# Patient Record
Sex: Female | Born: 1940 | Race: Black or African American | Hispanic: No | Smoking: Former smoker
Health system: Southern US, Community
[De-identification: ages and names within clinical notes are randomized; demographics above are authoritative.]

## PROBLEM LIST (undated history)

## (undated) DIAGNOSIS — I255 Ischemic cardiomyopathy: Secondary | ICD-10-CM

## (undated) DIAGNOSIS — I10 Essential (primary) hypertension: Secondary | ICD-10-CM

## (undated) DIAGNOSIS — J438 Other emphysema: Secondary | ICD-10-CM

## (undated) DIAGNOSIS — I2510 Atherosclerotic heart disease of native coronary artery without angina pectoris: Secondary | ICD-10-CM

## (undated) DIAGNOSIS — I5022 Chronic systolic (congestive) heart failure: Secondary | ICD-10-CM

## (undated) DIAGNOSIS — E785 Hyperlipidemia, unspecified: Secondary | ICD-10-CM

## (undated) DIAGNOSIS — D75829 Heparin-induced thrombocytopenia, unspecified: Secondary | ICD-10-CM

## (undated) DIAGNOSIS — D7582 Heparin induced thrombocytopenia (HIT): Secondary | ICD-10-CM

## (undated) HISTORY — DX: Essential (primary) hypertension: I10

## (undated) HISTORY — DX: Hyperlipidemia, unspecified: E78.5

## (undated) HISTORY — DX: Ischemic cardiomyopathy: I25.5

## (undated) HISTORY — DX: Atherosclerotic heart disease of native coronary artery without angina pectoris: I25.10

## (undated) HISTORY — DX: Heparin induced thrombocytopenia (HIT): D75.82

## (undated) HISTORY — DX: Heparin-induced thrombocytopenia, unspecified: D75.829

## (undated) HISTORY — DX: Chronic systolic (congestive) heart failure: I50.22

## (undated) HISTORY — DX: Other emphysema: J43.8

---

## 1998-04-23 ENCOUNTER — Emergency Department (HOSPITAL_COMMUNITY): Admission: EM | Admit: 1998-04-23 | Discharge: 1998-04-23 | Payer: Self-pay | Admitting: Emergency Medicine

## 1998-04-24 ENCOUNTER — Encounter: Admission: RE | Admit: 1998-04-24 | Discharge: 1998-07-23 | Payer: Self-pay | Admitting: Internal Medicine

## 1999-08-28 ENCOUNTER — Ambulatory Visit (HOSPITAL_COMMUNITY): Admission: RE | Admit: 1999-08-28 | Discharge: 1999-08-28 | Payer: Self-pay | Admitting: Family Medicine

## 2002-09-16 ENCOUNTER — Emergency Department (HOSPITAL_COMMUNITY): Admission: EM | Admit: 2002-09-16 | Discharge: 2002-09-16 | Payer: Self-pay | Admitting: Emergency Medicine

## 2002-12-25 ENCOUNTER — Encounter: Payer: Self-pay | Admitting: Emergency Medicine

## 2002-12-26 ENCOUNTER — Inpatient Hospital Stay (HOSPITAL_COMMUNITY): Admission: EM | Admit: 2002-12-26 | Discharge: 2002-12-30 | Payer: Self-pay | Admitting: Emergency Medicine

## 2002-12-28 ENCOUNTER — Encounter: Payer: Self-pay | Admitting: Internal Medicine

## 2003-07-17 ENCOUNTER — Emergency Department (HOSPITAL_COMMUNITY): Admission: EM | Admit: 2003-07-17 | Discharge: 2003-07-18 | Payer: Self-pay | Admitting: Emergency Medicine

## 2003-07-18 ENCOUNTER — Encounter: Payer: Self-pay | Admitting: Emergency Medicine

## 2004-07-04 ENCOUNTER — Encounter: Admission: RE | Admit: 2004-07-04 | Discharge: 2004-07-04 | Payer: Self-pay | Admitting: Internal Medicine

## 2004-07-08 ENCOUNTER — Emergency Department (HOSPITAL_COMMUNITY): Admission: EM | Admit: 2004-07-08 | Discharge: 2004-07-08 | Payer: Self-pay | Admitting: *Deleted

## 2007-11-12 ENCOUNTER — Inpatient Hospital Stay (HOSPITAL_COMMUNITY): Admission: EM | Admit: 2007-11-12 | Discharge: 2007-11-29 | Payer: Self-pay | Admitting: Internal Medicine

## 2007-11-12 ENCOUNTER — Ambulatory Visit: Payer: Self-pay | Admitting: Cardiovascular Disease

## 2007-11-15 ENCOUNTER — Encounter (INDEPENDENT_AMBULATORY_CARE_PROVIDER_SITE_OTHER): Payer: Self-pay | Admitting: Internal Medicine

## 2007-11-18 ENCOUNTER — Ambulatory Visit: Payer: Self-pay | Admitting: Thoracic Surgery (Cardiothoracic Vascular Surgery)

## 2007-11-18 ENCOUNTER — Encounter (INDEPENDENT_AMBULATORY_CARE_PROVIDER_SITE_OTHER): Payer: Self-pay | Admitting: Cardiology

## 2007-11-18 ENCOUNTER — Encounter: Payer: Self-pay | Admitting: Cardiology

## 2007-11-21 ENCOUNTER — Encounter: Payer: Self-pay | Admitting: Thoracic Surgery (Cardiothoracic Vascular Surgery)

## 2007-11-21 HISTORY — PX: CORONARY ARTERY BYPASS GRAFT: SHX141

## 2007-12-07 ENCOUNTER — Ambulatory Visit: Payer: Self-pay | Admitting: Thoracic Surgery (Cardiothoracic Vascular Surgery)

## 2007-12-09 ENCOUNTER — Ambulatory Visit: Payer: Self-pay | Admitting: Thoracic Surgery (Cardiothoracic Vascular Surgery)

## 2007-12-12 ENCOUNTER — Encounter
Admission: RE | Admit: 2007-12-12 | Discharge: 2007-12-12 | Payer: Self-pay | Admitting: Thoracic Surgery (Cardiothoracic Vascular Surgery)

## 2007-12-12 ENCOUNTER — Ambulatory Visit: Payer: Self-pay | Admitting: Thoracic Surgery (Cardiothoracic Vascular Surgery)

## 2007-12-22 ENCOUNTER — Ambulatory Visit: Payer: Self-pay | Admitting: Cardiovascular Disease

## 2007-12-22 LAB — CONVERTED CEMR LAB
Calcium: 8.8 mg/dL (ref 8.4–10.5)
Chloride: 104 meq/L (ref 96–112)
GFR calc Af Amer: 92 mL/min
GFR calc non Af Amer: 76 mL/min
Sodium: 141 meq/L (ref 135–145)

## 2007-12-26 ENCOUNTER — Ambulatory Visit: Payer: Self-pay | Admitting: Thoracic Surgery (Cardiothoracic Vascular Surgery)

## 2008-01-20 ENCOUNTER — Ambulatory Visit: Payer: Self-pay | Admitting: Cardiovascular Disease

## 2008-01-20 LAB — CONVERTED CEMR LAB
ALT: 32 units/L (ref 0–35)
AST: 32 units/L (ref 0–37)
Albumin: 3.5 g/dL (ref 3.5–5.2)
Alkaline Phosphatase: 112 units/L (ref 39–117)
BUN: 14 mg/dL (ref 6–23)
Bilirubin, Direct: 0.1 mg/dL (ref 0.0–0.3)
CO2: 29 meq/L (ref 19–32)
Cholesterol: 104 mg/dL (ref 0–200)
GFR calc Af Amer: 80 mL/min
Glucose, Bld: 102 mg/dL — ABNORMAL HIGH (ref 70–99)
Potassium: 4.4 meq/L (ref 3.5–5.1)
Total Protein: 7.5 g/dL (ref 6.0–8.3)
VLDL: 27 mg/dL (ref 0–40)

## 2008-01-23 ENCOUNTER — Ambulatory Visit: Payer: Self-pay | Admitting: Cardiovascular Disease

## 2008-04-30 ENCOUNTER — Encounter: Payer: Self-pay | Admitting: Cardiovascular Disease

## 2008-04-30 ENCOUNTER — Ambulatory Visit: Payer: Self-pay | Admitting: Cardiovascular Disease

## 2008-04-30 ENCOUNTER — Ambulatory Visit: Payer: Self-pay

## 2008-05-29 ENCOUNTER — Ambulatory Visit: Payer: Self-pay | Admitting: Cardiovascular Disease

## 2008-05-29 LAB — CONVERTED CEMR LAB
BUN: 16 mg/dL (ref 6–23)
Calcium: 9 mg/dL (ref 8.4–10.5)
Chloride: 105 meq/L (ref 96–112)
Creatinine, Ser: 1 mg/dL (ref 0.4–1.2)
GFR calc Af Amer: 71 mL/min
GFR calc non Af Amer: 58 mL/min

## 2008-06-12 ENCOUNTER — Emergency Department (HOSPITAL_COMMUNITY): Admission: EM | Admit: 2008-06-12 | Discharge: 2008-06-12 | Payer: Self-pay | Admitting: Emergency Medicine

## 2008-06-14 ENCOUNTER — Emergency Department (HOSPITAL_COMMUNITY): Admission: EM | Admit: 2008-06-14 | Discharge: 2008-06-14 | Payer: Self-pay | Admitting: Emergency Medicine

## 2008-08-20 ENCOUNTER — Ambulatory Visit: Payer: Self-pay | Admitting: Cardiovascular Disease

## 2008-08-20 LAB — CONVERTED CEMR LAB
AST: 21 units/L (ref 0–37)
Albumin: 3.3 g/dL — ABNORMAL LOW (ref 3.5–5.2)
Alkaline Phosphatase: 99 units/L (ref 39–117)
BUN: 16 mg/dL (ref 6–23)
Bilirubin, Direct: 0.1 mg/dL (ref 0.0–0.3)
CO2: 28 meq/L (ref 19–32)
Chloride: 109 meq/L (ref 96–112)
Cholesterol: 165 mg/dL (ref 0–200)
GFR calc Af Amer: 71 mL/min
Glucose, Bld: 119 mg/dL — ABNORMAL HIGH (ref 70–99)
Potassium: 4.2 meq/L (ref 3.5–5.1)
Sodium: 143 meq/L (ref 135–145)
Total Protein: 7.3 g/dL (ref 6.0–8.3)

## 2008-08-23 ENCOUNTER — Ambulatory Visit: Payer: Self-pay

## 2008-10-18 ENCOUNTER — Encounter: Admission: RE | Admit: 2008-10-18 | Discharge: 2008-10-18 | Payer: Self-pay | Admitting: Internal Medicine

## 2008-11-25 DIAGNOSIS — E785 Hyperlipidemia, unspecified: Secondary | ICD-10-CM

## 2008-11-25 DIAGNOSIS — I10 Essential (primary) hypertension: Secondary | ICD-10-CM | POA: Insufficient documentation

## 2008-11-25 DIAGNOSIS — I2510 Atherosclerotic heart disease of native coronary artery without angina pectoris: Secondary | ICD-10-CM | POA: Insufficient documentation

## 2009-03-13 ENCOUNTER — Ambulatory Visit: Payer: Self-pay | Admitting: Pulmonary Disease

## 2009-03-13 DIAGNOSIS — R0602 Shortness of breath: Secondary | ICD-10-CM

## 2009-03-21 ENCOUNTER — Encounter: Payer: Self-pay | Admitting: Cardiovascular Disease

## 2009-03-21 ENCOUNTER — Ambulatory Visit: Payer: Self-pay | Admitting: Cardiovascular Disease

## 2009-03-29 ENCOUNTER — Encounter (INDEPENDENT_AMBULATORY_CARE_PROVIDER_SITE_OTHER): Payer: Self-pay

## 2009-04-04 ENCOUNTER — Ambulatory Visit: Payer: Self-pay | Admitting: Pulmonary Disease

## 2009-04-11 ENCOUNTER — Encounter: Payer: Self-pay | Admitting: Cardiovascular Disease

## 2009-04-12 ENCOUNTER — Telehealth (INDEPENDENT_AMBULATORY_CARE_PROVIDER_SITE_OTHER): Payer: Self-pay | Admitting: *Deleted

## 2009-04-15 ENCOUNTER — Ambulatory Visit: Payer: Self-pay | Admitting: Pulmonary Disease

## 2009-04-15 DIAGNOSIS — J449 Chronic obstructive pulmonary disease, unspecified: Secondary | ICD-10-CM

## 2009-05-13 ENCOUNTER — Encounter: Payer: Self-pay | Admitting: Cardiovascular Disease

## 2009-07-11 ENCOUNTER — Ambulatory Visit: Payer: Self-pay | Admitting: Pulmonary Disease

## 2009-07-14 ENCOUNTER — Encounter: Payer: Self-pay | Admitting: Cardiovascular Disease

## 2009-08-15 ENCOUNTER — Ambulatory Visit: Payer: Self-pay | Admitting: Cardiovascular Disease

## 2009-08-15 DIAGNOSIS — I5022 Chronic systolic (congestive) heart failure: Secondary | ICD-10-CM | POA: Insufficient documentation

## 2009-08-26 ENCOUNTER — Ambulatory Visit: Payer: Self-pay | Admitting: Cardiovascular Disease

## 2009-08-27 LAB — CONVERTED CEMR LAB
ALT: 17 units/L (ref 0–35)
AST: 16 units/L (ref 0–37)
Bilirubin, Direct: 0.1 mg/dL (ref 0.0–0.3)
HDL: 28.1 mg/dL — ABNORMAL LOW (ref >39.00)
Total Bilirubin: 0.5 mg/dL (ref 0.3–1.2)
Total CHOL/HDL Ratio: 4

## 2009-09-13 ENCOUNTER — Encounter: Payer: Self-pay | Admitting: Cardiovascular Disease

## 2009-10-15 IMAGING — CR DG CHEST 2V
2 series · 2 of 2 positions shown · non-contrast
Comparison: Yesterday?s portable.

CLINICAL DATA: Asthma/post CABG.
 CHEST - 2 VIEW:

[w chest pa]
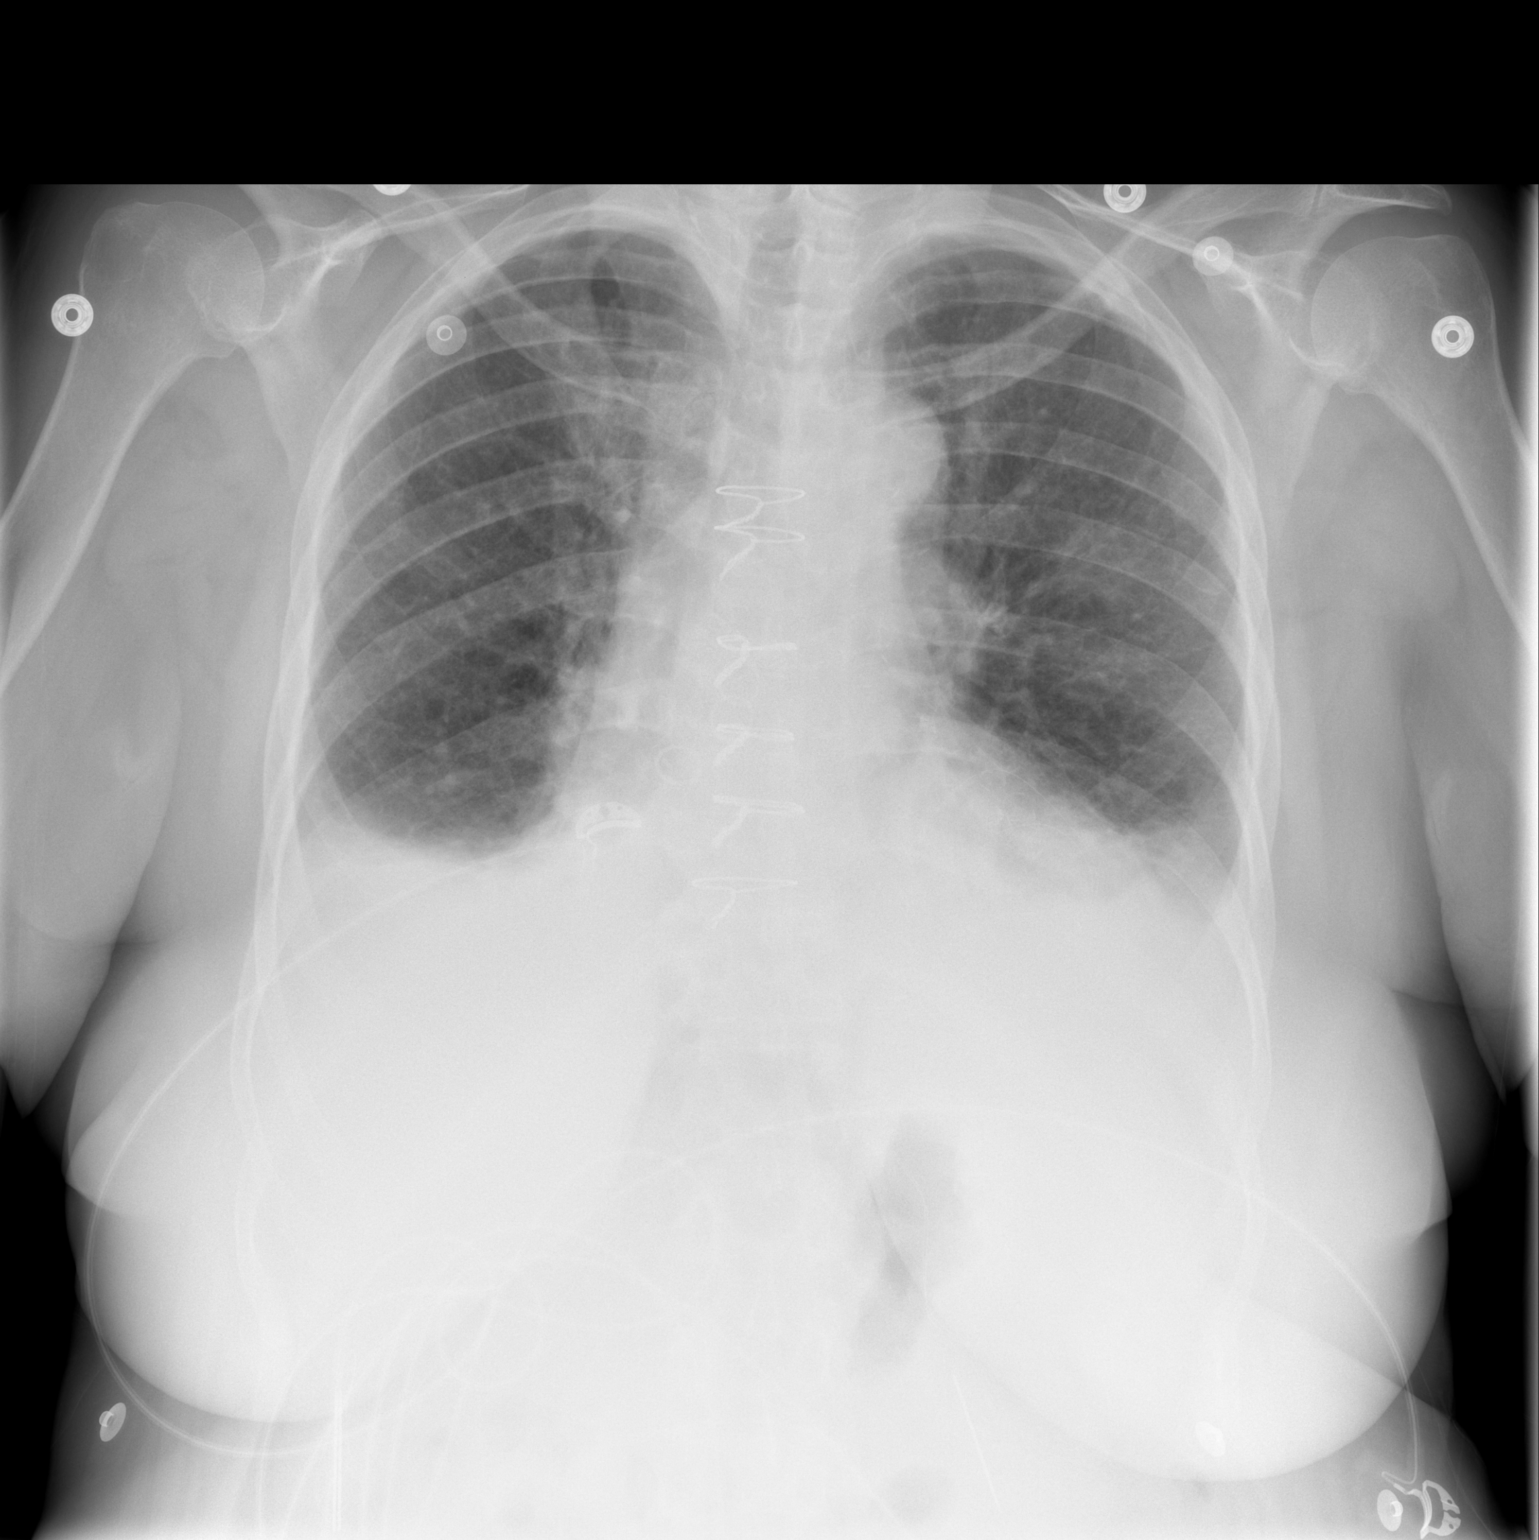

[w chest lat]
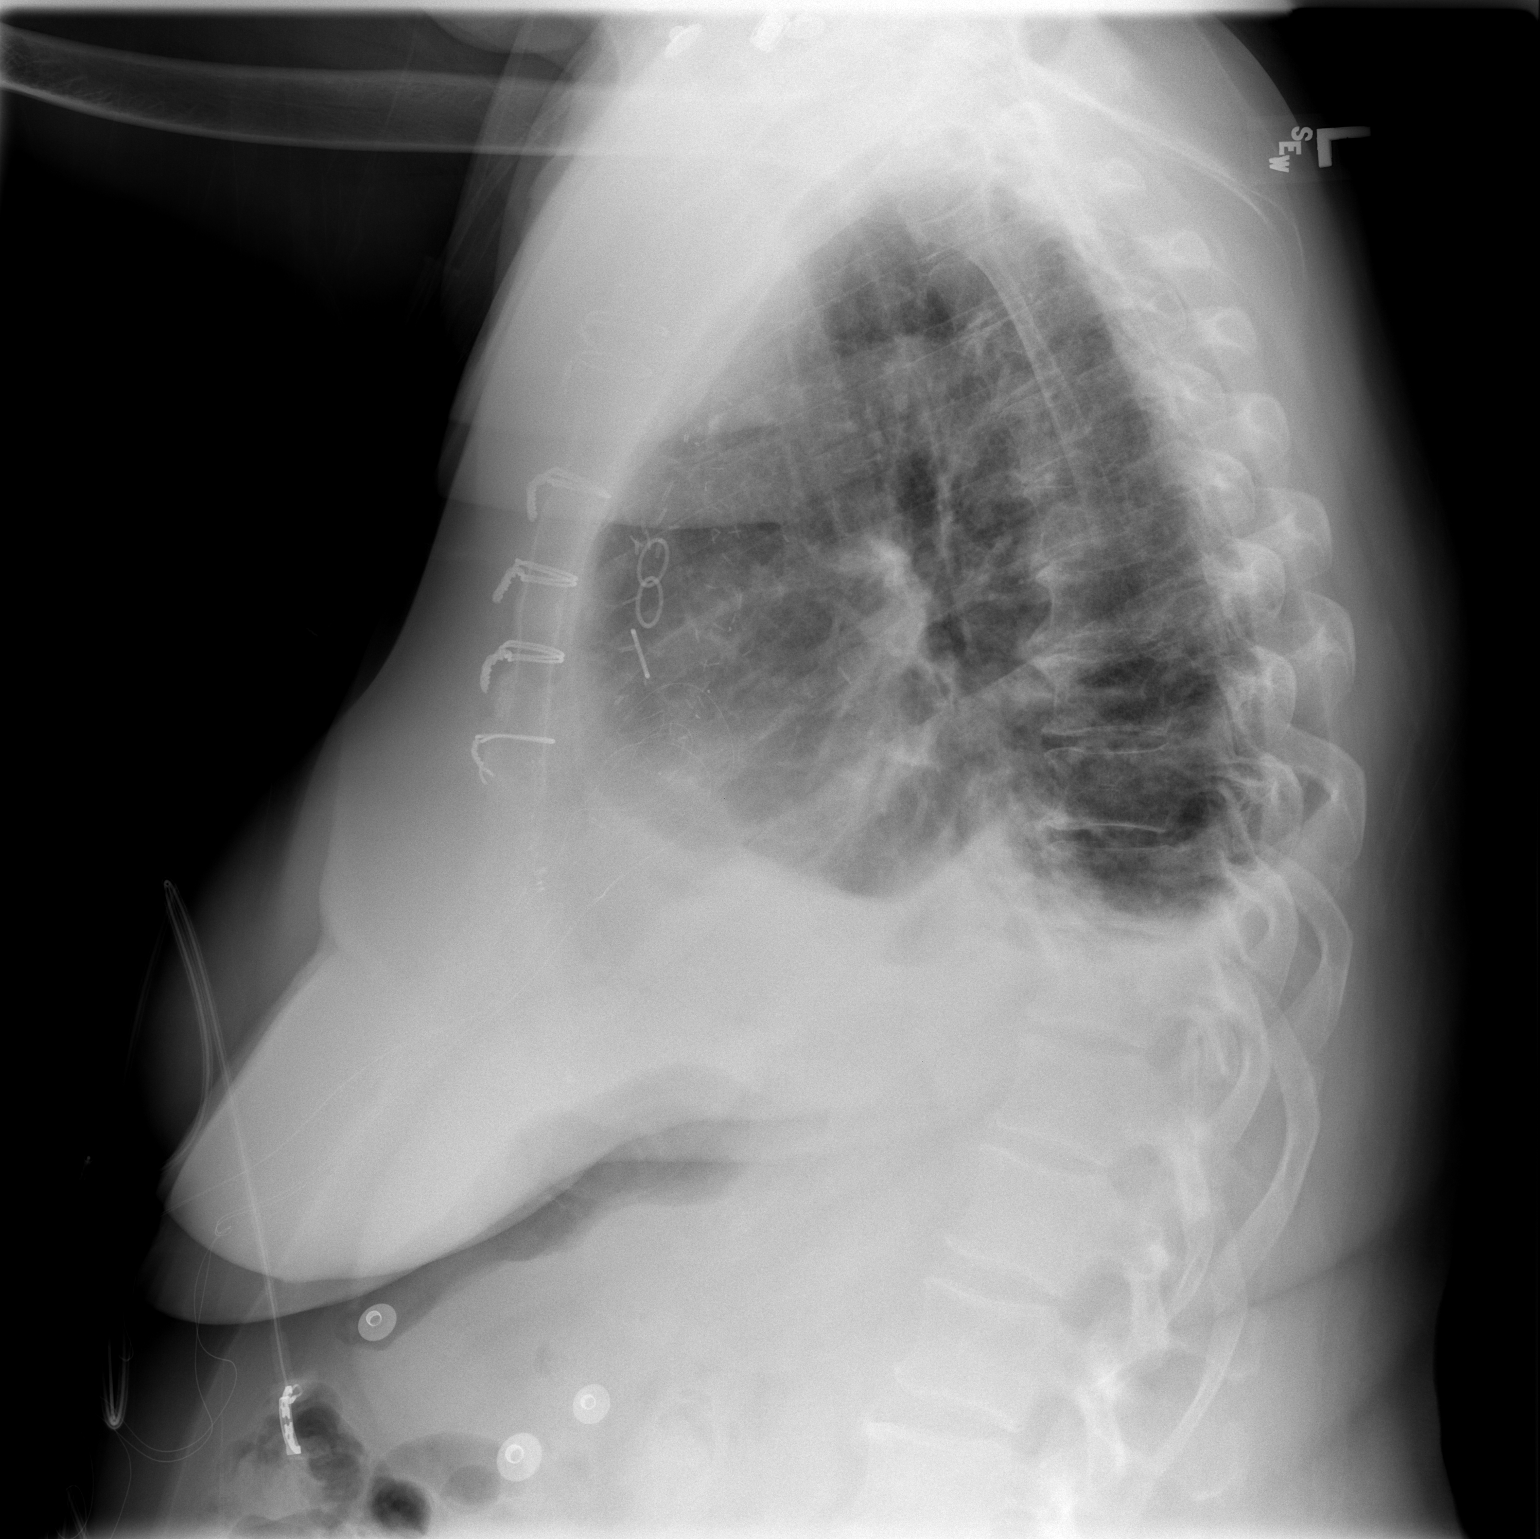

[2 of 2 positions shown; findings below may reference images not displayed]

FINDINGS: There is bibasilar atelectasis with small effusions which are more apparent with the patient upright compared to yesterday?s semi-erect portable.
 The heart appears mildly enlarged, but there is no definite heart failure.  No pneumothorax.
IMPRESSION: Postoperative bibasilar atelectasis and pleural effusions.

## 2010-01-07 ENCOUNTER — Ambulatory Visit: Payer: Self-pay | Admitting: Pulmonary Disease

## 2010-02-25 ENCOUNTER — Ambulatory Visit: Payer: Self-pay | Admitting: Cardiovascular Disease

## 2010-02-28 ENCOUNTER — Ambulatory Visit: Payer: Self-pay | Admitting: Cardiovascular Disease

## 2010-03-03 LAB — CONVERTED CEMR LAB
ALT: 14 units/L (ref 0–35)
AST: 16 units/L (ref 0–37)
Bilirubin, Direct: 0.1 mg/dL (ref 0.0–0.3)
LDL Cholesterol: 68 mg/dL (ref 0–99)
Total Bilirubin: 0.7 mg/dL (ref 0.3–1.2)
Total CHOL/HDL Ratio: 4
Total Protein: 6.9 g/dL (ref 6.0–8.3)

## 2010-03-04 ENCOUNTER — Encounter: Payer: Self-pay | Admitting: Cardiovascular Disease

## 2010-04-17 ENCOUNTER — Ambulatory Visit: Payer: Self-pay | Admitting: Pulmonary Disease

## 2010-04-19 ENCOUNTER — Encounter: Payer: Self-pay | Admitting: Cardiovascular Disease

## 2010-04-23 ENCOUNTER — Encounter: Payer: Self-pay | Admitting: Cardiovascular Disease

## 2010-07-09 ENCOUNTER — Ambulatory Visit: Payer: Self-pay | Admitting: Pulmonary Disease

## 2010-08-12 ENCOUNTER — Emergency Department (HOSPITAL_COMMUNITY): Admission: EM | Admit: 2010-08-12 | Discharge: 2010-08-12 | Payer: Self-pay | Admitting: Emergency Medicine

## 2010-09-10 ENCOUNTER — Encounter: Payer: Self-pay | Admitting: Cardiovascular Disease

## 2010-09-10 ENCOUNTER — Ambulatory Visit: Payer: Self-pay | Admitting: Cardiovascular Disease

## 2010-11-09 ENCOUNTER — Encounter: Payer: Self-pay | Admitting: Internal Medicine

## 2010-11-09 ENCOUNTER — Encounter: Payer: Self-pay | Admitting: Thoracic Surgery (Cardiothoracic Vascular Surgery)

## 2010-11-18 NOTE — Assessment & Plan Note (Signed)
Summary: rov for emphysema   Copy to:  Fleet Contras Primary Provider/Referring Provider:  Fleet Contras  CC:  Pt is here for a 6 month f/u appt.  Pt states breathing is unchanged from last visit.  Pt denied a cough and wheezing or tightness in chest.  .  History of Present Illness: the pt comes in today for f/u of her known emphysema.  She is maintaining on her current bronchodilator regimen, and feels that her breathing is stable from the last visit.  She has no cough or congestion.  Medications Prior to Update: 1)  Ventolin Hfa 108 (90 Base) Mcg/act  Aers (Albuterol Sulfate) .[name].. 1-2 Puffs Every 4-6 Hours As Needed 2)  Propoxyphene N-Apap 100-650 Mg Tabs (Propoxyphene N-Apap) .... Take By Mouth As Needed 3)  Symbicort 160-4.5 Mcg/act Aero (Budesonide-Formoterol Fumarate) .... Inhale 2 Puff Two Times A Day 4)  Lisinopril 20 Mg Tabs (Lisinopril) .... Take 1 Tablet By Mouth Once A Day 5)  Simvastatin 40 Mg Tabs (Simvastatin) .... Take 1 Tablet By Mouth Once A Day 6)  Furosemide 40 Mg Tabs (Furosemide) .... Take 1 Tablet By Mouth Once A Day 7)  Klor-Con M10 10 Meq Cr-Tabs (Potassium Chloride Crys Cr) .... Take 1 Tablet By Mouth Once A Day 8)  Carvedilol 6.25 Mg Tabs (Carvedilol) .... Take 1 Tablet By Mouth Two Times A Day 9)  Vitamin D (Ergocalciferol) 50000 Unit Caps (Ergocalciferol) .... Take 1 Tab By Mouth Once A Week 10)  Multivitamins  Tabs (Multiple Vitamin) .... Take 1 Tablet By Mouth Once A Day 11)  Aspirin 81 Mg Tbec (Aspirin) .... Take One Tablet By Mouth Daily 12)  Fish Oil 1000 Mg Caps (Omega-3 Fatty Acids) .... Take 1 Tablet By Mouth Three Times A Day 13)  Spiriva Handihaler 18 Mcg  Caps (Tiotropium Bromide Monohydrate) .... One Inhalation in Handihaler Daily 14)  Iron 325 (65 Fe) Mg Tabs (Ferrous Sulfate) .... Take 1 Tablet By Mouth Once A Day  Allergies (verified): No Known Drug Allergies  Review of Systems      See HPI  Vital Signs:  Patient profile:   70 year old  female Height:      65 inches Weight:      222.25 pounds BMI:     37.12 O2 Sat:      95 % on Room air Temp:     98.2 degrees F oral Pulse rate:   84 / minute BP sitting:   130 / 82  (left arm) Cuff size:   regular  Vitals Entered By: Arman Filter LPN (January 07, 2010 11:16 AM)  O2 Flow:  Room air CC: Pt is here for a 6 month f/u appt.  Pt states breathing is unchanged from last visit.  Pt denied a cough, wheezing or tightness in chest.   Comments Medications reviewed with patient Arman Filter LPN  January 07, 2010 11:16 AM    Physical Exam  General:  obese female in nad Lungs:  mild bibasilar crackles, no wheezing or rhonchi Heart:  rrr Extremities:  no edema or cyanosis   Impression & Recommendations:  Problem # 1:  EMPHYSEMA (ICD-492.8)  the pt has moderate disease by pfts, and seems to be doing well on her current bronchodilator regimen.  I have asked her to stay on current meds, and to work aggressively on weight loss and conditioning.  I suspect the latter will do more for her breathing than any of her meds.    Other Orders: Est.  Patient Level II (57846)  Patient Instructions: 1)  no change in meds 2)  continue to work on weight loss and conditioning program 3)  followup with me in 6mos.   Immunization History:  Pneumovax Immunization History:    Pneumovax:  historical (09/12/2008)

## 2010-11-18 NOTE — Letter (Signed)
Summary: [facility]  [facility], Main Office  [address]   [place], [place] [address]   Phone: [phone]  Fax: [phone]     Mar 04, 2010 MRN: [id number]   [name] [address] [place], [place]  [address]   Dear Ms. Trull,  We have reviewed your cholesterol results.  They are as follows:     Total Cholesterol:    122 (Desirable: less than 200)       HDL  Cholesterol:     29.00  (Desirable: greater than 40 for men and 50 for women)       LDL Cholesterol:       68  (Desirable: less than 100 for low risk and less than 70 for moderate to high risk)       Triglycerides:       125.0  (Desirable: less than 150)  Our recommendations include: Your cholesterol numbers are at goal.  Liver function is okay.  Dr Excell Seltzer recommends repeating labwork in one year.   Call our office at the number listed above if you have any questions.  Lowering your LDL cholesterol is important, but it is only one of a large number of "risk factors" that may indicate that you are at risk for heart disease, stroke or other complications of hardening of the arteries.  Other risk factors include:   A.  Cigarette Smoking* B.  High Blood Pressure* C.  Obesity* D.   Low HDL Cholesterol (see yours above)* E.   Diabetes Mellitus (higher risk if your is uncontrolled) F.  Family history of premature heart disease G.  Previous history of stroke or cardiovascular disease    *These are risk factors YOU HAVE CONTROL OVER.  For more information, visit .  There is now evidence that lowering the TOTAL CHOLESTEROL AND LDL CHOLESTEROL can reduce the risk of heart disease.  The American Heart Association recommends the following guidelines for the treatment of elevated cholesterol:  1.  If there is now current heart disease and less than two risk factors, TOTAL CHOLESTEROL should be less than 200 and LDL CHOLESTEROL should be less than 100. 2.  If there is current heart disease or two or  more risk factors, TOTAL CHOLESTEROL should be less than 200 and LDL CHOLESTEROL should be less than 70.  A diet low in cholesterol, saturated fat, and calories is the cornerstone of treatment for elevated cholesterol.  Cessation of smoking and exercise are also important in the management of elevated cholesterol and preventing vascular disease.  Studies have shown that 30 to 60 minutes of physical activity most days can help lower blood pressure, lower cholesterol, and keep your weight at a healthy level.  Drug therapy is used when cholesterol levels do not respond to therapeutic lifestyle changes (smoking cessation, diet, and exercise) and remains unacceptably high.  If medication is started, it is important to have you levels checked periodically to evaluate the need for further treatment options.  Thank you,    [facility] [employer] Team

## 2010-11-18 NOTE — Letter (Signed)
Summary: [facility]  [facility]   Imported By: Marylou Mccoy 05/27/2010 11:05:21  _____________________________________________________________________  External Attachment:    Type:   Image     Comment:   External Document

## 2010-11-18 NOTE — Assessment & Plan Note (Signed)
Summary: acute sick visit for dyspnea   Copy to:  Fleet Contras Primary Provider/Referring Provider:  Fleet Contras  CC:  Pt is here for an acute sick visit.    Pt c/o increased sob with exertion and at rest x approx 1 month.  .  History of Present Illness: the pt comes in today for an acute sick visit.  She  has chronic doe related to her moderate emphysema, ischemic CM, and morbid obesity with deconditioning.  She notes worsening sob to the point that she can only walk short distances at a time, and even has sob on occasion at rest.  She denies any cough or congestion, and has been watching her fluid balance carefully.  She is maintaining stable weight, and has not seen LE edema.  She has not had wheezing or chest tightness.  Medications Prior to Update: 1)  Ventolin Hfa 108 (90 Base) Mcg/act  Aers (Albuterol Sulfate) .[name].. 1-2 Puffs Every 4-6 Hours As Needed 2)  Propoxyphene N-Apap 100-650 Mg Tabs (Propoxyphene N-Apap) .... Take By Mouth As Needed 3)  Symbicort 160-4.5 Mcg/act Aero (Budesonide-Formoterol Fumarate) .... Inhale 2 Puff Two Times A Day 4)  Lisinopril 20 Mg Tabs (Lisinopril) .... Take 1 Tablet By Mouth Once A Day 5)  Simvastatin 40 Mg Tabs (Simvastatin) .... Take 1 Tablet By Mouth Once A Day 6)  Furosemide 40 Mg Tabs (Furosemide) .... Take 1 Tablet By Mouth Once A Day 7)  Klor-Con M10 10 Meq Cr-Tabs (Potassium Chloride Crys Cr) .... Take 1 Tablet By Mouth Once A Day 8)  Carvedilol 6.25 Mg Tabs (Carvedilol) .... Take 1 Tablet By Mouth Two Times A Day 9)  Vitamin D (Ergocalciferol) 50000 Unit Caps (Ergocalciferol) .... Take 1 Tab By Mouth Once A Week 10)  Multivitamins  Tabs (Multiple Vitamin) .... Take 1 Tablet By Mouth Once A Day 11)  Aspirin 81 Mg Tbec (Aspirin) .... Take One Tablet By Mouth Daily 12)  Fish Oil 1000 Mg Caps (Omega-3 Fatty Acids) .... Take 1 Tablet By Mouth Three Times A Day 13)  Spiriva Handihaler 18 Mcg  Caps (Tiotropium Bromide Monohydrate) .... One  Inhalation in Handihaler Daily 14)  Iron 325 (65 Fe) Mg Tabs (Ferrous Sulfate) .... Take 1 Tablet By Mouth Once A Day  Allergies (verified): No Known Drug Allergies  Past History:  Past medical, surgical, family and social histories (including risk factors) reviewed, and no changes noted (except as noted below).  Past Medical History: Reviewed history from 11/25/2008 and no changes required. 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  Past Surgical History: Reviewed history from 11/25/2008 and no changes required. 5 vessel CABG November 21, 2007.  Family History: Reviewed history from 03/13/2009 and no changes required. mother had hypertension and diabetes and asthma, and rheumatism.  No coronary artery disease.  Social History: Reviewed history from 03/13/2009 and no changes required. lives alone in [place]. Children live nearby Negative alcohol pt is separated.  pt does not currently work.   Patient states former smoker. started at age 39.  1 ppd.  quit 2009  Review of Systems       The patient complains of shortness of breath with activity, shortness of breath at rest, acid heartburn, indigestion, nasal congestion/difficulty breathing through nose, and hand/feet swelling.  The patient denies productive cough, non-productive cough, coughing up blood, chest pain, irregular heartbeats, loss of appetite, weight change, abdominal pain, difficulty swallowing, sore throat, tooth/dental problems, headaches, sneezing,  itching, ear ache, anxiety, depression, joint stiffness or pain, rash, change in color of mucus, and fever.    Vital Signs:  Patient profile:   70 year old female Height:      65 inches Weight:      216 pounds BMI:     36.07 O2 Sat:      94 % on Room air Temp:     98.2 degrees F oral Pulse rate:   87 / minute BP sitting:   118 / 64  (right arm) Cuff size:    large  Vitals Entered By: Arman Filter LPN (April 17, 2010 10:20 AM)  O2 Flow:  Room air  Serial Vital Signs/Assessments:  Comments: 11:10 AM Ambulatory Pulse Oximetry  Resting; HR_87____    02 Sat__97%ra___  Lap1 (185 feet)   HR__92___   02 Sat__92%ra___ Lap2 (185 feet)   HR__108__   02 Sat___91%ra__    Lap3 (185 feet)   HR_____   02 Sat_____  ___Test Completed without Difficulty __X_Test Stopped due to:  pt c/o increased sob. Arman Filter LPN  April 17, 2010 11:11 AM    By: Arman Filter LPN   CC: Pt is here for an acute sick visit.    Pt c/o increased sob with exertion and at rest x approx 1 month.   Comments Medications reviewed with patient Arman Filter LPN  April 17, 2010 10:24 AM    Physical Exam  General:  obese female in nad Nose:  patent without discharge Mouth:  clear Lungs:  faint basilar crackles, no wheezing or rhonchi mildly decreased airflow Heart:  rrr Extremities:  no edema or cyanosis Neurologic:  alert and oriented,moves all 4.   Impression & Recommendations:  Problem # 1:  DYSPNEA (ICD-786.05) the pt feels that her sob is worsening, but does not have a lot of other pulmonary symptoms such as cough, congestion, mucus.  This may all be related to her deconditioining, or possibly her known CM.  She appears to be euvolumic today by exam though.  Will give her a short course of prednisone to see if it will make a difference.  If it does not, would check BNP, and get her back to cardiology for evaluation.  Problem # 2:  EMPHYSEMA (ICD-492.8) the pt has known moderate emphysema, but has not had an acute exacerbation since the last visit.  It is unclear whether this is the cause of her current worsening symptomatology.  Medications Added to Medication List This Visit: 1)  Prednisone 10 Mg Tabs (Prednisone) .... Take 4 each day for 2 days, then 1 1/2 each day for 2 days, then one each day for 2 days, then stop  Other Orders: Est. Patient Level  IV (99214) Pulse Oximetry, Ambulatory ([address])  Patient Instructions: 1)  continue current breathing meds.   2)  will try you on a short course of prednisone to see if it will help your breathing. 3)  work hard on weight loss and some type of exercise program. 4)  keep previously scheduled followup with me .   Prescriptions: PREDNISONE 10 MG  TABS (PREDNISONE) take 4 each day for 2 days, then 1 1/2 each day for 2 days, then one each day for 2 days, then stop  #qs x 0   Entered and Authorized by:   Barbaraann Share MD   Signed by:   Barbaraann Share MD on 04/17/2010   Method used:   Print then Give to Patient   RxID:  [id number]  

## 2010-11-18 NOTE — Cardiovascular Report (Signed)
Summary: Heart Failure Program Summary Report   Heart Failure Program Summary Report   Imported By: Roderic Ovens 07/21/2010 12:26:01  _____________________________________________________________________  External Attachment:    Type:   Image     Comment:   External Document

## 2010-11-18 NOTE — Assessment & Plan Note (Signed)
Summary: rov for emphysema   Copy to:  Fleet Contras Primary Zylan Almquist/Referring Aislyn Hayse:  Fleet Contras  CC:  6 month follow up. pt states her breahting is about the same from last visit. Pt states she needs refills on her breathing medications. Pt states she is having some difficulty breathing this morning Pt states she has not been smoking since jan 2009.  History of Present Illness: the pt comes in today for f/u of her known emphysema.  She is maintaining on her usual bronchodilator regimen, and feels that overall she is doing fairly well.  She has a little more restriction to airflow related to the change in temperature, and also due to postnasal drip.  However, has had no cough, congestion, or purulence.  Current Medications (verified): 1)  Ventolin Hfa 108 (90 Base) Mcg/act  Aers (Albuterol Sulfate) .[name].. 1-2 Puffs Every 4-6 Hours As Needed 2)  Propoxyphene N-Apap 100-650 Mg Tabs (Propoxyphene N-Apap) .... Take By Mouth As Needed 3)  Symbicort 160-4.5 Mcg/act Aero (Budesonide-Formoterol Fumarate) .... Inhale 2 Puff Two Times A Day 4)  Lisinopril 20 Mg Tabs (Lisinopril) .... Take 1 Tablet By Mouth Once A Day 5)  Simvastatin 40 Mg Tabs (Simvastatin) .... Take 1 Tablet By Mouth Once A Day 6)  Furosemide 40 Mg Tabs (Furosemide) .... Take 1 Tablet By Mouth Once A Day 7)  Klor-Con M10 10 Meq Cr-Tabs (Potassium Chloride Crys Cr) .... Take 1 Tablet By Mouth Once A Day 8)  Carvedilol 6.25 Mg Tabs (Carvedilol) .... Take 1 Tablet By Mouth Two Times A Day 9)  Vitamin D (Ergocalciferol) 50000 Unit Caps (Ergocalciferol) .... Take 1 Tab By Mouth Once A Week 10)  Multivitamins  Tabs (Multiple Vitamin) .... Take 1 Tablet By Mouth Once A Day 11)  Aspirin 81 Mg Tbec (Aspirin) .... Take One Tablet By Mouth Daily 12)  Fish Oil 1000 Mg Caps (Omega-3 Fatty Acids) .... Take 1 Tablet By Mouth Three Times A Day 13)  Spiriva Handihaler 18 Mcg  Caps (Tiotropium Bromide Monohydrate) .... One Inhalation in  Handihaler Daily 14)  Iron 325 (65 Fe) Mg Tabs (Ferrous Sulfate) .... Take 1 Tablet By Mouth Once A Day 15)  Colcrys 0.6 Mg Tabs (Colchicine) .... Take 1 Tab Two Times A Day As Needed 16)  Allopurinol 100 Mg Tabs (Allopurinol) .... Take 1 Tab Once Daily  Allergies (verified): No Known Drug Allergies  Review of Systems       The patient complains of shortness of breath with activity, shortness of breath at rest, acid heartburn, difficulty swallowing, nasal congestion/difficulty breathing through nose, and joint stiffness or pain.  The patient denies productive cough, non-productive cough, coughing up blood, chest pain, indigestion, loss of appetite, weight change, abdominal pain, sore throat, tooth/dental problems, headaches, sneezing, itching, ear ache, anxiety, depression, hand/feet swelling, rash, change in color of mucus, and fever.    Vital Signs:  Patient profile:   70 year old female Height:      65 inches Weight:      217.38 pounds BMI:     36.30 O2 Sat:      96 % on Room air Temp:     98.4 degrees F oral Pulse rate:   92 / minute BP sitting:   140 / 82  (right arm) Cuff size:   large  Vitals Entered By: Carver Fila (July 09, 2010 11:03 AM)  O2 Flow:  Room air CC: 6 month follow up. pt states her breahting is about the same from  last visit. Pt states she needs refills on her breathing medications. Pt states she is having some difficulty breathing this morning Pt states she has not been smoking since jan 2009 Comments meds and allergies updated Phone number updated Carver Fila  July 09, 2010 11:06 AM    Physical Exam  General:  ow female in nad Lungs:  minimal decrease in bs, no wheezing or rhonchi Heart:  rrr, 2/6 sem Extremities:  no edema or cyanosis  Neurologic:  alert and oriented, moves all 4.   Impression & Recommendations:  Problem # 1:  EMPHYSEMA (ICD-492.8)  the pt is doing well overall, and has had no recent chest infection or acute  exacerbation.  I have asked her to stay on her current meds, work on some type of weight loss/conditioning program, and to continue watching her fluid balance closely.  She will followup with me in 6mos, or sooner if having issues.  Problem # 2:  CHRONIC SYSTOLIC HEART FAILURE (ICD-428.22) appears to be euvolemic  Medications Added to Medication List This Visit: 1)  Colcrys 0.6 Mg Tabs (Colchicine) .... Take 1 tab two times a day as needed 2)  Allopurinol 100 Mg Tabs (Allopurinol) .... Take 1 tab once daily  Other Orders: Est. Patient Level III ([address])  Patient Instructions: 1)  continue on current medications for your breathing. 2)  will give you the flu shot today 3)  work on weight loss and conditioning 4)  followup with me in 6mos.    Appended Document: rov for emphysema  Flu Vaccine Consent Questions     Do you have a history of severe allergic reactions to this vaccine? no    Any prior history of allergic reactions to egg and/or gelatin? no    Do you have a sensitivity to the preservative Thimersol? no    Do you have a past history of Guillan-Barre Syndrome? no    Do you currently have an acute febrile illness? no    Have you ever had a severe reaction to latex? no    Vaccine information given and explained to patient? yes    Are you currently pregnant? no    Lot Number:[id number]   Exp Date:04/18/2011   Site Given  Left Deltoid IM  Megan Reynolds LPN  July 09, 2010 11:28 AM   Clinical Lists Changes  Medications: Rx of VENTOLIN HFA 108 (90 BASE) MCG/ACT  AERS (ALBUTEROL SULFATE) 1-2 puffs every 4-6 hours as needed;  #1 x 3;  Signed;  Entered by: Arman Filter LPN;  Authorized by: Barbaraann Share MD;  Method used: Electronically to [facility]  [address] [id number] [address] [place], [place]  [address], Ph: [phone] or [phone], Fax: [phone] Rx of SYMBICORT 160-4.5 MCG/ACT AERO (BUDESONIDE-FORMOTEROL FUMARATE) Inhale 2 puff two times a day;  #1 x  5;  Signed;  Entered by: Arman Filter LPN;  Authorized by: Barbaraann Share MD;  Method used: Electronically to [facility]  [id number] [address] [place], [place]  [address], Ph: [phone] or [phone], Fax: [phone] Rx of SPIRIVA HANDIHALER 18 MCG  CAPS (TIOTROPIUM BROMIDE MONOHYDRATE) one inhalation in handihaler daily;  #30 x 5;  Signed;  Entered by: Arman Filter LPN;  Authorized by: Barbaraann Share MD;  Method used: Electronically to [facility]  [id number] [address] [place], [place]  [address], Ph: [phone] or [phone], Fax: [phone] Orders: Added new Service order of Flu Vaccine 52yrs + MEDICARE PATIENTS [id number]) -  Signed Added new Service order of Administration Flu vaccine - MCR [phone]) - Signed Observations: Added new observation of FLU VAX VIS: 05/13/2010 version (07/09/2010 11:27) Added new observation of FLU VAXLOT: [id number] (07/09/2010 11:27) Added new observation of FLU VAXMFR: [employer] (07/09/2010 11:27) Added new observation of FLU VAX EXP: 04/18/2011 (07/09/2010 11:27) Added new observation of FLU VAX DSE: 0.63ml (07/09/2010 11:27) Added new observation of FLU VAX: Fluvax 3+ (07/09/2010 11:27)    Prescriptions: SPIRIVA HANDIHALER 18 MCG  CAPS (TIOTROPIUM BROMIDE MONOHYDRATE) one inhalation in handihaler daily  #30 x 5   Entered by:   Arman Filter LPN   Authorized by:   Barbaraann Share MD   Signed by:   Arman Filter LPN on 47/82/9562   Method used:   [facility]ronically to        [id number] (retail)       [address]       [place], [place]  [address]       Ph: [phone] or [phone]       Fax: [phone]   RxID:   [id number] SYMBICORT 160-4.5 MCG/ACT AERO (BUDESONIDE-FORMOTEROL FUMARATE) Inhale 2 puff two times a day  #1 x 5   Entered by:   Arman Filter LPN   Authorized by:   Barbaraann Share MD   Signed by:   Arman Filter LPN on 51/88/4166   Method used:    [facility]ronically to        [facility] [id number] (retail[facility]       [address]       [place], [place]  [address]       Ph: [phone] or [phone]       Fax: [phone]   RxID:   [id number] VENTOLIN HFA 108 (90 BASE) MCG/ACT  AERS (ALBUTEROL SULFATE) 1-2 puffs every 4-6 hours as needed  #1 x 3   Entered by:   Arman Filter LPN   Authorized by:   Barbaraann Share MD   Signed by:   Arman Filter LPN on 62/70/3500   Method used:   [facility]ronically to        [id number] ([facility]ail[facility]       [address]       [place], [place]  [address]       Ph: [phone] or [phone]       Fax: [phone]   RxID:   [id number]

## 2010-11-18 NOTE — Assessment & Plan Note (Signed)
Summary: f25m   Visit Type:  Follow-up Referring Provider:  Fleet Contras Primary Provider:  Fleet Contras  CC:  sob.  History of Present Illness: This is a 70 year old woman with ischemic cardiomyopathy, CAD status post CABG, COPD, and chronic dyspnea. She presents today for followup evaluation.   Reports no change in overall symptoms. Continues to have exertional dyspnea with low-level activity, especially in hot weather. She is inactive. No chest pain, edema, orthopnea, PND. No other complaints.    Current Medications (verified): 1)  Ventolin Hfa 108 (90 Base) Mcg/act  Aers (Albuterol Sulfate) .[name].. 1-2 Puffs Every 4-6 Hours As Needed 2)  Propoxyphene N-Apap 100-650 Mg Tabs (Propoxyphene N-Apap) .... Take By Mouth As Needed 3)  Symbicort 160-4.5 Mcg/act Aero (Budesonide-Formoterol Fumarate) .... Inhale 2 Puff Two Times A Day 4)  Lisinopril 20 Mg Tabs (Lisinopril) .... Take 1 Tablet By Mouth Once A Day 5)  Simvastatin 40 Mg Tabs (Simvastatin) .... Take 1 Tablet By Mouth Once A Day 6)  Furosemide 40 Mg Tabs (Furosemide) .... Take 1 Tablet By Mouth Once A Day 7)  Klor-Con M10 10 Meq Cr-Tabs (Potassium Chloride Crys Cr) .... Take 1 Tablet By Mouth Once A Day 8)  Carvedilol 6.25 Mg Tabs (Carvedilol) .... Take 1 Tablet By Mouth Two Times A Day 9)  Vitamin D (Ergocalciferol) 50000 Unit Caps (Ergocalciferol) .... Take 1 Tab By Mouth Once A Week 10)  Multivitamins  Tabs (Multiple Vitamin) .... Take 1 Tablet By Mouth Once A Day 11)  Aspirin 81 Mg Tbec (Aspirin) .... Take One Tablet By Mouth Daily 12)  Fish Oil 1000 Mg Caps (Omega-3 Fatty Acids) .... Take 1 Tablet By Mouth Three Times A Day 13)  Spiriva Handihaler 18 Mcg  Caps (Tiotropium Bromide Monohydrate) .... One Inhalation in Handihaler Daily 14)  Iron 325 (65 Fe) Mg Tabs (Ferrous Sulfate) .... Take 1 Tablet By Mouth Once A Day  Allergies: No Known Drug Allergies  Past History:  Past medical history reviewed for relevance to current  acute and chronic problems.  Past Medical History: Reviewed history from 11/25/2008 and no changes required. 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  Review of Systems       Negative except as per HPI   Vital Signs:  Patient profile:   70 year old female Height:      65 inches Weight:      218 pounds Pulse rate:   88 / minute Pulse rhythm:   regular Resp:     16 per minute BP sitting:   140 / 80  (left arm)  Vitals Entered By: Jacquelin Hawking, CMA (Feb 25, 2010 3:36 PM)  Physical Exam  General:  Pt is alert and oriented, obese woman, in no acute distress. HEENT: normal Neck: normal carotid upstrokes without bruits, JVP normal Lungs: CTA CV: RRR without murmur or gallop Abd: soft, NT, positive BS, no bruit, no organomegaly Ext: no clubbing, cyanosis, or edema. peripheral pulses 2+ and equal Skin: warm and dry without rash    EKG  Procedure date:  02/25/2010  Findings:      NSR with LAD and poor R wave progression - pulmonary disease pattern. Probably LVH with repolarization abnormality, HR 80 bpm.  Impression & Recommendations:  Problem # 1:  CAD, ARTERY BYPASS GRAFT (ICD-414.04) Stable without angina. No med changes today. Pt needs to lose weight - discussed this today and set modest goal of 10 pound weight loss  over next 6 months. Suspect central obesity is playing major role in her dyspnea.  Her updated medication list for this problem includes:    Lisinopril 20 Mg Tabs (Lisinopril) .[name]... Take 1 tablet by mouth once a day    Carvedilol 6.25 Mg Tabs (Carvedilol) .[name]... Take 1 tablet by mouth two times a day    Aspirin 81 Mg Tbec (Aspirin) .[name]... Take one tablet by mouth daily  Orders: EKG w/ Interpretation (93000)  Problem # 2:  CHRONIC SYSTOLIC HEART FAILURE (ICD-428.22) Stable. Continue b-blocker/ACE/diuretic.  Her updated medication list for this problem  includes:    Lisinopril 20 Mg Tabs (Lisinopril) .[name]... Take 1 tablet by mouth once a day    Furosemide 40 Mg Tabs (Furosemide) .[name]... Take 1 tablet by mouth once a day    Carvedilol 6.25 Mg Tabs (Carvedilol) .[name]... Take 1 tablet by mouth two times a day    Aspirin 81 Mg Tbec (Aspirin) .[name]... Take one tablet by mouth daily  Orders: EKG w/ Interpretation (93000)  Problem # 3:  HYPERLIPIDEMIA-MIXED (ICD-272.4) lipids at goal.  Her updated medication list for this problem includes:    Simvastatin 40 Mg Tabs (Simvastatin) .[name]... Take 1 tablet by mouth once a day  Orders: EKG w/ Interpretation (93000)  CHOL: 118 (08/15/2009)   LDL: 73 (08/15/2009)   HDL: 28.10 (08/15/2009)   TG: 84.0 (08/15/2009)  Patient Instructions: 1)  Your physician recommends that you continue on your current medications as directed. Please refer to the Current Medication list given to you today. 2)  Your physician wants you to follow-up in:  6 MONTHS. You will receive a reminder letter in the mail two months in advance. If you don't receive a letter, please call our office to schedule the follow-up appointment. 3)  Your physician recommends that you return for a FASTING LIPID and LIVER Profile (414.04, v58.69)--Nothing to eat or drink after midnight

## 2010-11-18 NOTE — Letter (Signed)
Summary: [facility]  [facility]   Imported By: Marylou Mccoy 11/06/2009 18:28:25  _____________________________________________________________________  External Attachment:    Type:   Image     Comment:   External Document

## 2010-11-18 NOTE — Assessment & Plan Note (Signed)
Summary: f60m   Visit Type:  6 months follow up Referring Provider:  Fleet Contras Primary Provider:  Fleet Contras  CC:  chest pains once in awhile.  History of Present Illness: This is a 70 year old woman with ischemic cardiomyopathy, CAD status post CABG, COPD, and chronic dyspnea. She presents today for followup evaluation.   Overall feels dyspnea is stable. She notes changes based on weather, with more dyspnea in heat and humidity. Notes occasional chest pain in the left upper chest, usually after eating spaghetti or pizza. Denies exertional chest pain or pressure. No leg swelling. No other complaints at present. Reports good medication compliance.   Current Medications (verified): 1)  Ventolin Hfa 108 (90 Base) Mcg/act  Aers (Albuterol Sulfate) .[name].. 1-2 Puffs Every 4-6 Hours As Needed 2)  Propoxyphene N-Apap 100-650 Mg Tabs (Propoxyphene N-Apap) .... Take By Mouth As Needed 3)  Symbicort 160-4.5 Mcg/act Aero (Budesonide-Formoterol Fumarate) .... Inhale 2 Puff Two Times A Day 4)  Lisinopril 20 Mg Tabs (Lisinopril) .... Take 1 Tablet By Mouth Once A Day 5)  Simvastatin 40 Mg Tabs (Simvastatin) .... Take 1 Tablet By Mouth Once A Day 6)  Furosemide 40 Mg Tabs (Furosemide) .... Take 1 Tablet By Mouth Once A Day 7)  Klor-Con M10 10 Meq Cr-Tabs (Potassium Chloride Crys Cr) .... Take 1 Tablet By Mouth Once A Day 8)  Carvedilol 6.25 Mg Tabs (Carvedilol) .... Take 1 Tablet By Mouth Two Times A Day 9)  Vitamin D (Ergocalciferol) 50000 Unit Caps (Ergocalciferol) .... Take 1 Tab By Mouth Once A Week 10)  Multivitamins  Tabs (Multiple Vitamin) .... Take 1 Tablet By Mouth Once A Day 11)  Aspirin 81 Mg Tbec (Aspirin) .... Take One Tablet By Mouth Daily 12)  Fish Oil 1000 Mg Caps (Omega-3 Fatty Acids) .... Take 1 Tablet By Mouth Three Times A Day 13)  Spiriva Handihaler 18 Mcg  Caps (Tiotropium Bromide Monohydrate) .... One Inhalation in Handihaler Daily 14)  Iron 325 (65 Fe) Mg Tabs (Ferrous  Sulfate) .... Take 1 Tablet By Mouth Once A Day 15)  Colcrys 0.6 Mg Tabs (Colchicine) .... Take 1 Tab Two Times A Day As Needed 16)  Allopurinol 100 Mg Tabs (Allopurinol) .... Take 1 Tab Once Daily  Allergies (verified): No Known Drug Allergies  Past History:  Past medical history reviewed for relevance to current acute and chronic problems.  Past Medical History: Reviewed history from 11/25/2008 and no changes required. 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  Review of Systems       Negative except as per HPI   Vital Signs:  Patient profile:   70 year old female Height:      65 inches Weight:      212.75 pounds BMI:     35.53 Pulse rate:   85 / minute Pulse rhythm:   regular Resp:     18 per minute BP sitting:   110 / 74  (left arm) Cuff size:   large  Vitals Entered By: Vikki Ports (September 10, 2010 10:10 AM)  Physical Exam  General:  Pt is alert and oriented, obese woman, in no acute distress. HEENT: normal Neck: normal carotid upstrokes without bruits, JVP normal Lungs: CTA CV: RRR without murmur or gallop Abd: soft, obese, NT, positive BS, no bruit, no organomegaly Ext: no clubbing, cyanosis, or edema. peripheral pulses 2+ and equal Skin: warm and dry without rash    EKG  Procedure  date:  09/10/2010  Findings:      NSR 85 bpm, LAFB, unchanged from prior tracings.  Impression & Recommendations:  Problem # 1:  CHRONIC SYSTOLIC HEART FAILURE (ICD-428.22) Pt stable on an appropriate medical regimen. Appears euvolemic on exam. Discussed salt restriction, dietary changes, and focus on weight loss. She would also benefit from exercise but has limited resources and doesn't live in an area amenable to walking.  Her updated medication list for this problem includes:    Lisinopril 20 Mg Tabs (Lisinopril) .[name]... Take 1 tablet by mouth once a day    Furosemide 40 Mg  Tabs (Furosemide) .[name]... Take 1 tablet by mouth once a day    Carvedilol 6.25 Mg Tabs (Carvedilol) .[name]... Take 1 tablet by mouth two times a day    Aspirin 81 Mg Tbec (Aspirin) .[name]... Take one tablet by mouth daily  Problem # 2:  CAD, ARTERY BYPASS GRAFT (ICD-414.04) Stable without angina. Continue current medical program.  Her updated medication list for this problem includes:    Lisinopril 20 Mg Tabs (Lisinopril) .[name]... Take 1 tablet by mouth once a day    Carvedilol 6.25 Mg Tabs (Carvedilol) .[name]... Take 1 tablet by mouth two times a day    Aspirin 81 Mg Tbec (Aspirin) .[name]... Take one tablet by mouth daily  Orders: EKG w/ Interpretation (93000)  Problem # 3:  HYPERLIPIDEMIA-MIXED (ICD-272.4) Followup lipids and lft's in 6 months, LDL at goal as below.  Her updated medication list for this problem includes:    Simvastatin 40 Mg Tabs (Simvastatin) .[name]... Take 1 tablet by mouth once a day  CHOL: 122 (02/28/2010)   LDL: 68 (02/28/2010)   HDL: 29.00 (02/28/2010)   TG: 125.0 (02/28/2010)  Problem # 4:  HYPERTENSION, BENIGN (ICD-401.1) BP well-controlled.  Her updated medication list for this problem includes:    Lisinopril 20 Mg Tabs (Lisinopril) .[name]... Take 1 tablet by mouth once a day    Furosemide 40 Mg Tabs (Furosemide) .[name]... Take 1 tablet by mouth once a day    Carvedilol 6.25 Mg Tabs (Carvedilol) .[name]... Take 1 tablet by mouth two times a day    Aspirin 81 Mg Tbec (Aspirin) .[name]... Take one tablet by mouth daily  BP today: 110/74 Prior BP: 140/82 (07/09/2010)  Labs Reviewed: K+: 4.2 (08/20/2008) Creat: : 1.0 (08/20/2008)   Chol: 122 (02/28/2010)   HDL: 29.00 (02/28/2010)   LDL: 68 (02/28/2010)   TG: 125.0 (02/28/2010)  Patient Instructions: 1)  Your physician recommends that you return for a FASTING Lipid, Liver and BMP in 6 MONTHS. Nothng to eat or drink after midnight (414.02, 272.0)  2)  Your physician recommends that you continue on your current medications as directed. Please  refer to the Current Medication list given to you today. 3)  Your physician wants you to follow-up in: 6 MONTHS.  You will receive a reminder letter in the mail two months in advance. If you don't receive a letter, please call our office to schedule the follow-up appointment.

## 2010-12-25 ENCOUNTER — Encounter: Payer: Self-pay | Admitting: Pulmonary Disease

## 2010-12-31 LAB — DIFFERENTIAL
Basophils Relative: 0 % (ref 0–1)
Eosinophils Relative: 0 % (ref 0–5)
Lymphs Abs: 1.1 10*3/uL (ref 0.7–4.0)
Monocytes Absolute: 1.1 10*3/uL — ABNORMAL HIGH (ref 0.1–1.0)
Monocytes Relative: 5 % (ref 3–12)

## 2010-12-31 LAB — CBC
Hemoglobin: 15 g/dL (ref 12.0–15.0)
Platelets: 69 10*3/uL — ABNORMAL LOW (ref 150–400)
RBC: 5.18 MIL/uL — ABNORMAL HIGH (ref 3.87–5.11)
WBC: 21.8 10*3/uL — ABNORMAL HIGH (ref 4.0–10.5)

## 2011-01-05 ENCOUNTER — Ambulatory Visit: Payer: Self-pay | Admitting: Pulmonary Disease

## 2011-01-05 ENCOUNTER — Telehealth (INDEPENDENT_AMBULATORY_CARE_PROVIDER_SITE_OTHER): Payer: Self-pay | Admitting: *Deleted

## 2011-01-06 ENCOUNTER — Telehealth: Payer: Self-pay | Admitting: Pulmonary Disease

## 2011-01-06 ENCOUNTER — Ambulatory Visit: Payer: Self-pay | Admitting: Pulmonary Disease

## 2011-01-06 ENCOUNTER — Telehealth: Payer: Self-pay | Admitting: *Deleted

## 2011-01-06 NOTE — Telephone Encounter (Signed)
Call from Patient Call back at [phone]   Caller: Patient Call For: Dr. Shelle Iron Reason for Call: Refill Medication Summary of Call: Patient has appointment on 3/30 at 3:00 PM.  Needs refills of Symbicort and Spiriva.  Also, insurance will no longer pay for Ventolin, so patient needs substitute.  [facility] [address]. Initial call taken by: Leonette Monarch,  January 05, 2011 10:44 AM  Follow-up for Phone Call         refill sent for symbicort and spiriva. LMTCBx1 to ask pt to check with insurance to see what alternative to ventolin is./ Carron Curie CMA  January 05, 2011 1:16 PM     New/Updated Medications: VENTOLIN HFA 108 (90 BASE) MCG/ACT  AERS (ALBUTEROL SULFATE) 1-2 puffs every 4-6 hours as needed Prescriptions: SPIRIVA HANDIHALER 18 MCG  CAPS (TIOTROPIUM BROMIDE MONOHYDRATE) one inhalation in handihaler daily  #30 x 0  Entered by: Carron Curie CMA  Authorized by: Barbaraann Share MD  Signed by: Carron Curie CMA on 01/05/2011  Method used: [facility]     [facility]  [address] [id number]    [address]    [place], [place]  [address]    Ph: [phone] or [phone]    Fax: [phone]  RxID: [id number] SYMBICORT 160-4.5 MCG/ACT AERO (BUDESONIDE-FORMOTEROL FUMARATE) Inhale 2 puff two times a day  #1 x 0  Entered by: Carron Curie CMA  Authorized by: Barbaraann Share MD  Signed by: Carron Curie CMA on 01/05/2011  Method used: [facility]ically [facility]     [facility]  [address] [id number]    [address]    [place], [place]  [address]    Ph: [phone] or [phone]    Fax: [phone]  RxID: [id number]  Pt returned call and I advised refills sent for Symbicort and Spiriva.I advised pt to call insurance and check formulary to see what alternative is covered in place of the ventolin and let us know and we will ask the doctor for an rx. Carron Curie, MA

## 2011-01-06 NOTE — Telephone Encounter (Signed)
Phone Note  Call from Patient Call back at [phone]   Caller: Patient Call For: Dr. Shelle Iron Reason for Call: Refill Medication Summary of Call: Patient has appointment on 3/30 at 3:00 PM.  Needs refills of Symbicort and Spiriva.  Also, insurance will no longer pay for Ventolin, so patient needs substitute.  [facility] [address]. Initial call taken by: Leonette Monarch,  January 05, 2011 10:44 AM  Follow-up for Phone Call         refill sent for symbicort and spiriva. LMTCBx1 to ask pt to check with insurance to see what alternative to ventolin is./ Carron Curie CMA  January 05, 2011 1:16 PM     New/Updated Medications: VENTOLIN HFA 108 (90 BASE) MCG/ACT  AERS (ALBUTEROL SULFATE) 1-2 puffs every 4-6 hours as needed Prescriptions: SPIRIVA HANDIHALER 18 MCG  CAPS (TIOTROPIUM BROMIDE MONOHYDRATE) one inhalation in handihaler daily  #30 x 0  Entered by: Carron Curie CMA  Authorized by: Barbaraann Share MD  Signed by: Carron Curie CMA on 01/05/2011  Method used: [facility]ically [facility]     [facility]  [address] [id number]    [address]    [place], [place]  [address]    Ph: [phone] or [phone]    Fax: [phone]  RxID: [id number] SYMBICORT 160-4.5 MCG/ACT AERO (BUDESONIDE-FORMOTEROL FUMARATE) Inhale 2 puff two times a day  #1 x 0  Entered by: Carron Curie CMA  Authorized by: Barbaraann Share MD  Signed by: Carron Curie CMA on 01/05/2011  Method used: [facility]ically [facility]     [facility]  [address] [id number]    [address]    [place], [place]  [address]    Ph: [phone] or [phone]    Fax: [phone]  RxID: [id number]     Called and spoke with pt and she states she will call back tomorrow and inform us what he insurance will cover since they don't cover her ventolin [place], [place]

## 2011-01-07 ENCOUNTER — Ambulatory Visit: Payer: Medicare Other

## 2011-01-07 NOTE — Telephone Encounter (Signed)
Spoke with patient-states the insurance will cover Proair HFA and/or Xopenex HFA. Please advise of the two you would like to give patient. Thanks.   Ph[facility]: [facility] [address]

## 2011-01-08 MED ORDER — ALBUTEROL SULFATE HFA 108 (90 BASE) MCG/ACT IN AERS: INHALATION_SPRAY | RESPIRATORY_TRACT | Status: DC

## 2011-01-08 NOTE — Telephone Encounter (Signed)
proair is fine 

## 2011-01-08 NOTE — Telephone Encounter (Signed)
Pt aware rx was sent [place], [place]

## 2011-01-14 ENCOUNTER — Encounter: Payer: Self-pay | Admitting: *Deleted

## 2011-01-15 NOTE — Progress Notes (Signed)
Summary: Refill and substitution-LMTCBx1  Phone Note Call from Patient Call back at [phone]   Caller: Patient Call For: Dr. Shelle Iron Reason for Call: Refill Medication Summary of Call: Patient has appointment on 3/30 at 3:00 PM.  Needs refills of Symbicort and Spiriva.  Also, insurance will no longer pay for Ventolin, so patient needs substitute.  [facility] [address]. Initial call taken by: Leonette Monarch,  January 05, 2011 10:44 AM  Follow-up for Phone Call        refill sent for symbicort and spiriva. LMTCBx1 to ask pt to check with insurance to see what alternative to ventolin is./ Carron Curie CMA  January 05, 2011 1:16 PM   Additional Follow-up for Phone Call Additional follow up Details #1::        see epic [place]  January 06, 2011 6:21 PM     New/Updated Medications: VENTOLIN HFA 108 (90 BASE) MCG/ACT  AERS (ALBUTEROL SULFATE) 1-2 puffs every 4-6 hours as needed Prescriptions: SPIRIVA HANDIHALER 18 MCG  CAPS (TIOTROPIUM BROMIDE MONOHYDRATE) one inhalation in handihaler daily  #30 x 0   Entered by:   Carron Curie CMA   Authorized by:   Barbaraann Share MD   Signed by:   Carron Curie CMA on 01/05/2011   Method used:   [facility] to        [facility]  [address] [id number]       [address]       [place], [place]  [address]       Ph: [phone] or [phone]       Fax: [phone]   RxID:   [id number] SYMBICORT 160-4.5 MCG/ACT AERO (BUDESONIDE-FORMOTEROL FUMARATE) Inhale 2 puff two times a day  #1 x 0   Entered by:   Carron Curie CMA   Authorized by:   Barbaraann Share MD   Signed by:   Carron Curie CMA on 01/05/2011   Method used:   [facility] to        [facility]  [address] [id number]       [address]       [place], [place]  [address]       Ph: [phone] or [phone]       Fax: [phone]   RxID:   [id number]

## 2011-01-16 ENCOUNTER — Ambulatory Visit (INDEPENDENT_AMBULATORY_CARE_PROVIDER_SITE_OTHER): Payer: PRIVATE HEALTH INSURANCE | Admitting: Pulmonary Disease

## 2011-01-16 ENCOUNTER — Encounter: Payer: Self-pay | Admitting: Pulmonary Disease

## 2011-01-16 VITALS — BP 130/74 | HR 89 | Temp 99.0°F | Ht 65.0 in | Wt 220.6 lb

## 2011-01-16 DIAGNOSIS — J438 Other emphysema: Secondary | ICD-10-CM

## 2011-01-16 NOTE — Assessment & Plan Note (Signed)
The pt is maintaining a stable baseline wrt her breathing.  She has not had a recent acute exacerbation or pulmonary infection.  I have asked her to stay on her current inhaler regimen, and to also work on weight reduction and conditioning program.

## 2011-01-16 NOTE — Patient Instructions (Signed)
Stay on current breathing meds Work on weight loss Can try plain zyrtec 10mg  one a day during allergy season followup with me in 6mos

## 2011-01-16 NOTE — Progress Notes (Signed)
  Subjective:    Patient ID: [name], female    DOB: 26-Jun-1941, 70 y.o.   MRN: [id number]  HPI The pt comes in today for f/u of her known emphysema.  She also has chronic systolic heart failure which contributes to her doe, along with her obesity.  She is staying on her inhalers, and denies any recent pulmonary infections or acute exacerbations.  She is having issues with allergies, but is not taking an antihistamine.    Review of Systems  Constitutional: Negative for fever, appetite change and unexpected weight change.  HENT: Positive for ear pain, sneezing and sinus pressure. Negative for congestion, sore throat, trouble swallowing and dental problem.   Respiratory: Positive for shortness of breath and wheezing. Negative for cough.   Cardiovascular: Positive for leg swelling. Negative for chest pain and palpitations.  Gastrointestinal: Negative for nausea, abdominal pain and diarrhea.  Genitourinary: Negative for dysuria.  Musculoskeletal: Positive for joint swelling.  Skin: Negative for rash.  Neurological: Positive for headaches. Negative for syncope.  Hematological: Does not bruise/bleed easily.  Psychiatric/Behavioral: Positive for dysphoric mood. The patient is not nervous/anxious.        Objective:   Physical Exam Obese female in nad  No purulence or discharge from nares Chest with decreased bs, no wheezing, bibasilar crackles. Cor with rrr LE with 1+ edema bilat, no cyanosis  Alert and oriented, moves all 4        Assessment & Plan:

## 2011-01-20 ENCOUNTER — Ambulatory Visit: Payer: No Typology Code available for payment source | Attending: Internal Medicine | Admitting: Physical Therapy

## 2011-01-20 DIAGNOSIS — M2560 Stiffness of unspecified joint, not elsewhere classified: Secondary | ICD-10-CM | POA: Insufficient documentation

## 2011-01-20 DIAGNOSIS — IMO0001 Reserved for inherently not codable concepts without codable children: Secondary | ICD-10-CM | POA: Insufficient documentation

## 2011-01-20 DIAGNOSIS — R262 Difficulty in walking, not elsewhere classified: Secondary | ICD-10-CM | POA: Insufficient documentation

## 2011-01-20 DIAGNOSIS — M2550 Pain in unspecified joint: Secondary | ICD-10-CM | POA: Insufficient documentation

## 2011-01-26 ENCOUNTER — Ambulatory Visit: Payer: No Typology Code available for payment source

## 2011-02-03 ENCOUNTER — Other Ambulatory Visit: Payer: Self-pay | Admitting: Pulmonary Disease

## 2011-02-04 ENCOUNTER — Ambulatory Visit: Payer: No Typology Code available for payment source | Admitting: Physical Therapy

## 2011-02-06 ENCOUNTER — Ambulatory Visit: Payer: No Typology Code available for payment source | Admitting: Physical Therapy

## 2011-02-09 ENCOUNTER — Ambulatory Visit: Payer: No Typology Code available for payment source | Admitting: Rehabilitative and Restorative Service Providers"

## 2011-02-11 ENCOUNTER — Encounter: Payer: PRIVATE HEALTH INSURANCE | Admitting: Rehabilitative and Restorative Service Providers"

## 2011-02-17 ENCOUNTER — Encounter: Payer: Self-pay | Admitting: Cardiovascular Disease

## 2011-02-18 ENCOUNTER — Ambulatory Visit (INDEPENDENT_AMBULATORY_CARE_PROVIDER_SITE_OTHER): Payer: PRIVATE HEALTH INSURANCE | Admitting: Cardiovascular Disease

## 2011-02-18 ENCOUNTER — Encounter: Payer: Self-pay | Admitting: Cardiovascular Disease

## 2011-02-18 VITALS — BP 122/72 | HR 82 | Ht 65.0 in | Wt 215.8 lb

## 2011-02-18 DIAGNOSIS — I5022 Chronic systolic (congestive) heart failure: Secondary | ICD-10-CM

## 2011-02-18 DIAGNOSIS — I10 Essential (primary) hypertension: Secondary | ICD-10-CM

## 2011-02-18 DIAGNOSIS — I25810 Atherosclerosis of coronary artery bypass graft(s) without angina pectoris: Secondary | ICD-10-CM

## 2011-02-18 DIAGNOSIS — E785 Hyperlipidemia, unspecified: Secondary | ICD-10-CM

## 2011-02-18 NOTE — Assessment & Plan Note (Signed)
Pt on statin therapy. Lipids followed by Dr Concepcion Elk.
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
## 2011-02-18 NOTE — Assessment & Plan Note (Signed)
The patient appears euvolemic. Her last echocardiogram was in 2009 and showed an ejection fraction of 35-40% with multiple wall motion abnormalities. This study was several months after her coronary bypass surgery.

## 2011-02-18 NOTE — Assessment & Plan Note (Signed)
Good BP control.

## 2011-02-18 NOTE — Patient Instructions (Signed)
Your physician recommends that you continue on your current medications as directed. Please refer to the Current Medication list given to you today.  Your physician wants you to follow-up in: 6 MONTHS. You will receive a reminder letter in the mail two months in advance. If you don't receive a letter, please call our office to schedule the follow-up appointment.  

## 2011-02-18 NOTE — Assessment & Plan Note (Signed)
The patient is stable without angina. She is tolerating antiplatelet therapy with aspirin. She is on appropriate medical therapy with an ACE inhibitor and beta blocker. We'll continue her current regimen.

## 2011-02-20 ENCOUNTER — Encounter: Payer: PRIVATE HEALTH INSURANCE | Admitting: Physical Therapy

## 2011-02-24 ENCOUNTER — Encounter: Payer: PRIVATE HEALTH INSURANCE | Admitting: Physical Therapy

## 2011-03-02 ENCOUNTER — Ambulatory Visit: Payer: No Typology Code available for payment source | Attending: Internal Medicine | Admitting: Physical Therapy

## 2011-03-02 DIAGNOSIS — IMO0001 Reserved for inherently not codable concepts without codable children: Secondary | ICD-10-CM | POA: Insufficient documentation

## 2011-03-02 DIAGNOSIS — M2550 Pain in unspecified joint: Secondary | ICD-10-CM | POA: Insufficient documentation

## 2011-03-02 DIAGNOSIS — R262 Difficulty in walking, not elsewhere classified: Secondary | ICD-10-CM | POA: Insufficient documentation

## 2011-03-02 DIAGNOSIS — M2560 Stiffness of unspecified joint, not elsewhere classified: Secondary | ICD-10-CM | POA: Insufficient documentation

## 2011-03-03 NOTE — Cardiovascular Report (Signed)
NAME:  [name], [name]                 ACCOUNT NO.:  [id number]   MEDICAL RECORD NO.:  [id number]          PATIENT TYPE:  INP   LOCATION:  2807                         FACILITY:  [facility]   PHYSICIAN:  Bruce R. Juanda Chance, MD, FACCDATE OF BIRTH:  13-Jul-1941   DATE OF PROCEDURE:  11/18/2007  DATE OF DISCHARGE:                            CARDIAC CATHETERIZATION   RIGHT AND LEFT HEART CATHETERIZATION   CLINICAL HISTORY:  [name] is 70 years old and has history of  asthma, hypertension and tobacco use.  She presented to the emergency  room with  shortness of breath and was felt to have congestive heart  failure, as well as chronic obstructive pulmonary disease.  An  echocardiogram showed an ejection fraction of 25% to 30%.  She was seen  in consultation by Calton Dach and scheduled for evaluation angiography.   PROCEDURE:  Right heart catheterization was performed percutaneously via  right femoral vein, using a venous sheath and Swan-Ganz thermodilution  catheter.  Left heart catheterization was performed percutaneously via  the femoral artery using arterial sheath and 5-French preformed coronary  catheters.  A front wall arterial puncture was performed, and Omnipaque  contrast was used.  The patient tolerated the procedure well and left  the laboratory in satisfactory condition.   RESULTS:  The left main coronary artery:  The left main coronary artery  was free of significant disease.   Left anterior descending artery:  The left anterior descending artery  gave rise to two septal perforators and a large diagonal branch and then  was completely occluded in its proximal portion.  There was a 90%  stenosis and the ostium of the large diagonal branch, and then there was  total occlusion of a sub-segment of the diagonal branch.  The distal LAD  filled by well-developed collaterals from the second septal perforator  to more distal septal perforators.   The circumflex artery:  The circumflex  artery gave rise to a large  marginal branch and posterolateral branch.  There was 90% stenosis in  the proximal circumflex artery just after the early marginal branch, and  there was 90% ostial stenosis in the early marginal branch.  The very  distal portion of the marginal branch was completely occluded and filled  by collaterals.   The right coronary artery:  The right coronary artery was completed  occluded proximally.  Distal right coronary consisted of two  posterolateral branches and dual posterior descending branches, which  filled via collaterals from the left coronary artery.   The left ventriculogram:  The left ventriculogram performed in the RAO  projection showed akinesis of the apex and hypokinesis of the  anterolateral wall and inferior wall.  Only the anterior wall near the  base moved well.  The estimated ejection fraction was 20%.   A subclavian ejection:  A subclavian ejection showed patent vertebral  and internal mammary arteries.   Distal aortogram:  Distal aortogram was performed which showed patent  renal arteries with no significant stenosis.  There were irregularities  in the aortoiliac system but no major obstruction.   HEMODYNAMIC  DATA:  The right atrial pressure was 11 mean.  The pulmonary  artery pressure was 43/13, with a mean of 29.  The pulmonary artery  pressure was 16 mean.  Left neck pressure was 160/19.  The aortic  pressure was 160/81 with a mean of 110.  The pulmonary artery saturation  was 69%, and the aortic saturation was 93%.  The cardiac output/cardiac  index was 5.8/2.6 liters/minutes/m2 by Fick.   CONCLUSION:  1. Severe three-vessel coronary artery disease with total occlusion of      proximal LAD and 90% stenosis in the large diagonal branch, 90%      stenosis in the proximal circumflex artery with 90% ostial stenosis      of the large marginal branch, and total occlusion of the right      coronary artery.  2. Severe left ventricular  dysfunction with apical wall akinesis and      anterolateral wall and inferior wall hypokinesis and estimated      fraction of 20%.  3. Mild to moderate elevation of the pulmonary wedge and pulmonary      artery pressures.   RECOMMENDATIONS:  The patient has severe three-vessel disease and severe  left ventricular dysfunction.  Will plan surgical consultation for  revascularization.  We might consider a viability LV study before  revascularization, but most of the wall motion is hypokinetic, and there  is collateral circulation to all areas, so I suspect that they are his  viable muscle.      Bruce Elvera Lennox Juanda Chance, MD, [facility]  Electronically Signed     BRB/MEDQ  D:  11/18/2007  T:  11/18/2007  Job:  [id number]   cc:   [name]. Excell Seltzer, MD  Everardo Beals. Juanda Chance, MD, [facility]  Cardiopulmonary Lab
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
## 2011-03-03 NOTE — Assessment & Plan Note (Signed)
[name]                            CARDIOLOGY OFFICE NOTE   NAME:[name], [name]                          MRN:          [id number]  DATE:05/29/2008                            DOB:          [birth year]    [name] returns for followup at [facility] Cardiology Office on May 29, 2008.  She is a delightful 70 year old woman with multivessel CAD  and severe LV dysfunction who underwent coronary bypass surgery earlier  this year.  She has successfully stopped smoking cigarettes.  She  presented in July with nocturnal cough and exertional dyspnea.  I felt  her symptoms were consistent with congestive heart failure and I started  her back on daily Lasix at that time.  Her ACE inhibitor dose was also  doubled.  She has improved symptomatically her nocturnal cough has  resolved.  Exertional dyspnea is better.  She maintains a modest  activity level and is able to do household chores.  She does not get out  of the house much.  She is not short of breath with low-level activity.  She denies chest pain or other complaints.  She is having difficulty  obtaining her medications secondary to cost.   CURRENT MEDICATIONS:  1. Coreg 6.25 mg twice daily.  2. Lipitor 40 mg at bedtime.  3. Plavix 75 mg daily.  4. Multivitamin daily.  5. Aspirin 81 mg daily.  6. Nu-Iron daily.  7. Lasix 40 mg daily.  8. Potassium 10 mEq daily.  9. Lisinopril 20 mg daily.   ALLERGIES:  NKDA.   PHYSICAL EXAMINATION:  GENERAL:  On exam, the patient is alert and  oriented and she is in no acute distress.  VITAL SIGNS:  Weight is 196 pounds, blood pressure is 144/80, heart rate  is 71, and respiratory rate is 16.  HEENT:  Normal.  NECK:  Normal carotid upstrokes.  JVP normal.  LUNGS:  Clear to auscultation bilaterally.  HEART:  Regular rate and rhythm.  No murmurs or gallops.  ABDOMEN:  Soft, nontender.  No organomegaly.  EXTREMITIES:  No clubbing, cyanosis, or edema.  Peripheral  pulses 2+ and  equal.   EKG shows sinus rhythm with PACs.  Indeterminate anterior MI.   Echo from a April 30, 2008, shows moderately severe LV dysfunction with  LVEF of 35-40%.  Severe hypokinesis of the anteroseptum are inferoseptum  are noted.   ASSESSMENT:  1. Coronary artery disease status post coronary artery bypass graft.  2. Ischemic cardiomyopathy with stable New York Heart Association      class II congestive heart failure.  3. Dyslipidemia.  4. Essential hypertension.   [name] was clinically improved with daily Lasix.  We will check a  metabolic panel today to make sure the creatinine and potassium levels  are stable.  In reviewing her medications, her major cost is coming from  Plavix and Lipitor.  I have taken the liberty of changing her Lipitor to  Zocor 40 mg daily.  I do not see a clear indication for Plavix.  I  suspect it was started because  of a non-ST-elevation MI at presentation.  She did not present with a classic acute coronary syndrome.  She was  treated with coronary bypass surgery and I think Plavix can be  discontinued as the benefit is relatively small.  We will continue her  on daily aspirin.  For clinical followup, I would like to see her back  in 3 months.     [name]. Excell Seltzer, MD  Electronically Signed    MDC/MedQ  DD: 05/29/2008  DT: 05/30/2008  Job #: [id number]

## 2011-03-03 NOTE — Assessment & Plan Note (Signed)
OFFICE VISIT   [name], [name]  DOB:  [birth year]                                        December 26, 2007  CHART #:  [id number]   The patient is a 70 year old woman who had coronary bypass grafting x5  on February 2nd.  She was seen in the office after discharge with  cellulitis in her left leg from the saphenous venectomy site.  She was  treated with Keflex for several days but did not improve and I  subsequently changed her to Cipro.  Since then she noticed that she did  have significant improvement initially and then it sort of leveled off  and she still had some swelling and some discomfort in that area.  The  pain is much better than it was previously.   Otherwise, she is still taking pain medication when she goes to bed at  night.  She has been taking oxycodone.  She still has some difficulty  with getting to sleep and still has some diminished appetite, although  she thinks that has been improving over the past several days.  She  complains of her skin being very sensitive when her shirt rubs on the  skin around her sternal incision.   PHYSICAL EXAMINATION:  General:  The patient is a 70 year old female in  no acute distress.  Vital signs:  Blood pressure 155/87, pulse 84,  respirations are 18, her oxygen saturation is 94% on room air.  Chest:  Her sternal incision is clean, dry and intact.  Her sternum is stable.  Chest tube sites are well healed.  Her leg incisions are healed.  There  is some hematoma along the tract.  There is some very minimal induration  but no significant erythema.  There is no calf tenderness.   IMPRESSION:  The patient is a 70 year old woman.  She is now about 5  weeks out from coronary bypass grafting.  At this point in time, she is  making good progress.  Her infection is resolved.  She does have some  seroma or hematoma along her saphenous venectomy tract.  I just advised  her to keep an eye on this and let us know if it were  to start getting  more tender or more swelling or more redness to let us know right away.  I did give her a prescription for some additional pain medication but I  am going to change her to Darvocet from oxycodone and try and start  weaning her off that.  I did encourage her to continue to exercise.  She  is concerned about transportation issues in terms of doing cardiac  rehab.  I strongly encouraged her to do that if at all possible and if  not she does need to be on a regular exercise routine at home.  She will  continue to be followed by Dr. Excell Seltzer.  I would be happy to see her back  any time if I can be of any further assistance with her care.   Salvatore Decent Dorris Fetch, M.D.  Electronically Signed   SCH/MEDQ  D:  12/26/2007  T:  12/26/2007  Job:  [id number]   cc:   [name]. Excell Seltzer, MD

## 2011-03-03 NOTE — Assessment & Plan Note (Signed)
[facility]                            CARDIOLOGY OFFICE NOTE   NAME:[name], [name]                          MRN:          [id number]  DATE:01/23/2008                            DOB:          September 25, 1941    [name] was seen in follow-up at the [facility] cardiology office on  January 23, 2008.  Mr. [name] is a 70 year old woman with multivessel  coronary artery disease and severe LV dysfunction who underwent coronary  bypass surgery on February 2 of this year.  She has had a nice recovery  and continues to do relatively well at home after bypass.  She had some  ongoing sensation in her chest around the incision site that is a sharp  painful feeling.  She does not notice this as much with activity but it  bothers her most at rest.  She has no dyspnea, edema, orthopnea, PND,  palpitations, lightheadedness or other complaints.  She is tolerating  her medical therapy well.   CURRENT MEDICINES:  1. Coreg 6.25 mg twice daily.  2. Lipitor 40 mg at bedtime.  3. Lasix 20 mg daily.  4. Spiriva 18 mcg.  5. Plavix 75 mg daily.  6. Multivitamin daily.  7. Aspirin 81 mg daily.  8. Lisinopril 10 mg daily.  9. Nu-Iron daily.   ALLERGIES:  NKDA.   PHYSICAL EXAMINATION:  The patient is alert and oriented.  She is in no  distress.  Weight is 182, blood pressure 136/80, heart rate 88,  respiratory rate is 16.  HEENT:  Normal.  NECK:  Normal carotid upstrokes without bruits. Jugular venous pressure  is normal.  LUNGS:  Clear bilaterally.  HEART:  Regular rate and rhythm without murmurs or gallops.  ABDOMEN:  Soft, nontender no organomegaly.  EXTREMITIES:  No clubbing, cyanosis or edema. Peripheral pulses are 2+  and equal.   LABORATORY DATA:  Labs drawn from on April 3 showed a normal metabolic  panel with the exception of a minimally increased glucose at 102.  Her  creatinine was normal at 0.9.  LFTs were normal.  Total cholesterol was  104, triglycerides 133,  HDL 29, LDL 49.   ASSESSMENT:  1. Coronary artery disease status post coronary artery bypass graft.      [name] is currently tolerating her medical therapy well.  I      will continue current regimen without changes.  At the time of her      next visit, I will likely increase her Coreg to 12.5 mg twice      daily, but for now will continue current dose.  She should have a      repeat echo when she comes back in 3 months to see if she has had      any significant recovery of LV function after bypass.  2. Dyslipidemia.  Lipids at goal on Lipitor.   Overall [name] has made excellent progress.  I will see her back in  3 months for follow-up.  She requests referral to a primary care  physician and we gave her  contact numbers for the various [place]  offices.     [name]. Excell Seltzer, MD  Electronically Signed    MDC/MedQ  DD: 01/23/2008  DT: 01/23/2008  Job #: [id number]

## 2011-03-03 NOTE — Assessment & Plan Note (Signed)
[facility]                            CARDIOLOGY OFFICE NOTE   NAME:[name], [name]                          MRN:          [id number]  DATE:12/22/2007                            DOB:          [birth year]    Chesnie Capell was seen in hospital followup at the [facility] Cardiology  office on December 22, 2007.  Ms. Hagmann is a very nice 70 year old woman  who recently underwent coronary bypass surgery by Dr. Dorris Fetch back  on February 2.  She really has done fairly well with surgery with the  exception of left leg cellulitis.  She has been treated with a course of  Keflex and then a second course of Cipro.  She feels that her leg is  improved and is currently not having pain or redness of the leg.   Ms. Carrell complains of some numbness around the sternal incision site,  but she has no chest pain, dyspnea, orthopnea, PND or edema.  She has  been treated with low-dose Lasix and her edema has resolved since this  was started.  She is tolerating her current medications well and has no  other complaints today.   MEDICATIONS:  1. Aspirin 325 mg daily.  2. Coreg 6.25 mg twice daily.  3. Lisinopril 2.5 mg twice daily.  4. Lipitor 40 mg at bedtime.  5. Niferex 150 mg daily.  6. Lasix 20 mg daily.  7. Spiriva two daily.  8. Plavix 75 mg daily.  9. Multivitamin daily.   ALLERGIES:  NKDA.   PHYSICAL EXAMINATION:  The patient is alert and oriented.  She is in no  distress.  Weight is 183, blood pressure 148/90, heart rate 61,  respiratory rate 16.  HEENT:  Normal.  NECK:  Normal carotid upstrokes without bruits.  Jugular venous pressure  is normal.  LUNGS:  Clear bilaterally.  HEART:  Regular rate and rhythm without murmurs or gallops.  CHEST:  There is a well-healed sternal incision.  ABDOMEN:  Soft, nontender no bruits.  No organomegaly.  EXTREMITIES:  No clubbing, cyanosis or edema.  The left leg has brownish  discoloration along the medial aspect at the  site of a previous  cellulitis, but there is no erythema, warmth or tenderness.  There is no  drainage from the saphenous vein harvest site.  Pedal pulses are 2+ and  equal.   EKG:  Shows sinus shows sinus rhythm with left axis deviation and poor R  wave progression across the precordium. There is T-wave abnormality  present, suggestive of inferolateral ischemia.   ASSESSMENT:  1. Coronary artery disease status post coronary artery bypass graft.      Ms. Pillars initial presentation was consistent with heart failure      and she was found to have left ventricular dysfunction.  This led      to cardiac catheterization which showed severe multivessel disease      and ultimately she underwent coronary artery bypass graft as      detailed above.  She is doing relatively well at present.  I think  she should continue on her current antiplatelet regimen. However, I      asked her to decrease her aspirin dose to 81 mg and she is taking      Plavix in conjunction with aspirin.  I asked her about cardiac      rehab, but she is unable to get transportation, so she will not be      able to participate.  I would like to see her back closely in 3-4      weeks to make sure she is continuing to progress.  2. Hypertension.  I have asked Ms. Haggard to increase her lisinopril      from 2.5 mg twice daily to 10 mg once daily so that she will get      twice her current dose and hopefully will improve blood pressure      with that.  Will continue to titrate her Coreg and lisinopril as      needed for better blood pressure control.  3. Dyslipidemia.  She is currently on Lipitor 40 mg.  Will check      lipids and LFTs at her next visit.  4. For followup, I am going to see Ms. Co in 3-4 weeks with a      metabolic panel and lipids.  Also will check a basic metabolic      panel today since she has been on Lasix  to make sure that her      potassium is okay.   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     [name]. Excell Seltzer, MD  Electronically Signed    MDC/MedQ  DD: 12/22/2007  DT: 12/22/2007  Job #: [id number]   cc:   Salvatore Decent. Dorris Fetch, M.D.

## 2011-03-03 NOTE — Assessment & Plan Note (Signed)
[facility]                            CARDIOLOGY OFFICE NOTE   [name], [name]                          MRN:          [id number]  DATE:04/30/2008                            DOB:          Feb 28, 1941    [name] was seen in followup at the [facility] Cardiology Office on  April 30, 2008.  She is a 70 year old woman with multivessel coronary  artery disease and severe LV dysfunction, who underwent coronary bypass  surgery in February.  She has had a nice recovery from surgery.  She has  been able to stop smoking.  She complains of persistent numbness in the  chest, but with minimal improvement.  She denies orthopnea, PND, or  edema.  She does complain of an exertional dyspnea that has been worse  over the past few weeks as well as a nocturnal cough.  She has no chest  pains or other complaints.   MEDICATIONS:  1. Coreg 6.25 mg twice daily.  2. Lipitor 40 mg at bedtime.  3. Plavix 75 mg daily.  4. Spiriva daily.  5. Multivitamin daily.  6. Aspirin 81 mg daily.  7. Lisinopril 10 mg daily.  8. New iron daily.   ALLERGIES:  NKDA.   PHYSICAL EXAMINATION:  GENERAL:  She is alert and oriented in no acute  distress.  VITAL SIGNS:  Blood pressure is 150/90, heart rate 52, respiratory rate  is 18, and weight is 192 pounds.  HEENT:  Normal.  NECK:  Normal carotid upstrokes without bruits.  Jugular venous pressure  is normal.  LUNGS:  Bibasilar rales and rhonchi.  HEART:  Bradycardic and regular without murmurs or gallops.  ABDOMEN:  Soft and nontender.  No organomegaly.  No abdominal bruits.  EXTREMITIES:  No clubbing, cyanosis, or edema.  Peripheral pulses are 2+  and equal.   ASSESSMENT:  1. Coronary artery disease, status post coronary artery bypass graft.  2. Severe ischemic cardiomyopathy with preoperative left ventricular      ejection fraction of 25-30%.  3. Chronic systolic heart failure secondary to ischemic      cardiomyopathy.  4.  Dyslipidemia.  5. Hypertension.   DISCUSSION:  [name] has symptoms that are concerning for heart  failure with worsening dyspnea and nocturnal cough.  I have asked her to  start back on Lasix 40 mg daily and potassium 10 mEq daily.  Her blood  pressure also has room for improvement.  Her lisinopril dose was  increased to 20 mg daily.  I would like to check a chest x-ray and 2-D  echocardiogram for reassessment of her LV function.  I am hopeful that  with revascularization, her EF has improved.  I would like to see [name] back in 2 weeks and we will check a BMET at the time of her  return office visit.  If she has progressive symptoms, she was asked to  contact us or if severe symptoms occur, to contact the EMS.      [name]. Excell Seltzer, MD  Electronically Signed    MDC/MedQ  DD: 05/04/2008  DT: 05/05/2008  Job #: [id number]

## 2011-03-03 NOTE — Op Note (Signed)
NAME:  [name], [name]                 ACCOUNT NO.:  [id number]   MEDICAL RECORD NO.:  [id number]          PATIENT TYPE:  INP   LOCATION:  2301                         FACILITY:  [facility]   PHYSICIAN:  Salvatore Decent. Dorris Fetch, M.D.DATE OF BIRTH:  04/20/1941   DATE OF PROCEDURE:  11/21/2007  DATE OF DISCHARGE:                               OPERATIVE REPORT   PREOPERATIVE DIAGNOSIS:  Severe three-vessel coronary disease with  unstable angina.   POSTOPERATIVE DIAGNOSIS:  Severe three-vessel coronary disease with  unstable angina.   PROCEDURE:  Median sternotomy, extracorporeal circulation, coronary  artery bypass grafting x5 (left internal mammary artery to LAD,  saphenous vein graft to first diagonal, sequential saphenous vein graft  to obtuse marginal one and two, saphenous vein graft to posterior  descending), endoscopic vein harvest, left leg.   SURGEON:  Salvatore Decent. Dorris Fetch, M.D.   ASSISTANT:  Theda Belfast, PA   ANESTHESIA:  General.   FINDINGS:  Good quality target vessels, good quality conduits.  Right  saphenous vein was small and was not harvested.  Left saphenous vein was  of good quality.  Left ventricular hypertrophy and dilatation.  Transesophageal echocardiography revealed severe left ventricular  dysfunction with ejection fraction of roughly 25-30% with global  hypokinesis.   CLINICAL NOTE:  [name] is a 70 year old woman who presented with  shortness of breath, presumptive diagnosis of COPD flare; however, the  patient was found during her workup to have critical three-vessel  coronary disease with severe ischemic cardiomyopathy.  She was advised  to undergo coronary artery bypass grafting.  The indications, risks,  benefits, and alternatives as well as the high-risk nature of the  procedure given her COPD with an acute flare of steroid utilization,  thrombocytopenia, and severe ischemic cardiomyopathy were noted.  She  accepted the high risk and agreed to  proceed.   OPERATIVE NOTE:  [name] was brought to the preop holding area on  November 21, 2007.  There, the anesthesia service under the direction of  Dr. Sheldon Silvan placed lines for monitoring arterial, central venous,  and pulmonary arterial pressure.  Intravenous antibiotics were  administered.  She was taken to the operating room, anesthetized and  intubated.  Transesophageal echocardiography was performed that revealed  no mitral regurgitation.  The aortic valve was also within normal  limits.  There was global hypokinesis with left ventricular hypertrophy  and dilatation.   The chest, abdomen, and legs were prepped and draped in usual fashion.  A median sternotomy was performed.  The left internal mammary artery was  harvested using standard technique.  Simultaneously, an incision was  made in the medial aspect of the right leg.  The greater saphenous vein  there was small.  Therefore, the left leg was explored at the level of  the knee, and the vein was of much better quality.  The right leg was  not further operated on.  The incision was closed.  The left leg was  utilized for the saphenous vein harvest which was performed  endoscopically.  5000 units of heparin was administered during the  vessel harvest.   The pericardium was opened.  The remainder of the full heparin dose was  administered.  The ascending aorta was inspected.  There was no palpable  atherosclerotic disease.  The aorta was cannulated via concentric 2-0  pledgeted pursestring sutures.  A dual stage venous cannula was placed  via a pursestring suture in the right atrial appendage.  Cardiopulmonary  bypass was instituted, and the patient was cooled to 32 degrees Celsius.  The coronary arteries were inspected and anastomotic sites were chosen.  The conduits were inspected and cut to length.  A foam pad was placed in  the pericardium to protect the left phrenic nerve.  A temperature probe  was placed in the  myocardial septum.  A cardioplegia cannula was placed  in the ascending aorta.  A retrograde cardioplegia cannula was placed  via pursestring suture in the right atrium and directed into the  coronary sinus.   The aorta was crossclamped.  The left ventricle was emptied via the  aortic root vent.  Cardiac arrest was achieved with a combination of  cold antegrade and retrograde blood cardioplegia and topical iced  saline.  500 mL of cardioplegia was administered antegrade initially.  There was a rapid diastolic arrest, then an additional 500 mL of  cardioplegia was administered via the retrograde cannula, maintaining  flows and pressures per protocol.  There was good myocardial septal  cooling down to 11 degrees Celsius.  The following distal anastomoses  were performed.   First, a reversed saphenous vein graft was placed end-to-side to the  posterior descending branch of the right coronary.  This was a 1.5-mm  target.  The vein graft was anastomosed end-to-side with a running 7-0  Prolene suture.  All targets were good quality vessels.  At the site of  the anastomosis, there was some distal disease within the posterior  descending, however.  At the completion of the anastomosis, each was  probed proximally and distally, and then cardioplegia was administered  down each of the vein grafts at their completion.   Next, a reverse saphenous vein graft was placed sequentially to obtuse  marginals one and two.  Obtuse marginal one was a 2-mm vessel.  There  was some plaquing, but it  was still a good quality vessel.  A side-to-  side anastomosis was performed with a running 7-0 Prolene suture.  The  end-to-side anastomosis then was performed to obtuse marginal two which  was a 1.5-mm vessel.  This appeared to be more of an AV groove branch on  catheterization but was accessible and bypassable intraoperatively.  The  vein was anastomosed end-to-side with a running 7-0 Prolene suture.   Next,  a reverse saphenous vein graft was placed end-to-side to the first  diagonal.  This was a high first diagonal branch that was a 1.5-mm  vessel.  The vein graft was anastomosed with a running 7-0 Prolene  suture.   Next, the left internal mammary artery was brought through a window in  the pericardium.  The distal end was beveled and was anastomosed end-to-  side to the distal LAD.  The LAD was totally occluded proximally.  It  was 1.5-mm vessel with minimal plaquing at the site of the anastomosis.  The mammary was anastomosed end-to-side with a running 8-0 Prolene  suture.  At the completion of the mammary to LAD anastomosis, the  bulldog clamps were briefly removed to inspect for hemostasis.  Immediate rapid septal rewarming was noted.  The mammary pedicle was  tacked to the epicardial surface of the heart with 6-0 Prolene sutures,  and the bulldog clamp was replaced on the left mammary artery.   Additional cardioplegia was administered.  The vein grafts were cut to  length.  The cardioplegia cannula was removed from the ascending aorta,  and the proximal vein graft anastomoses were performed with 4.5-mm punch  aortotomies with running 6-0 Prolene sutures.  At the completion of the  final proximal anastomosis, the patient was placed in Trendelenburg  position.  The aortic root was de-aired, and the aortic crossclamp was  removed.  Total crossclamp time was 77 minutes.   The patient required a single defibrillation with 20 joules.  While the  patient was being rewarmed, all proximal and distal anastomoses were  inspected for hemostasis.  Epicardial pacing wires were placed on the  right ventricle and right atrium, and DDD pacing was initiated for  complete heart block.  A dopamine infusion was initiated at 5 mcg/kg per  minute.  When the patient had rewarmed to a core temperature of 37  degrees Celsius, she was weaned from cardiopulmonary bypass on the first  attempt.  Total bypass  time was 119 minutes.  Initial cardiac index was  greater than 2 liters per minute per meter squared.  Post-bypass  transesophageal echocardiography revealed essentially no change in left  ventricular function.   A test dose of protamine was administered and was well tolerated.  The  atrial and aortic cannulae were removed.  The remainder of the protamine  was administered without incident.  Platelets were transfused for  thrombocytopenia.  Hemostasis was achieved.  The chest was irrigated  with 1 liter of warm normal saline containing 1 gram of vancomycin.  A  left pleural and two mediastinal chest tubes were placed through  separate subcostal incisions.  The sternum was closed with a combination  of single and double heavy gauge stainless steel wires.  The pectoralis  fascia, subcutaneous tissue, and skin were closed in standard fashion.  Subcuticular skin closure was utilized.  All sponge, needle, and  instrument counts were correct at end of the procedure.   The patient was taken from the operating room to the surgical intensive  care unit in critical but stable condition.      Salvatore Decent Dorris Fetch, M.D.  Electronically Signed     SCH/MEDQ  D:  11/21/2007  T:  11/22/2007  Job:  [id number]   cc:   [name]. Excell Seltzer, MD  [facility]

## 2011-03-03 NOTE — Assessment & Plan Note (Signed)
OFFICE VISIT   [name], [name]  DOB:  Mar 09, 1941                                        December 12, 2007  CHART #:  [id number]   REASON FOR VISIT:  The patient is a 70 year old woman who had coronary  bypass grafting x5 on February 2nd. She was seen in the office a few  days after discharge by Dr. Tyrone Sage and had cellulitis in her left  lower extremity. She was started on Keflex for that, and then I saw her  back in the office on Friday, at which time she had only been on the  antibiotics for about 36 hours. She states that since then she has noted  swelling around the incision in her right leg at the level of the knee.  She still has some swelling in the left leg and still some warmth and  tenderness in that area.   PHYSICAL EXAMINATION:  The patient is a 70 year old woman in no acute  distress. Her blood pressure is 145/87, pulse 86, respirations are 18,  and her oxygen saturation is 91%. She is afebrile. Sternal incisions:  Clean, dry, and intact. Sternum is stable. Chest tube sites are healing.  She has a seroma at her right knee and also has continued cellulitis,  although slightly improved, it has not resolved in the left lower leg.   Chest x-ray shows improved aeration and decreased effusions bilaterally.   IMPRESSION:  The patient is making progress, but I would like to see  more dramatic progress of the cellulitis in her left leg. I am going to  change her antibiotic to Cipro 500 mg p.o. b.i.d. for one week. She is  to stop taking the Keflex. She still has a little bit of volume  overload. She has some edema in her feet. I am going to start her on  Lasix 20 mg daily. She prefers not to take a potassium tablet so I  advised her on dietary means of supplementing her potassium. I do not  think it will be a big issue with a low dose of only 20 mg of Lasix. I  am going to plan to see her  back in two weeks. We will check on her progress at that time.  She knows  to call if she experiences any increasing pain or tenderness or redness  around the right knee or in the left leg.   Salvatore Decent Dorris Fetch, M.D.  Electronically Signed   SCH/MEDQ  D:  12/12/2007  T:  12/12/2007  Job:  [id number]

## 2011-03-03 NOTE — Discharge Summary (Signed)
NAME:  [name], [name]                 ACCOUNT NO.:  [id number]   MEDICAL RECORD NO.:  [id number]          PATIENT TYPE:  INP   LOCATION:  2015                         FACILITY:  [facility]   PHYSICIAN:  Salvatore Decent. Dorris Fetch, M.D.DATE OF BIRTH:  [birth year]   DATE OF ADMISSION:  11/12/2007  DATE OF DISCHARGE:                               DISCHARGE SUMMARY   ADDENDUM DISCHARGE SUMMARY   This is an addendum to the patient's discharge summary dictated November 25, 2007.  The patient was  possibly ready for discharge home over the  weekend but this was held due to slow to progress.  The patient noted to  have acute blood loss anemia with hemoglobin and hematocrit at 6.6 and  19.1 on postoperative day #4.  One unit of packed red blood cells was  ordered for transfusion.  Hemoglobin and hematocrit were rechecked the  following day and had elevated slightly to 8.2 and 24.5.  The patient  was encouraged to ambulate with cardiac rehab.  She complained of being  weak with ambulation.  Hemoglobin and hematocrit were followed.  By  postoperative day #6 this had improved to 9.4 and 28.0.  The patient was  continued on p.o. iron.  The patient continued to work with cardiac  rehab to improve her strength.  She was ambulating with assistance using  rolling walker prior to discharge.  She was able to ambulate off oxygen  with sat's greater than 90% on room air.  The patient was also noted to  develop bronchitis postoperatively.  She was started on ciprofloxacin.  Her pulmonary status was improving and she was continued on  ciprofloxacin.  We will plan a 10 day course.  The patient was also  continued on her albuterol and Spiriva nebulizer's.  Diuretics were  continued for volume overload.  The patient was back near baseline prior  to discharge home.  The patient was tolerating her diet well and no  nausea or vomiting were noted.  On postoperative day #7, November 28, 2007, the patient was noted to be  afebrile.  Heart rate and blood  pressure are stable.  Currently at rest, 96% on 2L but will continue to  wean oxygen off.  As stated above was able to ambulate off oxygen sating  greater than 90%.  Last labs prior to postoperative day #6 showed white  count of 17.8, hemoglobin 9.4, hematocrit 28.0, glucose 166.  Sodium of  144, potassium 3.9, chloride of 108, bicarb of 30, BUN 15, creatinine  0.73, glucose of 108.  The patient was noted to be in normal sinus  rhythm.  Pulmonary status was stable with coarse rhonchi bilaterally.  All incisions were clean, dry and intact and healing well.  The patient  is tentatively ready for discharge home in the A.M. on postoperative day  #8, November 29, 2007.  Please see dictated discharge summary for  followup appointments and discharge instructions.   DISCHARGE MEDICATIONS:  1. Aspirin 325 mg daily.  2. Coreg 6.25 mg b.i.d.  3. Lisinopril 2.5 mg b.i.d.  4. Lipitor 40 mg at  night.  5. Ciprofloxacin 500 mg b.i.d. x6 days.  6. Niferex 150 mg daily.  7. Lasix 40 mg daily x5 days.  8. Potassium chloride 20 mEq daily x5 days.  9. Albuterol nebulizer use q.6h.  10.Spiriva 18 mcg use daily.  11.Oxycodone 5 mg one to two tabs q.4-6h. p.r.n.      Theda Belfast, PA      Salvatore Decent. Dorris Fetch, M.D.  Electronically Signed    KMD/MEDQ  D:  11/28/2007  T:  11/29/2007  Job:  [id number]   cc:   [name]. Excell Seltzer, MD

## 2011-03-03 NOTE — Assessment & Plan Note (Signed)
OFFICE VISIT   [name], [name]  DOB:  01-08-1941                                        December 09, 2007  CHART #:  [id number]   OFFICE NOTE:  The patient returns today for followup.  She recently had  coronary bypass grafting on February 2nd.  She was seen in the office on  Wednesday of this week by Dr. Tyrone Sage with a complaint of redness and  swelling in her left lower leg, the site where she had a saphenous vein  harvest from.  She was diagnosed with a wound infection, cellulitis, and  was started on Keflex.  She now returns for followup of that.  She  states that she has been having some pain in that area.  The redness and  swelling have leveled off but not resolved.  She has not had any  problems with her sternal incision in regards to pain or swelling or  redness.   PHYSICAL EXAMINATION:  General:  The patient was a 70 year old female in  no acute distress.  Vital signs:  Her blood pressure was 136/80, pulse  88, respirations 20, and oxygen saturation is 92% on room air.  Chest:  Her sternal incision is clean, dry, and intact and healing well.  The  sternum is stable.  Lungs:  Showed diminished breath sounds at both  bases, otherwise clear.  Extremities:  Her leg incision, there is  induration, erythema, warmth, and tenderness in the medial aspect of the  left leg.  There is no fluctuance.   IMPRESSION:  1. Cellulitis and possible infected seroma, left leg.  She is      currently on Keflex and has only been on that for about 36 hours      now.  She has noticed that the infection has stabilized and      certainly not worsened since she started the Keflex.  I do not      think we have had quite enough time to establish whether or not      this is going to be responsive to that and whether rather than      change her antibiotics at this point and incur additional costs for      her, we are going to continue with the Keflex and I will plan to      see  her on Monday.  If the incision is resolving at that point, we      will just complete the Keflex course.  If it is not improving at      that point, we will try an alternate antibiotic.  2. She had bilateral pleural effusions and still has diminished breath      sounds at the bases and      we will look at that on Monday as well.  We will go ahead and get a      chest x-ray on her when she returns on Monday.   Salvatore Decent Dorris Fetch, M.D.  Electronically Signed   SCH/MEDQ  D:  12/09/2007  T:  12/09/2007  Job:  [id number]   cc:   [name]. Excell Seltzer, MD

## 2011-03-03 NOTE — Assessment & Plan Note (Signed)
[facility]                            CARDIOLOGY OFFICE NOTE   NAME:[name], [name]                          MRN:          [id number]  DATE:08/20/2008                            DOB:          12-Jun-1941    REASON FOR VISIT:  Followup coronary artery disease and congestive heart  failure.   HISTORY OF PRESENT ILLNESS:  [name] is a 70 year old woman who has  an ischemic cardiomyopathy.  She underwent coronary bypass surgery for  severe multivessel CAD back in February of this year.  She presents  today for followup.  The patient complains of exertional dyspnea.  She  also complains of decreased energy.  She is able to do housework for  approximately 30 minutes and then becomes tired and also feels short of  breath.  She denies orthopnea, PND, or edema.  She denies coughs.  She  reports that her dyspnea is typically worse at this time of here and  related this to her asthma.  She denies chest pain or chest pressure.  She has discontinued cigarettes since her bypass surgery earlier this  year but unfortunately has gained almost 30 pounds since that time.   MEDICATIONS:  1. Coreg 6.25 mg twice daily.  2. Multivitamin 1 daily.  3. Aspirin 81 mg daily.  4. Nu-Iron 1 daily.  5. Lasix 40 mg daily.  6. Potassium 10 mEq daily.  7. Lisinopril 20 mg daily.  8. Simvastatin 40 mg at bedtime.   ALLERGIES:  NKDA.   PHYSICAL EXAMINATION:  GENERAL:  The patient is alert and oriented.  She  is in no acute distress.  VITAL SIGNS:  Weight is 211 pounds, blood pressure 138/70, heart rate is  72, and respiratory rate is 16.  HEENT:  Normal.  NECK:  Normal carotid upstrokes, no bruits, JVP normal.  LUNGS:  Clear bilaterally.  HEART:  Regular rate and rhythm.  No murmurs or gallops.  ABDOMEN:  Soft, obese, nontender, no organomegaly.  EXTREMITIES:  No clubbing, cyanosis, or edema.  Pedal pulses are 2+ and  equal.  The left medial calf area has mild discoloration  and tenderness  to palpation.   ASSESSMENT:  1. Coronary artery disease status post coronary artery bypass      grafting.  Continue aspirin, lisinopril, Coreg, and simvastatin.      The patient appears to be tolerating her medical regimen well.  She      has moderate residual left ventricular dysfunction with an left      ventricular ejection fraction of 35-40%.  She appears well      compensated and euvolemic by exam.  2. Dyslipidemia.  Medication caused was an issue brought up in her      last visit, and I changed her from Lipitor to Zocor.  She is due      for followup lipids and these will be drawn today.  3. Chronic systolic heart failure.  The patient remains well      compensated.  Continue current medical therapy.  4. Shortness of breath.  I suspect this is  predominantly due to her      weight gain and she has gained 30 pounds since March.  She also      reports a history of asthma and has responded well to      bronchodilators in the past.  She has no primary care physician, so      I took the liberty of prescribing her an albuterol metered-dose      inhaler.  I have asked her to try to obtain a primary care      physician as well.   FOLLOWUP:  I would like to see her back in 6 months.     [name]. Excell Seltzer, MD  Electronically Signed    MDC/MedQ  DD: 08/20/2008  DT: 08/21/2008  Job #: [id number]

## 2011-03-03 NOTE — H&P (Signed)
NAMEShakeria, [name]                 ACCOUNT NO.:  [id number]   MEDICAL RECORD NO.:  [id number]          PATIENT TYPE:  EMS   LOCATION:  ED                           FACILITY:  [name]   PHYSICIAN:  Della Goo, M.D. DATE OF BIRTH:  1940/12/18   DATE OF ADMISSION:  11/12/2007  DATE OF DISCHARGE:                              HISTORY & PHYSICAL   PRIMARY CARE PHYSICIAN:  Unassigned.   CHIEF COMPLAINT:  Shortness of breath.   HISTORY OF PRESENT ILLNESS:  This is a 70 year old female presenting to  the emergency department with complaints of severe shortness of breath  for over a week.  She reports her dyspnea has been worse the past 24  hours.  She reports having a cough which has been nonproductive.  Denies  having any fevers or chills.  The patient has a history of asthma and  asthmatic bronchitis in the past.  She was hospitalized for similar  symptoms in 2004.  She was also found at that time to have  thrombocytopenia which she also has today which was thought to be  secondary to the infectious process.   PAST MEDICAL HISTORY:  1. Significant for asthma.  2. Hypertension.  3. Tobacco abuse.   PAST SURGICAL HISTORY:  None.   MEDICATIONS:  None at this time.   ALLERGIES:  NO KNOWN DRUG ALLERGIES.   SOCIAL HISTORY:  The patient is a smoker, smokes half a pack per day.  She is a nondrinker.   FAMILY HISTORY:  Positive for diabetes in her mother and hypertension in  her mother.  No history of coronary artery disease or cancer in her  family that she knows of.   PHYSICAL EXAMINATION:  GENERAL:  This is an older than stated age-  appearing 70 year old female in discomfort, but no acute distress  currently.  VITAL SIGNS:  Temperature 97.8, blood pressure 163/81, heart rate 95-  115, respirations 24-26, O2 saturations ranging from 91-97%.  HEENT:  Normocephalic, atraumatic.  Pupils are equal, round, and  reactive to light.  Extraocular muscles are intact.  Funduscopic  benign.  Oropharynx is clear.  NECK:  Supple with full range of motion.  No thyromegaly, adenopathy or  jugular venous distention.  CARDIOVASCULAR:  Tachycardic rate and rhythm.  No murmurs, gallops or  rubs.  LUNGS:  Clear to auscultation bilaterally.  ABDOMEN:  Positive bowel sounds, soft, nontender and nondistended.  EXTREMITIES:  Without clubbing, cyanosis, or edema.  NEUROLOGIC:  Nonfocal.   LABORATORY DATA:  White blood cell count 12.0, hemoglobin 16.9,  hematocrit 50.2, platelets 91, neutrophils 82%, sodium 139, potassium  3.9, chloride 106, bicarb 28, BUN 6, creatinine 0.78, glucose 145.   DIAGNOSTICS:  Chest x-ray findings reveal right lower lobe atelectasis  and bronchitic changes as well.   ASSESSMENT:  A 70 year old female being admitted with:  1. Shortness of breath.  2. Mild hypoxemia.  3. Asthma exacerbation.  4. Pneumonia.  5. Thrombocytopenia.  6. Tobacco abuse.   PLAN:  The patient will be admitted to a telemetry area for cardiac  monitoring.  Cardiac enzymes will be  performed.  The patient has been  placed on supplemental oxygen therapy along with nebulizer treatments of  Xopenex and Atrovent.  IV antibiotic therapy of Rocephin and  azithromycin have been ordered.  The patient has also been placed on an  IV steroid taper.  Her platelets will be monitored and a D-dimer will be  ordered.  Pending results of the D-dimer, further testing will ensue.  The patient has been placed on Lovenox therapy for now, but her  platelets will be monitored as well.  The patient has been counseled  regarding her tobacco usage and abuse.      Della Goo, M.D.  Electronically Signed     HJ/MEDQ  D:  11/12/2007  T:  11/12/2007  Job:  [id number]

## 2011-03-03 NOTE — Consult Note (Signed)
NAME:  [name], [name]                 ACCOUNT NO.:  [id number]   MEDICAL RECORD NO.:  [id number]          PATIENT TYPE:  INP   LOCATION:  5121                         FACILITY:  [facility]   PHYSICIAN:  [name]. Excell Seltzer, MD  DATE OF BIRTH:  [birth year]   DATE OF CONSULTATION:  DATE OF DISCHARGE:                                 CONSULTATION   REASON FOR CONSULTATION:  Congestive heart failure.   HISTORY OF PRESENT ILLNESS:  [name] is a 70 year old African  American woman who presented to the hospital on January 24 for shortness  of breath.  She complained of severe shortness of breath for  approximately 1 week with slowly progressive symptoms.  She also  complained of a nonproductive cough.  She carries a history of asthmatic  bronchitis and was treated initially for an asthma exacerbation.  During  the course of her hospitalization she has undergone a 2-D echocardiogram  that demonstrated severe left ventricular dysfunction, and we were asked  to see her in consultation for further evaluation of heart failure and  LV dysfunction.   [name] denies any history of cardiac disease.  She has had no prior  MI or hospitalizations for heart failure.  She does have longstanding  hypertension and has not been followed closely, so her blood pressure  control has been questionable at best.  She denies orthopnea or PND.  She has had mild edema in her legs but none at present.  She has noted  some improvement in her breathing since diuresis with IV Lasix.   PAST MEDICAL HISTORY:  1. Question of asthma versus COPD.  2. Essential hypertension.  3. Longstanding tobacco abuse.  4. Recurrent pneumonia.   PAST SURGICAL HISTORY:  None.   HOME MEDICATIONS:  None.   ALLERGIES:  No known drug allergies.   CURRENT HOSPITAL MEDICATIONS:  1. Norvasc 10 mg daily.  2. Klonopin 0.5 mg three times daily.  3. Clonidine 0.1 mg three times daily.  4. Lovenox 40 mg daily.  5. Combivent q.i.d.  6. Avelox  400 mg daily.  7. Protonix 40 mg b.i.d.  8. Prednisone 60 mg b.i.d.  9. Lasix 40 mg IV every 12 hours.  10.Lisinopril 5 mg daily.   SOCIAL HISTORY:  The patient lives locally in [place].  She lives  alone but has a lot of support from her children, who also live nearby.  She does not drink alcohol.  She has been a long-time smoker.   FAMILY HISTORY:  Positive for diabetes in her mother.  There is no  coronary artery disease in the family.   REVIEW OF SYSTEMS:  A complete 12-point review of systems was performed.  Pertinent positives included headache, sweats, leg pain, urinary urgency  and constipation.  All other systems were reviewed and are negative  except as detailed above.   PHYSICAL EXAMINATION:  The patient is alert and oriented.  She is in no  acute distress.  Her temperature is 97.4, heart rate 79, respiratory rate 19, blood  pressure is 160/85.  HEENT:  Normal.  NECK:  Normal carotid  upstrokes without bruits.  Jugular venous pressure  is normal.  There is no thyromegaly or thyroid nodules.  LUNGS:  Bibasilar crackles and otherwise clear.  There is no wheezing.  CARDIAC:  The heart is regular rate and rhythm.  The apex is discrete.  There is no right ventricular heave or lift.  There are no murmurs or  gallops.  ABDOMEN:  Soft, nontender, no organomegaly.  BACK:  There is no CVA tenderness.  EXTREMITIES:  There is no clubbing, cyanosis or edema.  Peripheral  pulses are 2+ and equal throughout.  SKIN:  Warm and dry without rash.  LYMPHATICS:  There is no adenopathy.  NEUROLOGIC:  Cranial nerves II-XII are intact.  Strength is 5/5 and  equal in the arms and legs.   DATA:  Platelet count 148, hemoglobin 14.9.  Creatinine 0.9 with a BUN  of 19.  Cardiac markers are normal with an MB of 2.7 and CK of 62.  BNP  was elevated at 165.  TSH was 0.6.  D-dimer was 1.05.   Chest x-ray showed cardiomegaly with mild bronchitic changes.   CT angiogram of the chest showed no  evidence of pulmonary embolism.  There was cardiomegaly with LVH.   A 2-D echocardiogram showed normal left ventricular size with severe LV  dysfunction with akinesis of the mid distal septum and hypokinesis of  the inferior wall.  The LV wall thickness was increased and Doppler  parameters were consistent with elevated LV filling pressures.   EKG shows sinus rhythm with LVH.  On   ASSESSMENT:  1. Acute systolic heart failure.  The patient has been treated with      intravenous Lasix and has been started on aggressive      antihypertensive therapy.  On my evaluation today she appears      euvolemic.  The etiology of her left ventricular dysfunction is      unknown think she requires further evaluation with an assessment of      her coronary anatomy.  She has severe segmental left ventricular      dysfunction and I am going to have a high index of suspicion for      multivessel coronary artery disease.  We will plan on a cardiac      catheterization tomorrow.  I reviewed the risks and indications as      well as the alternatives in detail with the patient and multiple      family members.  They understand and agree to proceed.  Regarding      her medical therapy, agree with continuation of lisinopril.  I      would change her Lasix dose to p.o. since she appears to be nearly      euvolemic upon evaluation.  2. Hypertension with longstanding noncompliance.  Continue Norvasc,      clonidine, lisinopril as outlined.  3. Tobacco abuse.  I reviewed the importance of smoking cessation in      detail with the patient.   Further plans pending results of cardiac catheterization.   Thanks for the opportunity to evaluate Ms. Back.  Please feel free to  call at any time with questions regarding her care.      [name]. Excell Seltzer, MD  Electronically Signed     MDC/MEDQ  D:  11/17/2007  T:  11/17/2007  Job:  [id number]

## 2011-03-03 NOTE — Discharge Summary (Signed)
NAME:  [name], [name]                 ACCOUNT NO.:  [id number]   MEDICAL RECORD NO.:  [id number]          PATIENT TYPE:  INP   LOCATION:  2015                         FACILITY:  [facility]   PHYSICIAN:  Salvatore Decent. 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   INHOSPITAL DIAGNOSES:  1. Postoperative bronchitis.  2. Acute blood loss anemia postoperatively.  3. Volume overload postoperatively.  4. Acute systolic heart failure.  5. Pneumonia.  6. Heparin induced thrombocytopenia panel positive.   SECONDARY DIAGNOSES:  1. Asthma.  2. Hypertension.  3. Chronic obstructive pulmonary disease.   OPERATIONS/PROCEDURES:  1. Cardiac catheterization.  2. Coronary artery bypass grafting x5 using a left internal mammary      artery to left anterior descending, saphenous vein graft to first      diagonal, sequential saphenous vein graft to obtuse marginal 1 and      2, saphenous vein graft to posterior descending.  3. Endoscopic vein harvest of left leg done.   HISTORY/PHYSICAL/HOSPITAL COURSE:  [name] is a 66-year African-  American female who presented to the hospital November 12, 2007 for  shortness of breath.  She was admitted with pneumonia and asthma  exacerbation, and was started on IV antibiotics as well as nebulizer  treatments.  During the course of her hospitalization, she had undergone  a 2-D echocardiogram that demonstrates severe left ventricular  dysfunction and cardiology was consulted.  Cardiology then took the  patient for cardiac catheterization for further evaluation.  This showed  severe three-vessel coronary artery disease.  Following catheterization,  Dr. Dorris Fetch was consulted.  Dr. Dorris Fetch saw and evaluated the  patient.  He discussed with the patient undergoing coronary artery  bypass grafting.  He discussed risks and benefits with the patient.  The  patient acknowledged her understanding and agreed to proceed.  Surgery  was scheduled for November 21, 2007.  For details of the patient's past  medical history and physical exam please see dictated H&P.   The patient was taken to the operating room November 21, 2007 where she  underwent coronary artery bypass grafting x5 using a left internal  mammary artery to left anterior descending, saphenous vein graft to  first diagonal, sequential saphenous vein graft to obtuse marginal 1 and  2, saphenous vein graft to posterior descending.  Endoscopic vein  harvest from left leg.  The patient tolerated this procedure well and  was transferred to the intensive care unit in stable condition.  Postoperatively, the patient was noted to be hemodynamically stable.  She remained intubated overnight.  Attempt was made at extubation on  postop day 1 and unfortunately the patient failed to extubate.  She  remained on the ventilator on the morning of postop day 1 and was  ordered to be weaned and extubated as tolerated.  She was able to be  weaned and extubated the afternoon of postop day 1 without difficulty.  The patient  was placed on nasal cannula post-extubation.  Sats were  greater than 100%.  The patient post-extubation was continued on p.o.  steroids as well as nebulizers.  Postoperative chest x-ray done and it  was stable.  The patient had minimum drainage from chest tubes and chest  tubes were able to be discontinued in a normal fashion.  Follow up chest  x-ray remained stable with no pneumothorax.   During the patient's postoperative course, she was encouraged to use her  incentive spirometer.  Currently still weaning oxygen off.  Plan for the  patient to be maintaining sats greater than 90% on room air prior to  discharge.  She did have significant mucus production postoperatively.  Felt the patient may have developed  bronchitis.  She was started on  ciprofloxacin 500 mg b.i.d. x 7 days.  Postoperatively evening of  surgery, the patient had a hemoglobin of 7.8.  One unit of packed red  blood cells was transfused at this time.  H&H was reevaluated the  following morning and improved to 8.4 and 26%.  She was noted to have  developed thrombocytopenia.  At HIT panel was checked.  This came back  to be positive.  Platelet counts were continued to be monitored and  improving prior to discharge home.  Postop day 4, platelet count is 122.  The patient's hemoglobin/hematocrit also continued to be monitored.  On  postop day 4, H&H dropped to 6.6 and 19.1.  One unit of packed red blood  cells was ordered for transfusion.  The patient was started on p.o. iron  and this will be followed.  The patient was asymptomatic with the acute  blood loss anemia.  Guaiac stools also sent which are currently pending.   Postoperatively, the patient was noted to have leukocytosis.  She was  afebrile and no signs of infection noted.  It was felt that the  leukocytosis was secondary to p.o. steroids being given.  The patient  was on a steroid taper dose and leukocytosis was monitored.  It was  trending down prior to discharge home.  The patient also developed  slight volume overload postoperatively.  She was started on diuretics.  Daily weights were obtained and volume overload improved.  The patient  was working well with cardiac rehab, ambulating with assistance.  She  was tolerating diet well.  No nausea, vomiting noted.   Postop day 4, the patient was noted to be afebrile.  She was still on 3  liters nasal cannula with sats greater than 90%.  This was attempted to  be weaned.   LABORATORY DATA:  White count 20.0, hemoglobin 6.6, hematocrit 19.1,  platelet count 122, BUN and creatinine 20 and 0.87, glucose of 97.    The patient was noted to be in normal sinus rhythm.  She did have  bilateral rhonchi noted.  All incisions  are clean, dry and intact and  healing well.  The patient had minimal edema peripherally noted.  Patient is tentatively ready for discharge home within the next 48 hours  pending she remains stable and hemoglobin/hematocrit improves.   ACTIVITY:  Patient instructed no driving till released to do so, no  lifting over 10 pounds.  She is told to ambulate 3-4 times per day,  progress as tolerated and to continue her breathing exercises.   INCISIONAL CARE:  The patient is told to shower washing her incisions  using soap and water.  She is to contact the office if she develops any  drainage or opening from any of her incision sites.   DIET:  Low-fat, low-salt.   DISCHARGE MEDICATIONS:  1. Aspirin 325 mg daily.  2. Coreg 6.25 mg b.i.d.  3. Lisinopril 2.5 mg b.i.d.  4. Lipitor 40 mg at night.  5. Ciprofloxacin 500 mg b.i.d. x 5 days.  6. Niferex 150 mg daily.  7. Oxycodone 5 mg 1-2 tablets q.4-6 h. p.r.n..   FOLLOW-UP APPOINTMENTS:  1. Dr. Dorris Fetch for December 19, 2007 at 11:45 a.m.  The patient will      need to obtain PA and lateral chest x-ray 30 minutes prior to this      appointment.  2. The patient will need to follow up with Dr. Excell Seltzer in 2 weeks.  She      will need to contact Dr. Earmon Phoenix office to make these      arrangements.      Theda Belfast, PA      Salvatore Decent. Dorris Fetch, M.D.  Electronically Signed    KMD/MEDQ  D:  11/25/2007  T:  11/27/2007  Job:  [id number]   cc:   Salvatore Decent. Dorris Fetch, M.D.  [name]. Excell Seltzer, MD

## 2011-03-03 NOTE — Assessment & Plan Note (Signed)
OFFICE VISIT   Sample, Elverta  DOB:  November 14, 1940                                        December 07, 2007  CHART #:  [id number]   Ms.  Morris returns to the office today because of red swollen warm to  the touch left lower leg endo vein harvest site.  She had undergone  coronary artery bypass grafting times 5 on November 21, 2007.  She was  ultimately discharged home on Keflex 500 mg twice a day for 6 days.  Currently she is off antibiotics. She notes over the past several days  increasing erythema and tenderness  in the left lower leg endo vein  harvest site with some edema.  Otherwise she is making good progress at  home.   PHYSICAL EXAMINATION:  Temperature 98.9.  O2 SAT is 93 to 94 percent.  Blood pressure 133/74.  Her sternum is stable and well healed.  The Steri-Strips at the chest  tube sites were removed.  There is some slight separation at the chest  tube site but no active infection.  Abdominal examination is benign and  the right lower extremity is without significant edema and the left  lower extremity has some superficial bruising in the upper thigh but  without tenderness  in the mid left tibial area around the most distal  endo vein harvest puncture site.  There is erythema, some bruising, and  tenderness.  The calf itself is not tender.  She has trace edema.   I presume that she has some degree of infection in the lower leg endo  vein harvest site.  Will start her on Keflex 250 mg p.o. every 8 hours  for 7 days. She had previously been discharged home on Cipro so we did  not continue that any longer.  She will continue to keep her legs  elevated as much as possible and return to the office in 2 days for a  followup visit on the status of her wound.   Sheliah Plane, MD  Electronically Signed   EG/MEDQ  D:  12/07/2007  T:  12/08/2007  Job:  [id number]

## 2011-03-06 NOTE — Discharge Summary (Signed)
NAMELetina, [name]                             ACCOUNT NO.:  [id number]   MEDICAL RECORD NO.:  [id number]                   PATIENT TYPE:  INP   LOCATION:  0368                                 FACILITY:  [facility]   PHYSICIAN:  Jackie Plum, M.D.             DATE OF BIRTH:  [birth year]   DATE OF ADMISSION:  12/25/2002  DATE OF DISCHARGE:  12/30/2002                                 DISCHARGE SUMMARY   DISCHARGE DIAGNOSES:  1. Acute bronchial asthma exacerbation secondary to bronchitis - resolved.  2. History of bronchitis.  3. History of tobacco use.  4. History of hypertension.  5. Mild leukocytosis, outpatient follow up recommended.  6. Mild thrombocytopenia secondary to infectious process as above,     outpatient follow up recommended.   DISCHARGE MEDICATIONS:  1. Prednisone taper.  2. Combivent MDI two puffs q.i.d.  3. Hydrochlorothiazide 25 mg every day.  4. Humibid L.A. 600 mg tablet two tablets b.i.d.  5. Nicotine patch 21 mg per 24-hour patch every day.  6. Tequin 400 mg p.o. every day for two days.  7. Tylenol p.r.n.  8. Oxygen 2 liters per minute by nasal cannula with activity.   ACTIVITY:  As tolerated with oxygen as mentioned above.   DIET:  Low-salt diet.   SPECIAL INSTRUCTIONS:  The patient was asked to smoke cigarette smoking.  She received extensive counseling regarding cigarettes, both on her general  health status, particularly with respect to her pulmonary illness.  I  discussed with her the fact that oxygen is an inflammable element and that  she should avoid cigarette smoking near the oxygen tank or excessive heat.  She should report to M.D. if she experiences any problems including fever,  chills, or worsening shortness of breath.  She is to call for appointment to  be seen by a Everett Ricciardelli within the next seven to 10 days at [facility].   DISCHARGE LABORATORY DATA:  Sodium 136, potassium 3.5, chloride 100, CO2 27,  glucose 181, BUN 13,  creatinine 1, calcium 9.5.  WBC count 13.9, hemoglobin  14.8, hematocrit 38.9, platelet count of 116.   CONDITION ON DISCHARGE:  Improved and stable.   REASON FOR HOSPITALIZATION:  Acute bronchial asthma exacerbation.   HISTORY:  The patient presented to the hospital on December 25, 2002, and was  admitted by Dr. Soyla Dryer on account of shortness of breath with wheezing of  __________ duration which had been progressively worsening.  She also had  some cough which was nonproductive and had experienced some postnasal drip  earlier.   PHYSICAL EXAMINATION:  On admission was notable for:  VITAL SIGNS:  Pulse rate of 100.  LUNGS:  With expiratory wheezes diffusely and distant lung sounds.  CARDIAC:  Exam notable for sinus tachycardia without any murmur.  There was  no JVD.   CHEST X-RAY:  Notable for right lower lobe opacification consistent with  atelectasis versus infiltrate.   IMPRESSION:  The patient was therefore admitted for possible bronchial  asthma secondary to pneumonia.   HOSPITAL COURSE:  IV antibiotics were started with Tequin.  She received  supportive care including albuterol and Atrovent nebulization with oxygen  and steroids.  With these measures, the patient's leukocytosis improved  gradually and she is being discharged home today on oxygen.  Last evening,  the patient's O2 saturation on room air with activity was 84%.  Her repeat x-  ray done was notable for bronchitic changes without any obvious infiltrate.  I spent a good amount of time discussing with the patient her workup so far,  follow up needs, and also regarding. Oxygen and its possible problems  associated with cigarette smoking and she expressed understanding.  Time  spent discharging patient today was more than 35 minutes.                                               Jackie Plum, M.D.    GO/MEDQ  D:  12/30/2002  T:  12/30/2002  Job:  [id number]   cc:   Dala Dock

## 2011-03-10 ENCOUNTER — Ambulatory Visit: Payer: No Typology Code available for payment source | Admitting: Physical Therapy

## 2011-03-12 ENCOUNTER — Ambulatory Visit: Payer: No Typology Code available for payment source | Admitting: Physical Therapy

## 2011-03-17 ENCOUNTER — Ambulatory Visit: Payer: No Typology Code available for payment source | Admitting: Physical Therapy

## 2011-03-19 ENCOUNTER — Ambulatory Visit: Payer: No Typology Code available for payment source | Admitting: Physical Therapy

## 2011-03-24 ENCOUNTER — Encounter: Payer: PRIVATE HEALTH INSURANCE | Admitting: Physical Therapy

## 2011-03-26 ENCOUNTER — Ambulatory Visit: Payer: PRIVATE HEALTH INSURANCE | Admitting: Physical Therapy

## 2011-07-09 LAB — BASIC METABOLIC PANEL
BUN: 6
BUN: 14
BUN: 16
CO2: 29
CO2: 34 — ABNORMAL HIGH
Calcium: 8.1 — ABNORMAL LOW
Calcium: 8.3 — ABNORMAL LOW
Chloride: 106
Chloride: 101
Chloride: 105
Chloride: 105
Creatinine, Ser: 0.74
Creatinine, Ser: 0.82
Creatinine, Ser: 0.91
Creatinine, Ser: 1
GFR calc Af Amer: >60
GFR calc Af Amer: >60
GFR calc non Af Amer: >60
GFR calc non Af Amer: 56 — ABNORMAL LOW
GFR calc non Af Amer: >60
Glucose, Bld: 145 — ABNORMAL HIGH
Glucose, Bld: 114 — ABNORMAL HIGH
Glucose, Bld: 192 — ABNORMAL HIGH
Glucose, Bld: 200 — ABNORMAL HIGH
Potassium: 3.9
Potassium: 4
Potassium: 4.3
Sodium: 139
Sodium: 141
Sodium: 144

## 2011-07-09 LAB — POCT I-STAT 3, ART BLOOD GAS (G3+)
Acid-Base Excess: 8 — ABNORMAL HIGH
Bicarbonate: 33.4 — ABNORMAL HIGH
Operator id: 298141
TCO2: 35
pH, Arterial: 7.457 — ABNORMAL HIGH
pO2, Arterial: 65 — ABNORMAL LOW

## 2011-07-09 LAB — CBC
HCT: 50.2 — ABNORMAL HIGH
HCT: 42.3
HCT: 44.3
Hemoglobin: 16.9 — ABNORMAL HIGH
Hemoglobin: 14.2
Hemoglobin: 15.9 — ABNORMAL HIGH
MCHC: 33.2
MCHC: 33.5
MCV: 83.6
MCV: 85
MCV: 85.4
Platelets: 148 — ABNORMAL LOW
Platelets: 92 — ABNORMAL LOW
RBC: 5.56 — ABNORMAL HIGH
RDW: 14.6
RDW: 14.5
RDW: 14.9
WBC: 12 — ABNORMAL HIGH
WBC: 21.1 — ABNORMAL HIGH

## 2011-07-09 LAB — TSH: TSH: 0.644

## 2011-07-09 LAB — DIFFERENTIAL
Basophils Absolute: 0
Eosinophils Absolute: 0
Eosinophils Absolute: 0
Eosinophils Relative: 0
Eosinophils Relative: 0
Lymphocytes Relative: 14
Lymphocytes Relative: 6 — ABNORMAL LOW
Lymphs Abs: 1.6
Monocytes Absolute: 0.4

## 2011-07-09 LAB — BLOOD GAS, ARTERIAL
Acid-Base Excess: 3.3 — ABNORMAL HIGH
Acid-base deficit: 2.2 — ABNORMAL HIGH
Patient temperature: 98.6
TCO2: 23.5
TCO2: 29.2
pCO2 arterial: 45
pH, Arterial: 7.369

## 2011-07-09 LAB — LIPID PANEL
Cholesterol: 170
LDL Cholesterol: 100 — ABNORMAL HIGH
VLDL: 23

## 2011-07-09 LAB — CARDIAC PANEL(CRET KIN+CKTOT+MB+TROPI)
CK, MB: 3.4
Relative Index: INVALID
Total CK: 60
Total CK: 67
Troponin I: 0.03

## 2011-07-09 LAB — HEMOGLOBIN AND HEMATOCRIT, BLOOD: HCT: 44.6

## 2011-07-09 LAB — TYPE AND SCREEN: ABO/RH(D): O POS

## 2011-07-09 LAB — PROTIME-INR: INR: 1

## 2011-07-09 LAB — URINALYSIS, ROUTINE W REFLEX MICROSCOPIC
Bilirubin Urine: NEGATIVE
Hgb urine dipstick: NEGATIVE
Ketones, ur: NEGATIVE
Protein, ur: 100 — AB
Urobilinogen, UA: 0.2

## 2011-07-09 LAB — CK TOTAL AND CKMB (NOT AT ARMC)
CK, MB: 2.7
Relative Index: INVALID

## 2011-07-09 LAB — D-DIMER, QUANTITATIVE: D-Dimer, Quant: 1.05 — ABNORMAL HIGH

## 2011-07-09 LAB — POCT I-STAT 3, VENOUS BLOOD GAS (G3P V)
O2 Saturation: 69
TCO2: 36
pCO2, Ven: 54.3 — ABNORMAL HIGH
pO2, Ven: 36

## 2011-07-09 LAB — TROPONIN I
Troponin I: 0.08 — ABNORMAL HIGH
Troponin I: 0.08 — ABNORMAL HIGH

## 2011-07-09 LAB — APTT: aPTT: 28

## 2011-07-10 LAB — POCT I-STAT 3, ART BLOOD GAS (G3+)
Acid-Base Excess: 1
Acid-Base Excess: 1
Acid-Base Excess: 3 — ABNORMAL HIGH
Acid-Base Excess: 4 — ABNORMAL HIGH
Bicarbonate: 25.1 — ABNORMAL HIGH
Bicarbonate: 26.8 — ABNORMAL HIGH
O2 Saturation: 100
O2 Saturation: 90
O2 Saturation: 93
Operator id: 199821
Operator id: 199821
Patient temperature: 38.1
Patient temperature: 38.2
Patient temperature: 38.2
TCO2: 26
TCO2: 28
TCO2: 29
TCO2: 30
pCO2 arterial: 44.7
pH, Arterial: 7.444 — ABNORMAL HIGH
pO2, Arterial: 307 — ABNORMAL HIGH
pO2, Arterial: 54 — ABNORMAL LOW

## 2011-07-10 LAB — CBC
HCT: 19.1 — ABNORMAL LOW
HCT: 20.9 — ABNORMAL LOW
HCT: 23.5 — ABNORMAL LOW
HCT: 24.9 — ABNORMAL LOW
HCT: 25.1 — ABNORMAL LOW
HCT: 26.1 — ABNORMAL LOW
HCT: 29 — ABNORMAL LOW
HCT: 37.5
HCT: 39.7
Hemoglobin: 12.6
Hemoglobin: 13.2
Hemoglobin: 6.6 — CL
Hemoglobin: 7 — CL
Hemoglobin: 7.8 — CL
Hemoglobin: 8.2 — ABNORMAL LOW
Hemoglobin: 9.4 — ABNORMAL LOW
Hemoglobin: 9.6 — ABNORMAL LOW
MCHC: 33.2
MCHC: 33.4
MCHC: 33.5
MCHC: 33.7
MCHC: 34.3
MCV: 84.9
MCV: 85
MCV: 85.1
MCV: 85.1
MCV: 85.6
MCV: 86.5
MCV: 86.5
MCV: 88.2
Platelets: 120 — ABNORMAL LOW
Platelets: 122 — ABNORMAL LOW
Platelets: 127 — ABNORMAL LOW
Platelets: 133 — ABNORMAL LOW
Platelets: 166
Platelets: 80 — ABNORMAL LOW
Platelets: 95 — ABNORMAL LOW
Platelets: 96 — ABNORMAL LOW
RBC: 2.94 — ABNORMAL LOW
RBC: 3.4 — ABNORMAL LOW
RBC: 4.39
RBC: 4.59
RDW: 14.4
RDW: 14.6
RDW: 14.6
RDW: 14.7
RDW: 14.7
RDW: 14.7
RDW: 14.8
RDW: 16.1 — ABNORMAL HIGH
RDW: 16.2 — ABNORMAL HIGH
WBC: 17.8 — ABNORMAL HIGH
WBC: 20 — ABNORMAL HIGH
WBC: 21.3 — ABNORMAL HIGH
WBC: 26.8 — ABNORMAL HIGH
WBC: 34.9 — ABNORMAL HIGH

## 2011-07-10 LAB — I-STAT EC8
Acid-Base Excess: 1
Chloride: 106
Glucose, Bld: 112 — ABNORMAL HIGH
Hemoglobin: 7.5 — CL
Potassium: 4.1
Sodium: 143
TCO2: 27

## 2011-07-10 LAB — BASIC METABOLIC PANEL
BUN: 14
BUN: 15
BUN: 16
BUN: 18
BUN: 20
BUN: 20
CO2: 31
CO2: 32
CO2: 33 — ABNORMAL HIGH
CO2: 34 — ABNORMAL HIGH
Calcium: 6.9 — ABNORMAL LOW
Calcium: 7.4 — ABNORMAL LOW
Calcium: 7.5 — ABNORMAL LOW
Calcium: 7.6 — ABNORMAL LOW
Calcium: 7.7 — ABNORMAL LOW
Chloride: 103
Chloride: 105
Chloride: 105
Creatinine, Ser: 0.87
Creatinine, Ser: 0.88
Creatinine, Ser: 0.91
Creatinine, Ser: 0.93
GFR calc Af Amer: >60
GFR calc Af Amer: >60
GFR calc Af Amer: >60
GFR calc non Af Amer: >60
GFR calc non Af Amer: >60
GFR calc non Af Amer: >60
Glucose, Bld: 106 — ABNORMAL HIGH
Glucose, Bld: 108 — ABNORMAL HIGH
Glucose, Bld: 129 — ABNORMAL HIGH
Glucose, Bld: 142 — ABNORMAL HIGH
Sodium: 143
Sodium: 144

## 2011-07-10 LAB — CROSSMATCH: Antibody Screen: NEGATIVE

## 2011-07-10 LAB — PHOSPHORUS: Phosphorus: 3.3

## 2011-07-10 LAB — POCT I-STAT 4, (NA,K, GLUC, HGB,HCT)
Glucose, Bld: 106 — ABNORMAL HIGH
Glucose, Bld: 108 — ABNORMAL HIGH
Glucose, Bld: 118 — ABNORMAL HIGH
Glucose, Bld: 140 — ABNORMAL HIGH
HCT: 22 — ABNORMAL LOW
HCT: 23 — ABNORMAL LOW
HCT: 23 — ABNORMAL LOW
Hemoglobin: 13.3
Hemoglobin: 7.8 — CL
Operator id: 299391
Operator id: 3406
Operator id: 3406
Potassium: 3.2 — ABNORMAL LOW
Potassium: 3.5
Potassium: 4
Potassium: 4.9
Sodium: 139

## 2011-07-10 LAB — PLATELET COUNT: Platelets: 63 — ABNORMAL LOW

## 2011-07-10 LAB — COMPREHENSIVE METABOLIC PANEL
ALT: 25
Albumin: 2.3 — ABNORMAL LOW
Alkaline Phosphatase: 65
BUN: 21
Chloride: 104
Glucose, Bld: 164 — ABNORMAL HIGH
Potassium: 3.5
Total Bilirubin: 0.7

## 2011-07-10 LAB — CREATININE, SERUM
GFR calc Af Amer: >60
GFR calc non Af Amer: >60

## 2011-07-10 LAB — DIFFERENTIAL
Eosinophils Absolute: 0.1
Eosinophils Relative: 0
Lymphocytes Relative: 14
Lymphs Abs: 2.7
Monocytes Absolute: 1.3 — ABNORMAL HIGH

## 2011-07-10 LAB — PREPARE PLATELET PHERESIS

## 2011-07-10 LAB — POCT I-STAT GLUCOSE
Glucose, Bld: 100 — ABNORMAL HIGH
Glucose, Bld: 122 — ABNORMAL HIGH
Operator id: 151361
Operator id: 3406

## 2011-07-10 LAB — HEPARIN INDUCED PLATELET AGGREGATION (CONVERTED LAB)
Heparin 100 Donor: 2
Saline Donor:: 3
Saline Patient:: 6

## 2011-07-10 LAB — HEPARIN ASSOCIATED ANTIBODY DETECTION (CONVERTED LAB): Heparin Associated Ab Detection: POSITIVE — AB

## 2011-07-10 LAB — MAGNESIUM
Magnesium: 2.3
Magnesium: 2.5

## 2011-07-10 LAB — APTT: aPTT: 34

## 2011-07-10 LAB — PROTIME-INR: INR: 1.4

## 2011-07-10 LAB — HEMOGLOBIN A1C: Hgb A1c MFr Bld: 6.5 — ABNORMAL HIGH

## 2011-07-20 ENCOUNTER — Ambulatory Visit: Payer: PRIVATE HEALTH INSURANCE | Admitting: Pulmonary Disease

## 2011-08-27 ENCOUNTER — Encounter: Payer: Self-pay | Admitting: Pulmonary Disease

## 2011-08-27 ENCOUNTER — Ambulatory Visit (INDEPENDENT_AMBULATORY_CARE_PROVIDER_SITE_OTHER): Payer: No Typology Code available for payment source | Admitting: Pulmonary Disease

## 2011-08-27 VITALS — BP 106/62 | HR 86 | Temp 97.5°F | Ht 64.0 in | Wt 218.8 lb

## 2011-08-27 DIAGNOSIS — Z23 Encounter for immunization: Secondary | ICD-10-CM

## 2011-08-27 DIAGNOSIS — J438 Other emphysema: Secondary | ICD-10-CM

## 2011-08-27 NOTE — Progress Notes (Signed)
Addended by: Charlott Holler on: 08/27/2011 12:33 PM   Modules accepted: Orders

## 2011-08-27 NOTE — Progress Notes (Signed)
  Subjective:    Patient ID: [name], female    DOB: 04/03/1941, 70 y.o.   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  She is continuing on her bronchodilator regimen, and feels that her dyspnea on exertion is at baseline.  She denies a significant cough or chest congestion, and has not had a flareup or chest cold since the last visit.  She has been trying to stay as active as possible.   Review of Systems  Constitutional: Negative for fever and unexpected weight change.  HENT: Negative for ear pain, nosebleeds, congestion, sore throat, rhinorrhea, sneezing, trouble swallowing, dental problem, postnasal drip and sinus pressure.   Eyes: Negative for redness and itching.  Respiratory: Positive for shortness of breath and wheezing. Negative for cough and chest tightness.   Cardiovascular: Negative for palpitations and leg swelling.  Gastrointestinal: Negative for nausea and vomiting.  Genitourinary: Negative for dysuria.  Musculoskeletal: Negative for joint swelling.  Skin: Negative for rash.  Neurological: Positive for headaches.  Hematological: Does not bruise/bleed easily.  Psychiatric/Behavioral: Negative for dysphoric mood. The patient is not nervous/anxious.        Objective:   Physical Exam Obese female in no acute distress Nose without discharge or purulence noted Chest with decreased breath sounds, but no wheezes or rhonchi Cardiac exam with regular rate and rhythm Lower extremities with mild edema, no cyanosis noted  Alert and oriented, moves all 4 extremities.       Assessment & Plan:

## 2011-08-27 NOTE — Patient Instructions (Signed)
Will give you a flu shot today No change in breathing medications Work on weight loss and conditioning. followup with me in 6mos, but call if having breathing issues.

## 2011-08-27 NOTE — Assessment & Plan Note (Signed)
The patient is doing about as well as can be expected from a pulmonary standpoint.  She is on a good bronchodilator regimen, and has not had a recent exacerbation.  I have encouraged her to work aggressively on weight loss and conditioning.  We'll give her the flu shot today.

## 2011-09-13 ENCOUNTER — Emergency Department (HOSPITAL_COMMUNITY): Payer: PRIVATE HEALTH INSURANCE

## 2011-09-13 ENCOUNTER — Encounter (HOSPITAL_COMMUNITY): Payer: Self-pay | Admitting: Emergency Medicine

## 2011-09-13 ENCOUNTER — Inpatient Hospital Stay (HOSPITAL_COMMUNITY)
Admission: EM | Admit: 2011-09-13 | Discharge: 2011-09-16 | DRG: 193 | Disposition: A | Discharge status: Home Health | Payer: PRIVATE HEALTH INSURANCE | Attending: Internal Medicine | Admitting: Internal Medicine

## 2011-09-13 DIAGNOSIS — I2589 Other forms of chronic ischemic heart disease: Secondary | ICD-10-CM | POA: Diagnosis present

## 2011-09-13 DIAGNOSIS — J441 Chronic obstructive pulmonary disease with (acute) exacerbation: Secondary | ICD-10-CM | POA: Diagnosis present

## 2011-09-13 DIAGNOSIS — J9620 Acute and chronic respiratory failure, unspecified whether with hypoxia or hypercapnia: Secondary | ICD-10-CM | POA: Diagnosis present

## 2011-09-13 DIAGNOSIS — E785 Hyperlipidemia, unspecified: Secondary | ICD-10-CM | POA: Diagnosis present

## 2011-09-13 DIAGNOSIS — J438 Other emphysema: Secondary | ICD-10-CM

## 2011-09-13 DIAGNOSIS — G4733 Obstructive sleep apnea (adult) (pediatric): Secondary | ICD-10-CM | POA: Diagnosis present

## 2011-09-13 DIAGNOSIS — I2510 Atherosclerotic heart disease of native coronary artery without angina pectoris: Secondary | ICD-10-CM | POA: Diagnosis present

## 2011-09-13 DIAGNOSIS — R0602 Shortness of breath: Secondary | ICD-10-CM | POA: Diagnosis present

## 2011-09-13 DIAGNOSIS — Z7982 Long term (current) use of aspirin: Secondary | ICD-10-CM

## 2011-09-13 DIAGNOSIS — J449 Chronic obstructive pulmonary disease, unspecified: Secondary | ICD-10-CM | POA: Diagnosis present

## 2011-09-13 DIAGNOSIS — D696 Thrombocytopenia, unspecified: Secondary | ICD-10-CM | POA: Diagnosis present

## 2011-09-13 DIAGNOSIS — Z79899 Other long term (current) drug therapy: Secondary | ICD-10-CM

## 2011-09-13 DIAGNOSIS — D72829 Elevated white blood cell count, unspecified: Secondary | ICD-10-CM | POA: Diagnosis present

## 2011-09-13 DIAGNOSIS — I25810 Atherosclerosis of coronary artery bypass graft(s) without angina pectoris: Secondary | ICD-10-CM | POA: Diagnosis present

## 2011-09-13 DIAGNOSIS — I5022 Chronic systolic (congestive) heart failure: Secondary | ICD-10-CM | POA: Diagnosis present

## 2011-09-13 DIAGNOSIS — I10 Essential (primary) hypertension: Secondary | ICD-10-CM | POA: Diagnosis present

## 2011-09-13 DIAGNOSIS — Z87891 Personal history of nicotine dependence: Secondary | ICD-10-CM

## 2011-09-13 DIAGNOSIS — J189 Pneumonia, unspecified organism: Principal | ICD-10-CM | POA: Diagnosis present

## 2011-09-13 LAB — BASIC METABOLIC PANEL
BUN: 15 mg/dL (ref 6–23)
CO2: 19 mEq/L (ref 19–32)
Chloride: 107 mEq/L (ref 96–112)
GFR calc Af Amer: 68 mL/min — ABNORMAL LOW (ref >90)
Glucose, Bld: 207 mg/dL — ABNORMAL HIGH (ref 70–99)
Potassium: 3.6 mEq/L (ref 3.5–5.1)

## 2011-09-13 LAB — CARDIAC PANEL(CRET KIN+CKTOT+MB+TROPI)
Relative Index: 0.6 (ref 0.0–2.5)
Relative Index: 0.5 (ref 0.0–2.5)
Total CK: 633 U/L — ABNORMAL HIGH (ref 7–177)
Total CK: 629 U/L — ABNORMAL HIGH (ref 7–177)

## 2011-09-13 LAB — POCT I-STAT 3, ART BLOOD GAS (G3+)
Bicarbonate: 21.9 mEq/L (ref 20.0–24.0)
Patient temperature: 98.7
pCO2 arterial: 36.9 mmHg (ref 35.0–45.0)
pH, Arterial: 7.381 (ref 7.350–7.400)
pO2, Arterial: 260 mmHg — ABNORMAL HIGH (ref 80.0–100.0)

## 2011-09-13 LAB — CREATININE, SERUM
GFR calc Af Amer: 52 mL/min — ABNORMAL LOW (ref >90)
GFR calc non Af Amer: 45 mL/min — ABNORMAL LOW (ref >90)

## 2011-09-13 LAB — CBC
HCT: 41.2 % (ref 36.0–46.0)
HCT: 40.9 % (ref 36.0–46.0)
Hemoglobin: 13.8 g/dL (ref 12.0–15.0)
RBC: 4.77 MIL/uL (ref 3.87–5.11)
RBC: 4.69 MIL/uL (ref 3.87–5.11)
RDW: 14.1 % (ref 11.5–15.5)
WBC: 19.7 10*3/uL — ABNORMAL HIGH (ref 4.0–10.5)

## 2011-09-13 LAB — DIFFERENTIAL
Lymphs Abs: 0.9 10*3/uL (ref 0.7–4.0)
Monocytes Absolute: 0.9 10*3/uL (ref 0.1–1.0)
Monocytes Relative: 5 % (ref 3–12)
Neutro Abs: 15.6 10*3/uL — ABNORMAL HIGH (ref 1.7–7.7)
Neutrophils Relative %: 89 % — ABNORMAL HIGH (ref 43–77)

## 2011-09-13 LAB — INFLUENZA PANEL BY PCR (TYPE A & B)
H1N1 flu by pcr: NOT DETECTED
Influenza B By PCR: NEGATIVE

## 2011-09-13 LAB — PRO B NATRIURETIC PEPTIDE: Pro B Natriuretic peptide (BNP): 879.2 pg/mL — ABNORMAL HIGH (ref 0–125)

## 2011-09-13 MED ORDER — CEFTRIAXONE SODIUM IN DEXTROSE 20 MG/ML IV SOLN
500.0000 mg | INTRAVENOUS | Status: AC
  Administered 2011-09-13: 500 mg via INTRAVENOUS
  Filled 2011-09-13: qty 50

## 2011-09-13 MED ORDER — MORPHINE SULFATE 2 MG/ML IJ SOLN
2.0000 mg | INTRAMUSCULAR | Status: DC | PRN
  Administered 2011-09-13 – 2011-09-16 (×6): 2 mg via INTRAVENOUS
  Filled 2011-09-13 (×6): qty 1

## 2011-09-13 MED ORDER — ASPIRIN 81 MG PO CHEW
81.0000 mg | CHEWABLE_TABLET | Freq: Every day | ORAL | Status: DC
  Administered 2011-09-13 – 2011-09-16 (×4): 81 mg via ORAL
  Filled 2011-09-13 (×4): qty 1

## 2011-09-13 MED ORDER — BUDESONIDE-FORMOTEROL FUMARATE 160-4.5 MCG/ACT IN AERO
2.0000 | INHALATION_SPRAY | Freq: Two times a day (BID) | RESPIRATORY_TRACT | Status: DC
  Administered 2011-09-13 – 2011-09-15 (×5): 2 via RESPIRATORY_TRACT
  Filled 2011-09-13: qty 6

## 2011-09-13 MED ORDER — ENOXAPARIN SODIUM 40 MG/0.4ML ~~LOC~~ SOLN
40.0000 mg | SUBCUTANEOUS | Status: DC
  Administered 2011-09-13 – 2011-09-15 (×3): 40 mg via SUBCUTANEOUS
  Filled 2011-09-13 (×4): qty 0.4

## 2011-09-13 MED ORDER — OMEGA-3-ACID ETHYL ESTERS 1 G PO CAPS
1.0000 g | ORAL_CAPSULE | Freq: Two times a day (BID) | ORAL | Status: DC
  Administered 2011-09-13 – 2011-09-16 (×7): 1 g via ORAL
  Filled 2011-09-13 (×8): qty 1

## 2011-09-13 MED ORDER — DEXTROSE 5 % IV SOLN
500.0000 mg | Freq: Once | INTRAVENOUS | Status: AC
  Administered 2011-09-13: 500 mg via INTRAVENOUS
  Filled 2011-09-13: qty 500

## 2011-09-13 MED ORDER — ALLOPURINOL 100 MG PO TABS
100.0000 mg | ORAL_TABLET | Freq: Every day | ORAL | Status: DC
  Administered 2011-09-13 – 2011-09-16 (×4): 100 mg via ORAL
  Filled 2011-09-13 (×4): qty 1

## 2011-09-13 MED ORDER — SODIUM CHLORIDE 0.9 % IJ SOLN
3.0000 mL | Freq: Two times a day (BID) | INTRAMUSCULAR | Status: DC
  Administered 2011-09-13 (×2): 3 mL via INTRAVENOUS

## 2011-09-13 MED ORDER — TIOTROPIUM BROMIDE MONOHYDRATE 18 MCG IN CAPS
18.0000 ug | ORAL_CAPSULE | Freq: Every day | RESPIRATORY_TRACT | Status: DC
  Administered 2011-09-14 – 2011-09-15 (×2): 18 ug via RESPIRATORY_TRACT
  Filled 2011-09-13: qty 5

## 2011-09-13 MED ORDER — ALBUTEROL SULFATE (5 MG/ML) 0.5% IN NEBU
2.5000 mg | INHALATION_SOLUTION | Freq: Four times a day (QID) | RESPIRATORY_TRACT | Status: AC
  Administered 2011-09-13: 2.5 mg via RESPIRATORY_TRACT
  Filled 2011-09-13: qty 0.5

## 2011-09-13 MED ORDER — DEXTROSE 5 % IV SOLN
500.0000 mg | INTRAVENOUS | Status: DC
  Administered 2011-09-14 – 2011-09-16 (×3): 500 mg via INTRAVENOUS
  Filled 2011-09-13 (×3): qty 500

## 2011-09-13 MED ORDER — PHENOL 1.4 % MT LIQD
1.0000 | OROMUCOSAL | Status: DC | PRN
  Administered 2011-09-13: 1 via OROMUCOSAL
  Filled 2011-09-13: qty 177

## 2011-09-13 MED ORDER — SODIUM CHLORIDE 0.9 % IJ SOLN: 3.0000 mL | INTRAMUSCULAR | Status: DC | PRN

## 2011-09-13 MED ORDER — ONDANSETRON HCL 4 MG PO TABS: 4.0000 mg | ORAL_TABLET | Freq: Four times a day (QID) | ORAL | Status: DC | PRN

## 2011-09-13 MED ORDER — LORATADINE 10 MG PO TABS
10.0000 mg | ORAL_TABLET | Freq: Every day | ORAL | Status: DC
  Administered 2011-09-13 – 2011-09-16 (×4): 10 mg via ORAL
  Filled 2011-09-13 (×4): qty 1

## 2011-09-13 MED ORDER — SIMVASTATIN 40 MG PO TABS
40.0000 mg | ORAL_TABLET | Freq: Every day | ORAL | Status: DC
  Administered 2011-09-13 – 2011-09-15 (×3): 40 mg via ORAL
  Filled 2011-09-13 (×4): qty 1

## 2011-09-13 MED ORDER — CARVEDILOL 6.25 MG PO TABS
6.2500 mg | ORAL_TABLET | Freq: Two times a day (BID) | ORAL | Status: DC
  Administered 2011-09-13 – 2011-09-16 (×7): 6.25 mg via ORAL
  Filled 2011-09-13 (×9): qty 1

## 2011-09-13 MED ORDER — LISINOPRIL 10 MG PO TABS
10.0000 mg | ORAL_TABLET | Freq: Every day | ORAL | Status: DC
  Administered 2011-09-14 – 2011-09-16 (×3): 10 mg via ORAL
  Filled 2011-09-13 (×3): qty 1

## 2011-09-13 MED ORDER — POTASSIUM CHLORIDE CRYS ER 10 MEQ PO TBCR: 10.0000 meq | EXTENDED_RELEASE_TABLET | Freq: Once | ORAL | Status: DC

## 2011-09-13 MED ORDER — DEXTROSE 5 % IV SOLN
INTRAVENOUS | Status: AC
  Filled 2011-09-13: qty 10

## 2011-09-13 MED ORDER — LISINOPRIL 20 MG PO TABS
20.0000 mg | ORAL_TABLET | Freq: Every day | ORAL | Status: DC
Start: 2011-09-13 — End: 2011-09-13
  Administered 2011-09-13: 20 mg via ORAL
  Filled 2011-09-13: qty 1

## 2011-09-13 MED ORDER — DEXTROSE 5 % IV SOLN
1.0000 g | INTRAVENOUS | Status: DC
  Administered 2011-09-14 – 2011-09-15 (×2): 1 g via INTRAVENOUS
  Filled 2011-09-13 (×2): qty 10

## 2011-09-13 MED ORDER — CLONIDINE HCL 0.1 MG PO TABS
0.1000 mg | ORAL_TABLET | Freq: Every day | ORAL | Status: DC
  Administered 2011-09-14: 0.1 mg via ORAL
  Filled 2011-09-13 (×4): qty 1

## 2011-09-13 MED ORDER — FUROSEMIDE 40 MG PO TABS
40.0000 mg | ORAL_TABLET | Freq: Every day | ORAL | Status: DC
  Administered 2011-09-13 – 2011-09-16 (×4): 40 mg via ORAL
  Filled 2011-09-13 (×4): qty 1

## 2011-09-13 MED ORDER — FERROUS SULFATE 325 (65 FE) MG PO TABS
325.0000 mg | ORAL_TABLET | Freq: Every day | ORAL | Status: DC
  Administered 2011-09-13 – 2011-09-16 (×4): 325 mg via ORAL
  Filled 2011-09-13 (×6): qty 1

## 2011-09-13 MED ORDER — ONDANSETRON HCL 4 MG/2ML IJ SOLN: 4.0000 mg | Freq: Four times a day (QID) | INTRAMUSCULAR | Status: DC | PRN

## 2011-09-13 MED ORDER — SODIUM CHLORIDE 0.9 % IV SOLN: 250.0000 mL | INTRAVENOUS | Status: DC

## 2011-09-13 MED ORDER — DOCUSATE SODIUM 100 MG PO CAPS
100.0000 mg | ORAL_CAPSULE | Freq: Two times a day (BID) | ORAL | Status: DC
  Administered 2011-09-13 – 2011-09-16 (×7): 100 mg via ORAL
  Filled 2011-09-13 (×8): qty 1

## 2011-09-13 NOTE — ED Provider Notes (Signed)
History     CSN: [id number] Arrival date & time: 09/13/2011 12:44 AM   First MD Initiated Contact with Patient 09/13/11 0045      Chief Complaint  Patient presents with  . Shortness of Breath    shortness of breath x2 to 3 days. patient stated she tried to get out of the bed and she slid of the bed to floor. EMS found patient on the floor. patient c/o right leg and buttock pain    (Consider location/radiation/quality/duration/timing/severity/associated sxs/prior treatment) HPI  70 year old female presents to emergency room with chief complaint of shortness of breath.  Patient was brought in by ambulance.  When EMS arrived they found her dyspneic with crackles and rales throughout all lung fields.  They placed her on BiPAP and transported her to the emergency room after given her some nitroglycerin.  She did complain of some mild chest pain.  She's also noted to have had fever today associated with chills and nonproductive cough.  Patient has past medical history of congestive heart failure, pneumonia, and hypertension.  In the emergency department the patient is feeling better in is removed from the BiPAP and able to tolerate nonrebreather mask with 100% pulse oximetry. Past Medical History  Diagnosis Date  . Chronic systolic heart failure   . Other emphysema   . Shortness of breath   . Other and unspecified hyperlipidemia   . Essential hypertension, benign   . Coronary atherosclerosis of artery bypass graft   . Cardiomyopathy, ischemic      LVEF 35-40%.    Past Surgical History  Procedure Date  . Coronary artery bypass graft November 21, 2007    5 vessel    Family History  Problem Relation Age of Onset  . Asthma Mother   . Hypertension Mother   . Diabetes Mother   . Rheum arthritis Mother     History  Substance Use Topics  . Smoking status: Former Smoker -- 1.0 packs/day for 44 years    Types: Cigarettes    Quit date: 10/20/2007  . Smokeless tobacco: Not on file   Comment: started at age 85s.   . Alcohol Use: Not on file    OB History    Grav Para Term Preterm Abortions TAB SAB Ect Mult Living                  Review of Systems  Unable to perform ROS: Other    Allergies  Review of patient's allergies indicates no known allergies.  Home Medications   Current Outpatient Rx  Name Route Sig Dispense Refill  . ALBUTEROL SULFATE HFA 108 (90 BASE) MCG/ACT IN AERS  1-2 puffs every 4-6 hours as needed 1 Inhaler 3  . ALLOPURINOL 100 MG PO TABS Oral Take 100 mg by mouth daily.      . ASPIRIN 81 MG PO TABS Oral Take 81 mg by mouth daily.      . BUDESONIDE-FORMOTEROL FUMARATE 160-4.5 MCG/ACT IN AERO Inhalation Inhale 2 puffs into the lungs 2 (two) times daily.      [name] CARVEDILOL 6.25 MG PO TABS  Take 1 tab by mouth 2 times a day     . CETIRIZINE HCL 10 MG PO CAPS  Once a day     . COLCHICINE 0.6 MG PO TABS  1 tab two times a day as needed     . FUROSEMIDE 40 MG PO TABS  1 tab by mouth once daily     . LISINOPRIL 20 MG  PO TABS  Take 1 tab by mouth once daily     . ONE-DAILY MULTI VITAMINS PO TABS Oral Take 1 tablet by mouth daily.      [name] FISH OIL 1000 MG PO CAPS Oral Take 1 capsule by mouth 3 (three) times daily.      [name] POTASSIUM CHLORIDE 10 MEQ PO TBCR Oral Take 10 mEq by mouth daily.      [name] PROPOXYPHENE N-ACETAMINOPHEN 100-650 MG PO TABS  Take by mouth as needed     . SIMVASTATIN 40 MG PO TABS Oral Take 40 mg by mouth at bedtime.      [name] SPIRIVA HANDIHALER 18 MCG IN CAPS  ONE INHALATION IN HANDIHALER DAILY 30 each 6    There were no vitals taken for this visit.  Physical Exam  Nursing note and vitals reviewed. Constitutional: She is oriented to person, place, and time. She appears well-developed and well-nourished. No distress.  HENT:  Head: Normocephalic and atraumatic.  Eyes: Pupils are equal, round, and reactive to light.  Neck: Normal range of motion.  Cardiovascular: Normal rate and intact distal pulses.          Date: 09/13/2011   Rate: 110  Rhythm: sinus tachycardia  QRS Axis: left  Intervals: normal  ST/T Wave abnormalities: normal  Conduction Disutrbances:left anterior fascicular block:   Old EKG Reviewed: changes noted LAFB appears new     Pulmonary/Chest: Accessory muscle usage present. Tachypnea noted. She is in respiratory distress. She has wheezes. She has rhonchi. She has rales.  Abdominal: Normal appearance. She exhibits no distension.  Musculoskeletal: Normal range of motion.  Neurological: She is alert and oriented to person, place, and time. No cranial nerve deficit.  Skin: Skin is warm and dry. No rash noted.  Psychiatric: She has a normal mood and affect. Her behavior is normal.    ED Course  Procedures (including critical care time)  CRITICAL CARE Performed by: Nelva Nay L   Total critical care time: 30  Critical care time was exclusive of separately billable procedures and treating other patients.  Critical care was necessary to treat or prevent imminent or life-threatening deterioration.  Critical care was time spent personally by me on the following activities: development of treatment plan with patient and/or surrogate as well as nursing, discussions with consultants, evaluation of patient's response to treatment, examination of patient, obtaining history from patient or surrogate, ordering and performing treatments and interventions, ordering and review of laboratory studies, ordering and review of radiographic studies, pulse oximetry and re-evaluation of patient's condition.   The hospitalists were contacted and will admit the patient for further care.  Patient was given antibiotics in the emergency department.  Labs Reviewed  CBC - Abnormal; Notable for the following:    WBC 17.4 (*)    All other components within normal limits  DIFFERENTIAL - Abnormal; Notable for the following:    Neutrophils Relative 89 (*)    Neutro Abs 15.6 (*)    Lymphocytes Relative 5 (*)    All other  components within normal limits  BASIC METABOLIC PANEL - Abnormal; Notable for the following:    Glucose, Bld 207 (*)    GFR calc non Af Amer 58 (*)    GFR calc Af Amer 68 (*)    All other components within normal limits  LACTIC ACID, PLASMA - Abnormal; Notable for the following:    Lactic Acid, Venous 2.7 (*)    All other components within normal limits  PRO B  NATRIURETIC PEPTIDE - Abnormal; Notable for the following:    BNP, POC 879.2 (*)    All other components within normal limits  POCT I-STAT 3, BLOOD GAS (G3+) - Abnormal; Notable for the following:    pO2, Arterial 260.0 (*)    Acid-base deficit 3.0 (*)    All other components within normal limits  URINALYSIS, ROUTINE W REFLEX MICROSCOPIC  URINE CULTURE   Dg Chest 2 View  09/13/2011  *RADIOLOGY REPORT*  Clinical Data: Status post fall; shortness of breath.  CHEST - 2 VIEW  Comparison: Chest radiograph performed 08/12/2010  Findings: The lungs are well-aerated.  Minimal left basilar opacity likely reflects atelectasis, though pneumonia cannot be entirely excluded depending on clinical concern.  There is no evidence of pleural effusion or pneumothorax.  The heart is borderline normal in size; the patient is status post median sternotomy, with evidence of prior CABG.  No acute osseous abnormalities are seen.  IMPRESSION: Minimal left basilar opacity likely reflects atelectasis, though pneumonia cannot be entirely excluded depending on clinical concern.  No displaced rib fractures seen.  Original Report Authenticated By: Tonia Ghent, M.D.     Diagnosis 1 community-acquired pneumonia 2 emphysema 3 hypoxemia    MDM          Nelia Shi, MD 09/13/11 [phone]

## 2011-09-13 NOTE — ED Notes (Signed)
O2 changed to [place] 3 l/m

## 2011-09-13 NOTE — Progress Notes (Addendum)
PATIENT DETAILS Name: [name] Age: 70 y.o. Sex: female Date of Birth: [birth year] Admit Date: 09/13/2011 ZOX:[id number],UJWJX A, MD  Subjective: Better, less short of breath. Lying comfortably this morning.  Objective: Vital signs in last 24 hours: Filed Vitals:   09/13/11 0508 09/13/11 0515 09/13/11 0530 09/13/11 0545  BP:  104/58 117/61 105/70  Pulse:  101 100 108  Temp:    97.9 F (36.6 C)  TempSrc:    Oral  Resp:  23 16 22   Height:      Weight:    93.9 kg (207 lb 0.2 oz)  SpO2: 97% 100% 97% 97%    Weight change:   Body mass index is 34.45 kg/(m^2).  Intake/Output from previous day: No intake or output data in the 24 hours ending 09/13/11 1125  PHYSICAL EXAM: Gen Exam: Awake and alert with clear speech.   Neck: Supple, No JVD.   Chest: B/L Clear with some scattered rhonchi. CVS: S1 S2 Regular, no murmurs.  Abdomen: soft, BS +, non tender, non distended.  Extremities: no edema, lower extremities warm to touch. Neurologic: Non Focal.   Skin: No Rash.   Wounds: N/A.    CONSULTS:  none  LAB RESULTS: CBC  Lab 09/13/11 0521 09/13/11 0047  WBC 19.7* 17.4*  HGB 13.2 13.8  HCT 40.9 41.2  PLT 63* 66*  MCV 87.2 86.4  MCH 28.1 28.9  MCHC 32.3 33.5  RDW 14.1 13.9  LYMPHSABS -- 0.9  MONOABS -- 0.9  EOSABS -- 0.0  BASOSABS -- 0.0  BANDABS -- --    Chemistries   Lab 09/13/11 0521 09/13/11 0047  NA -- 139  K -- 3.6  CL -- 107  CO2 -- 19  GLUCOSE -- 207*  BUN -- 15  CREATININE 1.18* 0.95  CALCIUM -- 8.8  MG -- --    GFR Estimated Creatinine Clearance: 48.8 ml/min (by C-G formula based on Cr of 1.18).  Coagulation profile No results found for this basename: INR:5,PROTIME:5 in the last 168 hours  Cardiac Enzymes  Lab 09/13/11 0937  CKMB 3.6  TROPONINI <0.30  MYOGLOBIN --     Lab 09/13/11 0054  POCBNP 879.2*   No results found for this basename: DDIMER:2 in the last 72 hours No results found for this basename: HGBA1C:2 in the last 72  hours No results found for this basename: CHOL:2,HDL:2,LDLCALC:2,TRIG:2,CHOLHDL:2,LDLDIRECT:2 in the last 72 hours No results found for this basename: TSH,T4TOTAL,FREET3,T3FREE,THYROIDAB in the last 72 hours No results found for this basename: VITAMINB12:2,FOLATE:2,FERRITIN:2,TIBC:2,IRON:2,RETICCTPCT:2 in the last 72 hours No results found for this basename: LIPASE:2,AMYLASE:2 in the last 72 hours  Urine Studies No results found for this basename: UACOL:2,UAPR:2,USPG:2,UPH:2,UTP:2,UGL:2,UKET:2,UBIL:2,UHGB:2,UNIT:2,UROB:2,ULEU:2,UEPI:2,UWBC:2,URBC:2,UBAC:2,CAST:2,CRYS:2,UCOM:2,BILUA:2 in the last 72 hours  MICROBIOLOGY: No results found for this or any previous visit (from the past 240 hour(s)).  RADIOLOGY STUDIES/RESULTS: Dg Chest 2 View  09/13/2011  *RADIOLOGY REPORT*  Clinical Data: Status post fall; shortness of breath.  CHEST - 2 VIEW  Comparison: Chest radiograph performed 08/12/2010  Findings: The lungs are well-aerated.  Minimal left basilar opacity likely reflects atelectasis, though pneumonia cannot be entirely excluded depending on clinical concern.  There is no evidence of pleural effusion or pneumothorax.  The heart is borderline normal in size; the patient is status post median sternotomy, with evidence of prior CABG.  No acute osseous abnormalities are seen.  IMPRESSION: Minimal left basilar opacity likely reflects atelectasis, though pneumonia cannot be entirely excluded depending on clinical concern.  No displaced rib fractures seen.  Original Report Authenticated  By: JEFFREY CHANG, M.D.    MEDICATIONS: Scheduled Meds:   . albuterol  2.5 mg Nebulization Q6H  . allopurinol  100 mg Oral Daily  . aspirin  81 mg Oral Daily  . azithromycin (ZITHROMAX) 500 MG IVPB  500 mg Intravenous Once  . azithromycin  500 mg Intravenous Q24H  . budesonide-formoterol  2 puff Inhalation BID  . carvedilol  6.25 mg Oral BID WC  . cefTRIAXone  500 mg Intravenous To Major  . cefTRIAXone  (ROCEPHIN) IV  1 g Intravenous Q24H  . cloNIDine  0.1 mg Oral QHS  . cefTRIAXone (ROCEPHIN) IVPB 1 gram/50 mL D5W      . docusate sodium  100 mg Oral BID  . enoxaparin  40 mg Subcutaneous Q24H  . ferrous sulfate  325 mg Oral Q breakfast  . furosemide  40 mg Oral Daily  . lisinopril  20 mg Oral Daily  . loratadine  10 mg Oral Daily  . omega-3 acid ethyl esters  1 g Oral BID  . potassium chloride  10 mEq Oral Once  . simvastatin  40 mg Oral QHS  . sodium chloride  3 mL Intravenous Q12H  . tiotropium  18 mcg Inhalation Daily   Continuous Infusions:   . sodium chloride     PRN Meds:.morphine, ondansetron (ZOFRAN) IV, ondansetron, sodium chloride  Antibiotics: Anti-infectives     Start     Dose/Rate Route Frequency Ordered Stop   09/14/11 2200   cefTRIAXone (ROCEPHIN) 1 g in dextrose 5 % 50 mL IVPB        1 g 100 mL/hr over 30 Minutes Intravenous Every 24 hours 09/13/11 0641     09/14/11 0400   azithromycin (ZITHROMAX) 500 mg in dextrose 5 % 250 mL IVPB        500 mg 250 mL/hr over 60 Minutes Intravenous Every 24 hours 09/13/11 0641     09/13/11 0400   cefTRIAXone (ROCEPHIN) 1 g in dextrose 5 % 50 mL IVPB - Premix        500 mg 50 mL/hr over 30 Minutes Intravenous To Major Emergency Dept 09/13/11 0333 09/13/11 0423   09/13/11 0351   dextrose 5 % with cefTRIAXone (ROCEPHIN) ADS Med     Comments: Rod Can: cabinet override         09/13/11 0351 09/13/11 0353   09/13/11 0345   azithromycin (ZITHROMAX) 500 mg in dextrose 5 % 250 mL IVPB        500 mg 250 mL/hr over 60 Minutes Intravenous  Once 09/13/11 0333 09/13/11 0541          Assessment/Plan: Patient Active Hospital Problem List: PNA (pneumonia)    Assessment: Febrile with a MAXIMUM TEMPERATURE of 101.9. However patient claims to have clinical improvement and does not look toxic. Leukocytosis also increasing.   Plan: Continue with Rocephin and Zithromax, will repeat CBC and a chest x-ray tomorrow morning. We'll  continue to monitor closely and follow clinical course.   HYPERTENSION, BENIGN    Assessment: Controlled    Plan: Continue with lisinopril, clonidine, and Lasix.   CAD, ARTERY BYPASS GRAFT    Assessment: Stable. Cardiac enzymes negative so far.    Plan: Continue with aspirin, statin and beta blockers. Have asked the nursing staff to move patient to telemetry.   Chronic systolic heart failure    Assessment: Clinically compensated    Plan: Continue with Lasix, Coreg and lisinopril.  EMPHYSEMA    Assessment: Shortness of breath is significantly better.  Likely this was the etiology for SOB rather than CHF    Plan: Continue with nebulizers as ordered. Not on steroids, however would have a low threshold to add on. Clinically better, so will continue with current measures.   DYSPNEA    Assessment: Better, likely secondary to COPD. Needed BiPAP initially in the emergency room, however since discontinued.    Plan: Continue with nebulizers, antibiotics. Low threshold to add prednisone if shortness of breath worsens, however this seems to be getting better  Thrombocytopenia   Assessment: Reviewed prior labs, seems to be a chronic issue.    Plan: Monitor CBC  Disposition: Remain inpatient  DVT Prophylaxis: Prophylactic Lovenox  Code Status: Full code   Maretta Bees, MD. 09/13/2011, 11:25 AM

## 2011-09-13 NOTE — H&P (Signed)
PCP:   Dorrene German, MD   Chief Complaint: Shortness of breath and cough.  HPI: [name] is an 70 y.o. 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 She stated her symptoms started a few days ago with cough, wheezing, and shortness of breath.  She has not been able to ambulate, and when she tried to get out of bed she slipped off of her bed.  She has some mild back pain.  Her cough has been mostly nonproductive. She denied any fever or chills. She did have some substernal chest discomfort.  Evaluation in emergency room showed she has a leukocytosis with a white count of 17,000 along with a chest x-ray suggestive of pneumonia. She was started on Rocephin and Zithromax, and hospitalist was asked to admit patient for community-acquired pneumonia.  Rewiew of Systems:  The patient denies anorexia, fever, weight loss,, vision loss, decreased hearing, hoarseness, chest pain, syncope, , peripheral edema, balance deficits, hemoptysis, abdominal pain, melena, hematochezia, severe indigestion/heartburn, hematuria, incontinence, genital sores, muscle weakness, suspicious skin lesions, transient blindness, difficulty walking, depression, unusual weight change, abnormal bleeding, enlarged lymph nodes, angioedema, and breast masses   Past Medical History  Diagnosis Date  . Chronic systolic heart failure   . Other emphysema   . Shortness of breath   . Other and unspecified hyperlipidemia   . Essential hypertension, benign   . Coronary atherosclerosis of artery bypass graft   . Cardiomyopathy, ischemic      LVEF 35-40%.    Past Surgical History  Procedure Date  . Coronary artery bypass graft November 21, 2007    5 vessel    Medications:  HOME MEDS: Prior to Admission medications     Medication Sig Start Date End Date Taking? Authorizing Provider  albuterol [facility] HFA) 108 (90 BASE) MCG/ACT inhaler 1-2 puffs every 4-6 hours as needed 01/08/11  Yes Barbaraann Share, MD  allopurinol (ZYLOPRIM) 100 MG tablet Take 100 mg by mouth daily.    Yes Historical Provider, MD  aspirin 81 MG tablet Take 81 mg by mouth daily.    Yes Historical Provider, MD  budesonide-formoterol (SYMBICORT) 160-4.5 MCG/ACT inhaler Inhale 2 puffs into the lungs 2 (two) times daily.    Yes Historical Provider, MD  carvedilol (COREG) 6.25 MG tablet Take 6.25 mg by mouth 2 (two) times daily with a meal. Take 1 tab by mouth 2 times a day   Yes Historical Provider, MD  Cetirizine HCl 10 MG CAPS Take 10 mg by mouth daily as needed. For allergy symptoms   Yes Historical Provider, MD  cloNIDine (CATAPRES) 0.1 MG tablet Take 0.1 mg by mouth at bedtime.     Yes Historical Provider, MD  colchicine 0.6 MG tablet Take 0.6 mg by mouth 2 (two) times daily as needed. For gout   Yes Historical Provider, MD  ferrous sulfate 325 (65 FE) MG tablet Take 325 mg by mouth daily with breakfast.     Yes Historical Provider, MD  furosemide (LASIX) 40 MG tablet Take 40 mg by mouth daily.    Yes Historical Provider, MD  lisinopril (PRINIVIL,ZESTRIL) 20 MG tablet Take 20 mg by mouth daily.    Yes Historical Provider, MD  Multiple Vitamin (MULTIVITAMIN) tablet Take 1 tablet by mouth daily.    Yes Historical Provider, MD  Omega-3 Fatty Acids (FISH OIL) 1000  MG CAPS Take 1 capsule by mouth 3 (three) times daily.    Yes Historical Provider, MD  potassium chloride (KLOR-CON) 10 MEQ CR tablet Take 10 mEq by mouth daily.    Yes Historical Provider, MD  simvastatin (ZOCOR) 40 MG tablet Take 40 mg by mouth at bedtime.    Yes Historical Provider, MD  SPIRIVA HANDIHALER 18 MCG inhalation capsule ONE INHALATION IN HANDIHALER DAILY 02/03/11  Yes Barbaraann Share, MD     Allergies:  No Known Allergies  Social History:   reports that she quit smoking  about 3 years ago. Her smoking use included Cigarettes. She has a 44 pack-year smoking history. She does not have any smokeless tobacco history on file. She reports that she does not drink alcohol or use illicit drugs.  Family History: Family History  Problem Relation Age of Onset  . Asthma Mother   . Hypertension Mother   . Diabetes Mother   . Rheum arthritis Mother      Physical Exam: Filed Vitals:   09/13/11 0430 09/13/11 0445 09/13/11 0448 09/13/11 0508  BP: 114/71 114/65 114/65   Pulse: 102 106    Temp:      TempSrc:      Resp: 21 29 24    Height:      Weight:      SpO2: 97% 96% 97% 97%   Blood pressure 114/65, pulse 106, temperature 98.9 F (37.2 C), temperature source Oral, resp. rate 24, height 5\' 5"  (1.651 m), weight 98.884 kg (218 lb), SpO2 97.00%.  GEN:  Pleasant  person lying in the stretcher in no acute distress; cooperative with exam.  She does have slight tachypnea PSYCH:  alert and oriented x4; does not appear anxious does not appear depressed; affect is normal HEENT: Mucous membranes pink and anicteric; PERRLA; EOM intact; no cervical lymphadenopathy nor thyromegaly or carotid bruit; no JVD; Breasts:: Not examined CHEST WALL: No tenderness CHEST: Normal respiration, scattered rhonchi with inspiratory and expiratory wheezes minimally. HEART: Regular rate and rhythm; no murmurs rubs or gallops BACK: No kyphosis or scoliosis; no CVA tenderness ABDOMEN: Obese, soft non-tender; no masses, no organomegaly, normal abdominal bowel sounds; no pannus; no intertriginous candida. Rectal Exam: Not done EXTREMITIES: No bone or joint deformity; age-appropriate arthropathy of the hands and knees; no edema; no ulcerations. Genitalia: not examined PULSES: 2+ and symmetric SKIN: Normal hydration no rash or ulceration CNS: Cranial nerves 2-12 grossly intact no focal neurologic deficit   Labs & Imaging Results for orders placed during the hospital encounter of 09/13/11 (from  the past 48 hour(s))  CBC     Status: Abnormal   Collection Time   09/13/11 12:47 AM      Component Value Range Comment   WBC 17.4 (*) 4.0 - 10.5 (K/uL)    RBC 4.77  3.87 - 5.11 (MIL/uL)    Hemoglobin 13.8  12.0 - 15.0 (g/dL)    HCT 16.1  09.6 - 04.5 (%)    MCV 86.4  78.0 - 100.0 (fL)    MCH 28.9  26.0 - 34.0 (pg)    MCHC 33.5  30.0 - 36.0 (g/dL)    RDW 40.9  81.1 - 91.4 (%)    Platelets 66 (*) 150 - 400 (K/uL)   DIFFERENTIAL     Status: Abnormal   Collection Time   09/13/11 12:47 AM      Component Value Range Comment   Neutrophils Relative 89 (*) 43 - 77 (%)    Neutro Abs 15.6 (*)  1.7 - 7.7 (K/uL)    Lymphocytes Relative 5 (*) 12 - 46 (%)    Lymphs Abs 0.9  0.7 - 4.0 (K/uL)    Monocytes Relative 5  3 - 12 (%)    Monocytes Absolute 0.9  0.1 - 1.0 (K/uL)    Eosinophils Relative 0  0 - 5 (%)    Eosinophils Absolute 0.0  0.0 - 0.7 (K/uL)    Basophils Relative 0  0 - 1 (%)    Basophils Absolute 0.0  0.0 - 0.1 (K/uL)   BASIC METABOLIC PANEL     Status: Abnormal   Collection Time   09/13/11 12:47 AM      Component Value Range Comment   Sodium 139  135 - 145 (mEq/L)    Potassium 3.6  3.5 - 5.1 (mEq/L)    Chloride 107  96 - 112 (mEq/L)    CO2 19  19 - 32 (mEq/L)    Glucose, Bld 207 (*) 70 - 99 (mg/dL)    BUN 15  6 - 23 (mg/dL)    Creatinine, Ser 4.09  0.50 - 1.10 (mg/dL)    Calcium 8.8  8.4 - 10.5 (mg/dL)    GFR calc non Af Amer 58 (*) >90 (mL/min)    GFR calc Af Amer 68 (*) >90 (mL/min)   LACTIC ACID, PLASMA     Status: Abnormal   Collection Time   09/13/11 12:47 AM      Component Value Range Comment   Lactic Acid, Venous 2.7 (*) 0.5 - 2.2 (mmol/L)   PRO B NATRIURETIC PEPTIDE     Status: Abnormal   Collection Time   09/13/11 12:54 AM      Component Value Range Comment   BNP, POC 879.2 (*) 0 - 125 (pg/mL)   POCT I-STAT 3, BLOOD GAS (G3+)     Status: Abnormal   Collection Time   09/13/11  1:10 AM      Component Value Range Comment   pH, Arterial 7.381  7.350 - 7.400       pCO2 arterial 36.9  35.0 - 45.0 (mmHg)    pO2, Arterial 260.0 (*) 80.0 - 100.0 (mmHg)    Bicarbonate 21.9  20.0 - 24.0 (mEq/L)    TCO2 23  0 - 100 (mmol/L)    O2 Saturation 100.0      Acid-base deficit 3.0 (*) 0.0 - 2.0 (mmol/L)    Patient temperature 98.7 F      Collection site RADIAL, ALLEN'S TEST ACCEPTABLE      Drawn by RT      Sample type ARTERIAL      Dg Chest 2 View  09/13/2011  *RADIOLOGY REPORT*  Clinical Data: Status post fall; shortness of breath.  CHEST - 2 VIEW  Comparison: Chest radiograph performed 08/12/2010  Findings: The lungs are well-aerated.  Minimal left basilar opacity likely reflects atelectasis, though pneumonia cannot be entirely excluded depending on clinical concern.  There is no evidence of pleural effusion or pneumothorax.  The heart is borderline normal in size; the patient is status post median sternotomy, with evidence of prior CABG.  No acute osseous abnormalities are seen.  IMPRESSION: Minimal left basilar opacity likely reflects atelectasis, though pneumonia cannot be entirely excluded depending on clinical concern.  No displaced rib fractures seen.  Original Report Authenticated By: Tonia Ghent, M.D.      Assessment Present on Admission:  .HYPERTENSION, BENIGN .EMPHYSEMA .DYSPNEA .Chronic systolic heart failure .CAD, ARTERY BYPASS GRAFT .PNA (pneumonia)   PLAN:  This is a 70 year old female with history of emphysema and coronary disease, presents with leukocytosis, chest x-ray suggestive of pneumonia, cough, wheezing, and likely has COPD exacerbation with pneumonia.  She'll be admitted into telemetry and will be treated with Rocephin and Zithromax along with oxygen supplementation. Will rule out cardiac-wise with serial CPKs and troponins. She will receive frequent nebulizer treatments. I have not ordered an echocardiogram as I do not think that she is in acute heart failure.  The rest of her medications will be continued. She is stable  otherwise.   Other plans as per orders.    [name] 09/13/2011, 5:22 AM

## 2011-09-14 ENCOUNTER — Inpatient Hospital Stay (HOSPITAL_COMMUNITY): Payer: PRIVATE HEALTH INSURANCE

## 2011-09-14 LAB — CBC
HCT: 37.9 % (ref 36.0–46.0)
Hemoglobin: 12.6 g/dL (ref 12.0–15.0)
MCH: 29 pg (ref 26.0–34.0)
MCHC: 33.2 g/dL (ref 30.0–36.0)
MCV: 87.3 fL (ref 78.0–100.0)
RBC: 4.34 MIL/uL (ref 3.87–5.11)

## 2011-09-14 LAB — BASIC METABOLIC PANEL
BUN: 18 mg/dL (ref 6–23)
CO2: 24 mEq/L (ref 19–32)
GFR calc non Af Amer: 47 mL/min — ABNORMAL LOW (ref >90)
Glucose, Bld: 183 mg/dL — ABNORMAL HIGH (ref 70–99)
Potassium: 3.4 mEq/L — ABNORMAL LOW (ref 3.5–5.1)
Sodium: 138 mEq/L (ref 135–145)

## 2011-09-14 LAB — CARDIAC PANEL(CRET KIN+CKTOT+MB+TROPI): Relative Index: 0.5 (ref 0.0–2.5)

## 2011-09-14 MED ORDER — ALBUTEROL SULFATE (5 MG/ML) 0.5% IN NEBU
2.5000 mg | INHALATION_SOLUTION | Freq: Three times a day (TID) | RESPIRATORY_TRACT | Status: DC
  Administered 2011-09-14 – 2011-09-15 (×3): 2.5 mg via RESPIRATORY_TRACT
  Filled 2011-09-14 (×3): qty 0.5

## 2011-09-14 MED ORDER — ALBUTEROL SULFATE (5 MG/ML) 0.5% IN NEBU: 2.5000 mg | INHALATION_SOLUTION | RESPIRATORY_TRACT | Status: DC | PRN

## 2011-09-14 MED ORDER — POTASSIUM CHLORIDE CRYS ER 20 MEQ PO TBCR
40.0000 meq | EXTENDED_RELEASE_TABLET | Freq: Once | ORAL | Status: AC
  Administered 2011-09-14: 40 meq via ORAL
  Filled 2011-09-14: qty 2

## 2011-09-14 MED ORDER — PREDNISONE 20 MG PO TABS
40.0000 mg | ORAL_TABLET | Freq: Every day | ORAL | Status: DC
  Administered 2011-09-14 – 2011-09-16 (×3): 40 mg via ORAL
  Filled 2011-09-14 (×3): qty 2

## 2011-09-14 NOTE — Progress Notes (Signed)
09/14/2011 [name] Case Management Note [id number]       70yo female patient admitted on 09/13/11 with shortness of breath and cough. History is significant with CHF, Emphysema, HTN..... In to speak with patient at 1137am. Prior to admission, patient lived at home alone with support from grown children. Independent with ADLs. Patient reports having had home health in the past but unable to remember name. Choice of home health agencies given to patient in preparation for discharge. Awaiting physical therapy eval, but patient does report weakness with ambulating at home. CM will continue to follow for discharge needs.

## 2011-09-14 NOTE — Progress Notes (Signed)
Utilization review completed.  

## 2011-09-14 NOTE — Progress Notes (Signed)
PATIENT DETAILS Name: [name] Age: 70 y.o. Sex: female Date of Birth: Aug 26, 1941 Admit Date: 09/13/2011 JXB:[id number],AOZHY A, MD  Subjective: Overall Better, now afebrile. But claims to feel weak. Shortness of breath better, but claims to feel tight at times.  Objective: Vital signs in last 24 hours: Filed Vitals:   09/14/11 1210 09/14/11 1356 09/14/11 1359 09/14/11 1453  BP:  100/63 100/73   Pulse:  96 101   Temp:  99.7 F (37.6 C) 98.1 F (36.7 C)   TempSrc:  Oral Oral   Resp:  18 19   Height:      Weight:      SpO2: 94% 98% 97% 96%    Weight change: -1.084 kg (-2 lb 6.2 oz)  Body mass index is 35.88 kg/(m^2).  Intake/Output from previous day:  Intake/Output Summary (Last 24 hours) at 09/14/11 1556 Last data filed at 09/14/11 0837  Gross per 24 hour  Intake    973 ml  Output      1 ml  Net    972 ml    PHYSICAL EXAM: Gen Exam: Awake and alert with clear speech.   Neck: Supple, No JVD.   Chest: B/L Clear with some scattered rhonchi. CVS: S1 S2 Regular, no murmurs.  Abdomen: soft, BS +, non tender, non distended.  Extremities: no edema, lower extremities warm to touch. Neurologic: Non Focal.   Skin: No Rash.   Wounds: N/A.    CONSULTS:  none  LAB RESULTS: CBC  Lab 09/14/11 0540 09/13/11 0521 09/13/11 0047  WBC 14.8* 19.7* 17.4*  HGB 12.6 13.2 13.8  HCT 37.9 40.9 41.2  PLT 62* 63* 66*  MCV 87.3 87.2 86.4  MCH 29.0 28.1 28.9  MCHC 33.2 32.3 33.5  RDW 14.4 14.1 13.9  LYMPHSABS -- -- 0.9  MONOABS -- -- 0.9  EOSABS -- -- 0.0  BASOSABS -- -- 0.0  BANDABS -- -- --    Chemistries   Lab 09/14/11 0540 09/13/11 0521 09/13/11 0047  NA 138 -- 139  K 3.4* -- 3.6  CL 104 -- 107  CO2 24 -- 19  GLUCOSE 183* -- 207*  BUN 18 -- 15  CREATININE 1.13* 1.18* 0.95  CALCIUM 8.3* -- 8.8  MG -- -- --    GFR Estimated Creatinine Clearance: 52.1 ml/min (by C-G formula based on Cr of 1.13).  Coagulation profile No results found for this basename:  INR:5,PROTIME:5 in the last 168 hours  Cardiac Enzymes  Lab 09/14/11 0001 09/13/11 1652 09/13/11 0937  CKMB 2.8 3.4 3.6  TROPONINI <0.30 <0.30 <0.30  MYOGLOBIN -- -- --     Lab 09/14/11 0540 09/13/11 0054  POCBNP 640.5* 879.2*   No results found for this basename: DDIMER:2 in the last 72 hours No results found for this basename: HGBA1C:2 in the last 72 hours No results found for this basename: CHOL:2,HDL:2,LDLCALC:2,TRIG:2,CHOLHDL:2,LDLDIRECT:2 in the last 72 hours  Basename 09/13/11 0521  TSH 1.377  T4TOTAL --  T3FREE --  THYROIDAB --   No results found for this basename: VITAMINB12:2,FOLATE:2,FERRITIN:2,TIBC:2,IRON:2,RETICCTPCT:2 in the last 72 hours No results found for this basename: LIPASE:2,AMYLASE:2 in the last 72 hours  Urine Studies No results found for this basename: UACOL:2,UAPR:2,USPG:2,UPH:2,UTP:2,UGL:2,UKET:2,UBIL:2,UHGB:2,UNIT:2,UROB:2,ULEU:2,UEPI:2,UWBC:2,URBC:2,UBAC:2,CAST:2,CRYS:2,UCOM:2,BILUA:2 in the last 72 hours  MICROBIOLOGY: No results found for this or any previous visit (from the past 240 hour(s)).  RADIOLOGY STUDIES/RESULTS: Dg Chest 2 View  09/13/2011  *RADIOLOGY REPORT*  Clinical Data: Status post fall; shortness of breath.  CHEST - 2 VIEW  Comparison: Chest radiograph  performed 08/12/2010  Findings: The lungs are well-aerated.  Minimal left basilar opacity likely reflects atelectasis, though pneumonia cannot be entirely excluded depending on clinical concern.  There is no evidence of pleural effusion or pneumothorax.  The heart is borderline normal in size; the patient is status post median sternotomy, with evidence of prior CABG.  No acute osseous abnormalities are seen.  IMPRESSION: Minimal left basilar opacity likely reflects atelectasis, though pneumonia cannot be entirely excluded depending on clinical concern.  No displaced rib fractures seen.  Original Report Authenticated By: Tonia Ghent, M.D.    MEDICATIONS: Scheduled Meds:    .  albuterol  2.5 mg Nebulization TID  . allopurinol  100 mg Oral Daily  . aspirin  81 mg Oral Daily  . azithromycin  500 mg Intravenous Q24H  . budesonide-formoterol  2 puff Inhalation BID  . carvedilol  6.25 mg Oral BID WC  . cefTRIAXone (ROCEPHIN) IV  1 g Intravenous Q24H  . cloNIDine  0.1 mg Oral QHS  . docusate sodium  100 mg Oral BID  . enoxaparin  40 mg Subcutaneous Q24H  . ferrous sulfate  325 mg Oral Q breakfast  . furosemide  40 mg Oral Daily  . lisinopril  10 mg Oral Daily  . loratadine  10 mg Oral Daily  . omega-3 acid ethyl esters  1 g Oral BID  . potassium chloride  10 mEq Oral Once  . predniSONE  40 mg Oral Daily  . simvastatin  40 mg Oral QHS  . tiotropium  18 mcg Inhalation Daily  . DISCONTD: sodium chloride  3 mL Intravenous Q12H   Continuous Infusions:    . DISCONTD: sodium chloride     PRN Meds:.albuterol, morphine, ondansetron (ZOFRAN) IV, ondansetron, phenol, DISCONTD: sodium chloride  Antibiotics: Anti-infectives     Start     Dose/Rate Route Frequency Ordered Stop   09/14/11 2200   cefTRIAXone (ROCEPHIN) 1 g in dextrose 5 % 50 mL IVPB        1 g 100 mL/hr over 30 Minutes Intravenous Every 24 hours 09/13/11 0641     09/14/11 0400   azithromycin (ZITHROMAX) 500 mg in dextrose 5 % 250 mL IVPB        500 mg 250 mL/hr over 60 Minutes Intravenous Every 24 hours 09/13/11 0641     09/13/11 0400   cefTRIAXone (ROCEPHIN) 1 g in dextrose 5 % 50 mL IVPB - Premix        500 mg 50 mL/hr over 30 Minutes Intravenous To [facility] 09/13/11 0333 09/13/11 0423   09/13/11 0351   dextrose 5 % with cefTRIAXone (ROCEPHIN) ADS Med     Comments: Rod Can: cabinet override         09/13/11 0351 09/13/11 0353   09/13/11 0345   azithromycin (ZITHROMAX) 500 mg in dextrose 5 % 250 mL IVPB        500 mg 250 mL/hr over 60 Minutes Intravenous  Once 09/13/11 0333 09/13/11 0541          Assessment/Plan: Patient Active Hospital Problem List: PNA (pneumonia)      Assessment: Now afebrile for past 24 hours, leukocytosis also decreasing. Clinically looking better as well.   Plan: Continue with Rocephin and Zithromax,  continue to monitor closely and follow clinical course.   COPD -exacerbation    Assessment: Shortness of breath is significantly better. Likely this was the etiology for SOB rather than CHF    Plan: Continue with albuterol nebulizer what  changed to 3 times a day for another 24 hours. Begin short course of prednisone with rapid taper. Also continue with Spiriva and budesonide as ordered.   HYPERTENSION, BENIGN    Assessment: Controlled.brief period of hypotension yesterday.    Plan: Continue with lisinopril, clonidine, and Lasix. Para meters for BP medications ordered  CAD, ARTERY BYPASS GRAFT    Assessment: Stable. Cardiac enzymes negative.    Plan: Continue with aspirin, statin and beta blockers.  Chronic systolic heart failure    Assessment: Clinically compensated    Plan: Continue with Lasix, Coreg and lisinopril.  Thrombocytopenia   Assessment: Stable, Reviewed prior labs, seems to be a chronic issue.    Plan: Monitor CBC  Likely OSA Plan: Will try and arrange for outpatient sleep study.  Disposition: Remain inpatient  DVT Prophylaxis: Prophylactic Lovenox  Code Status: Full code  Maretta Bees, MD. 09/14/2011, 3:56 PM

## 2011-09-14 NOTE — Progress Notes (Signed)
09/14/2011 Fransico Michael SPARKS Case Management Note [phone]       HOME HEALTH AGENCIES SERVING [facility]   Agencies that are Medicare-Certified and are affiliated with [facility]  Telephone Number Address  [facility]   The [facility] has ownership interest in this company; however, you are under no obligation to use this agency. [phone] or  [phone] [address] [place], [place] [address]   Agencies that are Medicare-Certified and are not affiliated with [facility] Agency Telephone Number Address  [facility] [phone] Fax [phone] [address] [place], [place]  [address]  [facility] [phone] or [phone] Fax [phone] [address] [place], [place] [address]  [facility] [phone] Fax [phone] [address] [place], [place] [address]  [facility] [phone] Fax [phone] [address] [place], [place]  [address]  [facility] [phone] Fax [phone] [address], Suite [place], [place] [address]  [facility] [phone] [address] [place], [place] [address]  [facility] [phone]  [address] [place], [place] [address]  [facility] [phone] or [phone] Fax [phone] [address] [place], [place]  [address]  [facility] [phone] Fax [phone] [address] [place], [place]  [address]  [facility]  [phone] Fax [phone] [address] [place], [place] [address]               Agencies that are not Medicare-Certified and are not affiliated with [facility] Agency Telephone Number Address  [facility] [phone] or [phone] Fax [phone] [address], [place]  [address]  [facility] [phone] Fax [phone] [address] [place], [place]  [address]  [facility]  [phone] Fax [phone] [address] [place], [place] [address]  [facility] [phone] Fax [phone] [address] [place], [place] [address]  [facility] [phone] or [phone] Fax [phone] [address] [place], [place]  [address]  [facility] [phone] or [phone] Fax [phone] [address] [place], [place]  [address]  [facility] [phone] Fax [phone] [address] [place], [place]  [address]  [facility] [phone] [address] [place], [place]  [address]  [facility] [phone] Fax [phone] [address] [place], [place]  [address]  [facility] [phone] Fax [phone] [address] [place], [place]  [address]  [facility] [phone] Fax [phone] [address] [place], [place] [address]  [facility] [phone] Fax [phone] [address] [place], [place]  [address]   Choice of home health agencies  offered to patient at 1137am. Patient will let CM know choice tomorrow.

## 2011-09-14 NOTE — Progress Notes (Signed)
Physical Therapy Evaluation Patient Details Name: [name] MRN: [id number] DOB: 17-Aug-1941 Today's Date: 09/14/2011  Problem List:  Patient Active Problem List  Diagnoses  . HYPERLIPIDEMIA-MIXED  . HYPERTENSION, BENIGN  . CAD, ARTERY BYPASS GRAFT  . Chronic systolic heart failure  . EMPHYSEMA  . DYSPNEA  . PNA (pneumonia)    Past Medical History:  Past Medical History  Diagnosis Date  . Chronic systolic heart failure   . Other emphysema   . Shortness of breath   . Other and unspecified hyperlipidemia   . Essential hypertension, benign   . Coronary atherosclerosis of artery bypass graft   . Cardiomyopathy, ischemic      LVEF 35-40%.   Past Surgical History:  Past Surgical History  Procedure Date  . Coronary artery bypass graft November 21, 2007    5 vessel    PT Assessment/Plan/Recommendation PT Assessment Clinical Impression Statement: Pt presented to therapy with decreased mobility and cardiopulmonary status limiting activity.  Pt would benefit from PT in the acute setting to maximize independence and increase functional mobility to prepare for safe d/c home. PT Recommendation/Assessment: Patient will need skilled PT in the acute care venue PT Problem List: Decreased strength;Decreased activity tolerance;Decreased balance;Decreased mobility;Decreased knowledge of use of DME;Decreased safety awareness;Cardiopulmonary status limiting activity;Obesity Barriers to Discharge: None PT Therapy Diagnosis : Difficulty walking;Abnormality of gait;Generalized weakness;Acute pain PT Plan PT Frequency: Min 3X/week PT Treatment/Interventions: DME instruction;Gait training;Stair training;Functional mobility training;Therapeutic exercise;Balance training;Patient/family education PT Recommendation Recommendations for Other Services: OT consult Follow Up Recommendations: Home health PT;24 hour supervision/assistance Equipment Recommended: Defer to next venue PT Goals  Acute Rehab  PT Goals PT Goal Formulation: With patient Time For Goal Achievement: 7 days Pt will Transfer Sit to Stand/Stand to Sit: with modified independence;with upper extremity assist PT Transfer Goal: Sit to Stand/Stand to Sit - Progress: Progressing toward goal Pt will Transfer Bed to Chair/Chair to Bed: with modified independence PT Transfer Goal: Bed to Chair/Chair to Bed - Progress: Progressing toward goal Pt will Ambulate: >150 feet;with modified independence;with least restrictive assistive device PT Goal: Ambulate - Progress: Progressing toward goal Pt will Go Up / Down Stairs: 3-5 stairs;with modified independence;with least restrictive assistive device;Other (comment) (Not required for d/c, to prepare for community ambulation) PT Goal: Up/Down Stairs - Progress: Not met Additional Goals Additional Goal #1: Pt will demonstrate proper RW (or LRAD) use to improve functional mobility with improved balance. PT Goal: Additional Goal #1 - Progress: Progressing toward goal  PT Evaluation Precautions/Restrictions  Restrictions Weight Bearing Restrictions: No Prior Functioning  Home Living Lives With: Alone Receives Help From: Family;Other (Comment) (Grand-daughter lives across the street) Type of Home: Apartment Home Layout: One level Home Access: Level entry Bathroom Shower/Tub: Engineer, manufacturing systems: Standard Home Adaptive Equipment: Shower chair without back;Walker - rolling;Grab bars in shower;Straight cane Prior Function Level of Independence: Independent with transfers;Independent with gait;Needs assistance with ADLs Bath: Supervision/set-up Dressing: Minimal Able to Take Stairs?: No Driving: No Vocation: Retired Producer, television/film/video: Awake/alert Overall Cognitive Status: Appears within functional limits for tasks assessed Sensation/Coordination   Extremity Assessment RUE Assessment RUE Assessment: Within Functional Limits LUE Assessment LUE  Assessment: Within Functional Limits RLE Assessment RLE Assessment: Within Functional Limits LLE Assessment LLE Assessment: Within Functional Limits Mobility (including Balance) Bed Mobility Bed Mobility: Yes Supine to Sit: 6: Modified independent (Device/Increase time);With rails;HOB flat Transfers Transfers: Yes Sit to Stand: With upper extremity assist;From bed;Other (comment) (minguard for safety) Stand to Sit: 4: Min assist;With upper extremity  assist;With armrests;To chair/3-in-1 Stand to Sit Details: Min A for eccentric control.  Verbal cues for hand placement on armrests prior to initiating sit. Ambulation/Gait Ambulation/Gait: Yes Ambulation/Gait Assistance: 4: Min assist Ambulation/Gait Assistance Details (indicate cue type and reason): Min A to maintain balance.  Verbal cues for proper RW use and breathing technique. Ambulation Distance (Feet): 50 Feet Assistive device: Rolling walker Gait Pattern: Decreased stride length;Shuffle;Trunk flexed Gait velocity: Decreased, but not measured Stairs: No Wheelchair Mobility Wheelchair Mobility: No  Posture/Postural Control Posture/Postural Control: Postural limitations Postural Limitations: Slight forward flexed posture Balance Balance Assessed: No  Vitals: SpO2:  During ambulation with 4 L O2, SpO2 ranged from 94-97%.  During ambulation without O2, SpO2 dropped to 90-91%.  Pt reported feeling slightly dizzy, SOB, and fatigued.  4 L O2 was returned to pt and SpO2 increased to ~94%.   Pain:  Ambulation distance limited by the dizziness, fatigue, and pain.  Pt reported pain 7/10 in legs and back.   Exercise    End of Session PT - End of Session Equipment Utilized During Treatment: Gait belt;Other (comment) (4 L O2 for ambulation) Activity Tolerance: Patient limited by fatigue;Other (comment) (Pt limited by SOB, dizziness, leg and back pain) Patient left: in chair;with call bell in reach Nurse Communication: Mobility status  for transfers;Mobility status for ambulation General Behavior During Session: [facility] for tasks performed Cognition: [facility] for tasks performed  Lacinda Axon 09/14/2011, 1:48 PM  Jake Shark, PT DPT  [phone]

## 2011-09-15 ENCOUNTER — Inpatient Hospital Stay (HOSPITAL_COMMUNITY): Payer: PRIVATE HEALTH INSURANCE

## 2011-09-15 LAB — BASIC METABOLIC PANEL
CO2: 27 mEq/L (ref 19–32)
Calcium: 8.8 mg/dL (ref 8.4–10.5)
Creatinine, Ser: 1.32 mg/dL — ABNORMAL HIGH (ref 0.50–1.10)
Glucose, Bld: 138 mg/dL — ABNORMAL HIGH (ref 70–99)

## 2011-09-15 LAB — CBC
Hemoglobin: 12.4 g/dL (ref 12.0–15.0)
MCH: 28.5 pg (ref 26.0–34.0)
MCV: 88.3 fL (ref 78.0–100.0)
RBC: 4.35 MIL/uL (ref 3.87–5.11)

## 2011-09-15 MED ORDER — BUDESONIDE 0.25 MG/2ML IN SUSP
0.2500 mg | Freq: Two times a day (BID) | RESPIRATORY_TRACT | Status: DC
  Administered 2011-09-16 (×2): 0.25 mg via RESPIRATORY_TRACT
  Filled 2011-09-15 (×5): qty 2

## 2011-09-15 MED ORDER — IPRATROPIUM BROMIDE 0.02 % IN SOLN
0.5000 mg | Freq: Four times a day (QID) | RESPIRATORY_TRACT | Status: DC
  Administered 2011-09-15 – 2011-09-16 (×5): 0.5 mg via RESPIRATORY_TRACT
  Filled 2011-09-15 (×5): qty 2.5

## 2011-09-15 MED ORDER — ALBUTEROL SULFATE (5 MG/ML) 0.5% IN NEBU
2.5000 mg | INHALATION_SOLUTION | Freq: Four times a day (QID) | RESPIRATORY_TRACT | Status: DC
  Administered 2011-09-15 – 2011-09-16 (×5): 2.5 mg via RESPIRATORY_TRACT
  Filled 2011-09-15 (×5): qty 0.5

## 2011-09-15 NOTE — Progress Notes (Signed)
PATIENT DETAILS Name: [name] Age: 70 y.o. Sex: female Date of Birth: [birth year] Admit Date: 09/13/2011 ZOX:[id number],UJWJX A, MD  Subjective: Overall Better, shortness of breath better, but having problems ambulating because of shortness of breath. Claims that even at baseline she has to stop at a certain distance because of shortness of breath  Objective: Vital signs in last 24 hours: Filed Vitals:   09/15/11 1048 09/15/11 1049 09/15/11 1120 09/15/11 1359  BP:   95/62 101/67  Pulse:   86 88  Temp:   99.1 F (37.3 C) 99.4 F (37.4 C)  TempSrc:   Oral Oral  Resp:   20 18  Height:      Weight:      SpO2: 86% 93% 97% 97%    Weight change: 0.2 kg (7.1 oz)  Body mass index is 35.95 kg/(m^2).  Intake/Output from previous day:  Intake/Output Summary (Last 24 hours) at 09/15/11 1509 Last data filed at 09/15/11 1300  Gross per 24 hour  Intake    600 ml  Output      0 ml  Net    600 ml    PHYSICAL EXAM: Gen Exam: Awake and alert with clear speech.   Neck: Supple, No JVD.   Chest: B/L Clear with bilateral rhonchi. CVS: S1 S2 Regular, no murmurs.  Abdomen: soft, BS +, non tender, non distended.  Extremities: no edema, lower extremities warm to touch. Neurologic: Non Focal.   Skin: No Rash.   Wounds: N/A.    CONSULTS:  none  LAB RESULTS: CBC  Lab 09/15/11 0528 09/14/11 0540 09/13/11 0521 09/13/11 0047  WBC 13.0* 14.8* 19.7* 17.4*  HGB 12.4 12.6 13.2 13.8  HCT 38.4 37.9 40.9 41.2  PLT 65* 62* 63* 66*  MCV 88.3 87.3 87.2 86.4  MCH 28.5 29.0 28.1 28.9  MCHC 32.3 33.2 32.3 33.5  RDW 14.2 14.4 14.1 13.9  LYMPHSABS -- -- -- 0.9  MONOABS -- -- -- 0.9  EOSABS -- -- -- 0.0  BASOSABS -- -- -- 0.0  BANDABS -- -- -- --    Chemistries   Lab 09/15/11 0528 09/14/11 0540 09/13/11 0521 09/13/11 0047  NA 145 138 -- 139  K 4.1 3.4* -- 3.6  CL 109 104 -- 107  CO2 27 24 -- 19  GLUCOSE 138* 183* -- 207*  BUN 32* 18 -- 15  CREATININE 1.32* 1.13* 1.18* 0.95  CALCIUM  8.8 8.3* -- 8.8  MG 2.0 -- -- --    GFR Estimated Creatinine Clearance: 44.6 ml/min (by C-G formula based on Cr of 1.32).  Coagulation profile No results found for this basename: INR:5,PROTIME:5 in the last 168 hours  Cardiac Enzymes  Lab 09/14/11 0001 09/13/11 1652 09/13/11 0937  CKMB 2.8 3.4 3.6  TROPONINI <0.30 <0.30 <0.30  MYOGLOBIN -- -- --     Lab 09/14/11 0540 09/13/11 0054  POCBNP 640.5* 879.2*   No results found for this basename: DDIMER:2 in the last 72 hours No results found for this basename: HGBA1C:2 in the last 72 hours No results found for this basename: CHOL:2,HDL:2,LDLCALC:2,TRIG:2,CHOLHDL:2,LDLDIRECT:2 in the last 72 hours  Basename 09/13/11 0521  TSH 1.377  T4TOTAL --  T3FREE --  THYROIDAB --   No results found for this basename: VITAMINB12:2,FOLATE:2,FERRITIN:2,TIBC:2,IRON:2,RETICCTPCT:2 in the last 72 hours No results found for this basename: LIPASE:2,AMYLASE:2 in the last 72 hours  Urine Studies No results found for this basename: UACOL:2,UAPR:2,USPG:2,UPH:2,UTP:2,UGL:2,UKET:2,UBIL:2,UHGB:2,UNIT:2,UROB:2,ULEU:2,UEPI:2,UWBC:2,URBC:2,UBAC:2,CAST:2,CRYS:2,UCOM:2,BILUA:2 in the last 72 hours  MICROBIOLOGY: No results found for this or any previous  visit (from the past 240 hour(s)).  RADIOLOGY STUDIES/RESULTS: Dg Chest 2 View  09/13/2011  *RADIOLOGY REPORT*  Clinical Data: Status post fall; shortness of breath.  CHEST - 2 VIEW  Comparison: Chest radiograph performed 08/12/2010  Findings: The lungs are well-aerated.  Minimal left basilar opacity likely reflects atelectasis, though pneumonia cannot be entirely excluded depending on clinical concern.  There is no evidence of pleural effusion or pneumothorax.  The heart is borderline normal in size; the patient is status post median sternotomy, with evidence of prior CABG.  No acute osseous abnormalities are seen.  IMPRESSION: Minimal left basilar opacity likely reflects atelectasis, though pneumonia cannot be  entirely excluded depending on clinical concern.  No displaced rib fractures seen.  Original Report Authenticated By: Tonia Ghent, M.D.    MEDICATIONS: Scheduled Meds:    . albuterol  2.5 mg Nebulization Q6H  . allopurinol  100 mg Oral Daily  . aspirin  81 mg Oral Daily  . azithromycin  500 mg Intravenous Q24H  . budesonide  0.25 mg Nebulization BID  . carvedilol  6.25 mg Oral BID WC  . cefTRIAXone (ROCEPHIN) IV  1 g Intravenous Q24H  . cloNIDine  0.1 mg Oral QHS  . docusate sodium  100 mg Oral BID  . enoxaparin  40 mg Subcutaneous Q24H  . ferrous sulfate  325 mg Oral Q breakfast  . furosemide  40 mg Oral Daily  . ipratropium  0.5 mg Nebulization Q6H  . lisinopril  10 mg Oral Daily  . loratadine  10 mg Oral Daily  . omega-3 acid ethyl esters  1 g Oral BID  . potassium chloride  10 mEq Oral Once  . potassium chloride  40 mEq Oral Once  . predniSONE  40 mg Oral Daily  . simvastatin  40 mg Oral QHS  . DISCONTD: albuterol  2.5 mg Nebulization TID  . DISCONTD: budesonide-formoterol  2 puff Inhalation BID  . DISCONTD: tiotropium  18 mcg Inhalation Daily   Continuous Infusions:   PRN Meds:.albuterol, morphine, ondansetron (ZOFRAN) IV, ondansetron, phenol  Antibiotics: Anti-infectives     Start     Dose/Rate Route Frequency Ordered Stop   09/14/11 2200   cefTRIAXone (ROCEPHIN) 1 g in dextrose 5 % 50 mL IVPB        1 g 100 mL/hr over 30 Minutes Intravenous Every 24 hours 09/13/11 0641     09/14/11 0400   azithromycin (ZITHROMAX) 500 mg in dextrose 5 % 250 mL IVPB        500 mg 250 mL/hr over 60 Minutes Intravenous Every 24 hours 09/13/11 0641     09/13/11 0400   cefTRIAXone (ROCEPHIN) 1 g in dextrose 5 % 50 mL IVPB - Premix        500 mg 50 mL/hr over 30 Minutes Intravenous To [facility] 09/13/11 0333 09/13/11 0423   09/13/11 0351   dextrose 5 % with cefTRIAXone (ROCEPHIN) ADS Med     Comments: Rod Can: cabinet override         09/13/11 0351 09/13/11  0353   09/13/11 0345   azithromycin (ZITHROMAX) 500 mg in dextrose 5 % 250 mL IVPB        500 mg 250 mL/hr over 60 Minutes Intravenous  Once 09/13/11 0333 09/13/11 0541          Assessment/Plan: Patient Active Hospital Problem List: PNA (pneumonia)    Assessment: Now afebrile for past 24 hours, leukocytosis also decreasing slowly. Clinically looking better as well.  Plan: Continue with Rocephin and Zithromax,  continue to monitor closely and follow clinical course.   COPD -exacerbation    Assessment: Shortness of breath is significantly better. But still a problem, not yet back to baseline.    Plan: Change albuterol nebulizers to 4 times a day . Start Atrovent nebulizers to 4 times a day as well. Change budesonide nebulizer. Continue with incentive spirometry. Continue with oral prednisone. Will add flutter valve. Hopefully she continues to improve and can be discharged when she is close or back to her baseline. Will need oxygen therapy at home. Please note patient was supposed to be on oxygen therapy however stopped taking it a number of years ago.  HYPERTENSION, BENIGN    Assessment: Controlled.no further episodes of hypotension.    Plan: Continue with lisinopril, clonidine, and Lasix. Para meters for BP medications ordered  CAD, ARTERY BYPASS GRAFT    Assessment: Stable. Cardiac enzymes negative.    Plan: Continue with aspirin, statin and beta blockers.  Chronic systolic heart failure    Assessment: Clinically compensated.don't think her shortness of breath is related to heart failure. Her shortness of breath is secondary to COPD.    Plan: Continue with Lasix, Coreg and lisinopril.  Thrombocytopenia   Assessment: Stable, Reviewed prior labs, seems to be a chronic issue.    Plan: Monitor CBC  Likely OSA-has gained a lot of weight recently. Plan: Will try and arrange for outpatient sleep study.  Disposition: Remain inpatient, will need home health services and home O2 on  discharge. Hopefully DC home in the next 24-48 hours.  DVT Prophylaxis: Prophylactic Lovenox  Code Status: Full code  Maretta Bees, MD. 09/15/2011, 3:09 PM

## 2011-09-15 NOTE — Progress Notes (Signed)
Occupational Therapy Evaluation Patient Details Name: [name] MRN: [id number] DOB: [birth year] Today's Date: 09/15/2011   OT Assessment/Plan/Recommendation OT Recommendation Equipment Recommended: Rolling walker with 5" wheels OT Goals Acute Rehab OT Goals OT Goal Formulation: With patient Miscellaneous OT Goals Miscellaneous OT Goal #1: Pt will verbalize and demonstrate energy conservation strategies during ADL activity using AE as needed.  OT Evaluation Precautions/Restrictions  Restrictions Weight Bearing Restrictions: No      ADL ADL Eating/Feeding: Performed;Independent Where Assessed - Eating/Feeding: Edge of bed Grooming: Supervision/safety Where Assessed - Grooming: Standing at sink Upper Body Bathing: Set up Where Assessed - Upper Body Bathing: Unsupported;Sit to stand from chair Lower Body Bathing: Minimal assistance Lower Body Bathing Details (indicate cue type and reason): instrucrted in use of AE including reacher and sock aid Where Assessed - Lower Body Bathing: Sit to stand from chair Upper Body Dressing: Supervision/safety Where Assessed - Upper Body Dressing: Sitting, chair;Unsupported Where Assessed - Lower Body Dressing: Sit to stand from chair;Other (comment) (issued pt reacher and sock aid) Toilet Transfer: Minimal assistance Toilet Transfer Method: Proofreader: Regular height toilet Toileting - Clothing Manipulation: Supervision/safety Tub/Shower Transfer Method: Not assessed ADL Comments: Pt limited by fatigue. Discussed energy conversation and use of AE.  Ask MD for [facility] as well as pt may DC today Vision/Perception  Vision - History Baseline Vision: No visual deficits Cognition Cognition Arousal/Alertness: Awake/alert Overall Cognitive Status: Appears within functional limits for tasks assessed    Extremity Assessment RUE Assessment RUE Assessment: Within Functional Limits LUE Assessment LUE Assessment: Within  Functional Limits Mobility  Bed Mobility Bed Mobility: No Transfers Sit to Stand: Other (comment) (minguard for safety.  VCs for hand placement) Stand to Sit: Other (comment) (Minguard for safety.  VCs for hand placement.) Stand to Sit Details: Pt attempted to sit without hands and RW in proper position.  Pt impulsive when sitting due to fatigue and wanting to sit quickly. Exercises   End of Session OT - End of Session Activity Tolerance: Patient tolerated treatment well;Patient limited by fatigue Patient left: in chair General Behavior During Session: [facility] for tasks performed Cognition: [facility] for tasks performed   Kirt Boys 09/15/2011, 12:48 PM

## 2011-09-15 NOTE — Progress Notes (Signed)
Physical Therapy Treatment Patient Details Name: [name] MRN: [id number] DOB: 05/15/1941 Today's Date: 09/15/2011  PT Assessment/Plan  PT - Assessment/Plan Comments on Treatment Session: Pt continues to be limited by SOB and fatigue.  Pts O2 was measured throughout treatment session.  Pre-exercise 97% on 3L O2 and 93% on RA.  During amulation pts O2 decrased to 86% on RA and increased to 90-92% when returned 3L O2.  Pt very pleasant and willing to work however concerned about going home without O2.  CM aware that pt may need RW, HHPT and home O2. PT Plan: Discharge plan remains appropriate PT Frequency: Min 3X/week Follow Up Recommendations: Home health PT;24 hour supervision/assistance Equipment Recommended: Rolling walker with 5" wheels PT Goals  Acute Rehab PT Goals PT Goal Formulation: With patient Time For Goal Achievement: 7 days Pt will Transfer Sit to Stand/Stand to Sit: with modified independence;with upper extremity assist PT Transfer Goal: Sit to Stand/Stand to Sit - Progress: Progressing toward goal Pt will Transfer Bed to Chair/Chair to Bed: with modified independence PT Transfer Goal: Bed to Chair/Chair to Bed - Progress: Progressing toward goal Pt will Ambulate: >150 feet;with modified independence;with least restrictive assistive device PT Goal: Ambulate - Progress: Progressing toward goal Pt will Go Up / Down Stairs: 3-5 stairs;with modified independence;with least restrictive assistive device;Other (comment) (Not assessed) PT Goal: Up/Down Stairs - Progress: Other (comment) (Not assessed) Additional Goals Additional Goal #1: Pt will demonstrate proper RW (or LRAD) use to improve functional mobility with improved balance. PT Goal: Additional Goal #1 - Progress: Progressing toward goal  PT Treatment Precautions/Restrictions  Restrictions Weight Bearing Restrictions: No Mobility (including Balance) Bed Mobility Bed Mobility: No Transfers Transfers: Yes Sit to  Stand: Other (comment) (minguard for safety.  VCs for hand placement) Stand to Sit: Other (comment) (Minguard for safety.  VCs for hand placement.) Stand to Sit Details: Pt attempted to sit without hands and RW in proper position.  Pt impulsive when sitting due to fatigue and wanting to sit quickly. Ambulation/Gait Ambulation/Gait: Yes Ambulation/Gait Assistance: Other (comment) (minguard for safety.  VCs for RW placement.) Ambulation Distance (Feet): 50 Feet Assistive device: Rolling walker Gait Pattern: Decreased stride length;Shuffle;Trunk flexed Gait velocity: Decreased, but not measured Stairs: No Wheelchair Mobility Wheelchair Mobility: No  Posture/Postural Control Posture/Postural Control: Postural limitations Postural Limitations: Slight forward flexed posture Balance Balance Assessed: No Exercise    End of Session PT - End of Session Equipment Utilized During Treatment: Gait belt;Other (comment) Activity Tolerance: Patient limited by fatigue;Other (comment) Patient left: in chair;with call bell in reach Nurse Communication: Mobility status for transfers;Mobility status for ambulation General Behavior During Session: [facility] for tasks performed Cognition: [facility] for tasks performed  Sargon Scouten 09/15/2011, 11:04 AM

## 2011-09-16 DIAGNOSIS — R0902 Hypoxemia: Secondary | ICD-10-CM

## 2011-09-16 DIAGNOSIS — E669 Obesity, unspecified: Secondary | ICD-10-CM

## 2011-09-16 DIAGNOSIS — J4489 Other specified chronic obstructive pulmonary disease: Secondary | ICD-10-CM

## 2011-09-16 DIAGNOSIS — J449 Chronic obstructive pulmonary disease, unspecified: Secondary | ICD-10-CM

## 2011-09-16 LAB — BASIC METABOLIC PANEL
BUN: 30 mg/dL — ABNORMAL HIGH (ref 6–23)
Chloride: 108 mEq/L (ref 96–112)
Creatinine, Ser: 1.19 mg/dL — ABNORMAL HIGH (ref 0.50–1.10)
GFR calc Af Amer: 52 mL/min — ABNORMAL LOW (ref >90)

## 2011-09-16 LAB — CBC
HCT: 40 % (ref 36.0–46.0)
MCHC: 32.3 g/dL (ref 30.0–36.0)
MCV: 88.1 fL (ref 78.0–100.0)
RDW: 14.3 % (ref 11.5–15.5)
WBC: 13.4 10*3/uL — ABNORMAL HIGH (ref 4.0–10.5)

## 2011-09-16 MED ORDER — IPRATROPIUM BROMIDE 0.02 % IN SOLN: 0.5000 mg | Freq: Four times a day (QID) | RESPIRATORY_TRACT | Status: DC | PRN

## 2011-09-16 MED ORDER — MOXIFLOXACIN HCL 400 MG PO TABS
400.0000 mg | ORAL_TABLET | Freq: Every day | ORAL | Status: DC
  Administered 2011-09-16: 400 mg via ORAL
  Filled 2011-09-16: qty 1

## 2011-09-16 MED ORDER — ALBUTEROL SULFATE (5 MG/ML) 0.5% IN NEBU: 2.5000 mg | INHALATION_SOLUTION | RESPIRATORY_TRACT | Status: DC | PRN

## 2011-09-16 MED ORDER — PREDNISONE 10 MG PO TABS: ORAL_TABLET | ORAL | Status: DC

## 2011-09-16 MED ORDER — MOXIFLOXACIN HCL 400 MG PO TABS: 400.0000 mg | ORAL_TABLET | Freq: Every day | ORAL | Status: AC

## 2011-09-16 NOTE — Progress Notes (Signed)
PATIENT DETAILS Name: [name] Age: 70 y.o. Sex: female Date of Birth: 07-Apr-1941 Admit Date: 09/13/2011 ONG:[id number],LKGMW A, MD  Subjective: Overall Better, shortness of breath better, but having problems ambulating because of shortness of breath. Low grade fever overnight.  Objective: Vital signs in last 24 hours: Filed Vitals:   09/16/11 0215 09/16/11 0509 09/16/11 0952 09/16/11 1400  BP:  120/73  116/78  Pulse:  81  86  Temp:  100.2 F (37.9 C)  99.2 F (37.3 C)  TempSrc:    Oral  Resp:  20  22  Height:      Weight:  98 kg (216 lb 0.8 oz)    SpO2: 100% 97% 96% 96%    Weight change: 0 kg (0 lb)  Body mass index is 35.95 kg/(m^2).  Intake/Output from previous day:  Intake/Output Summary (Last 24 hours) at 09/16/11 1623 Last data filed at 09/16/11 1300  Gross per 24 hour  Intake    600 ml  Output      0 ml  Net    600 ml    PHYSICAL EXAM: Gen Exam: Awake and alert with clear speech.   Neck: Supple, No JVD.   Chest: B/L Clear with scattered bilateral rhonchi. CVS: S1 S2 Regular, no murmurs.  Abdomen: soft, BS +, non tender, non distended.  Extremities: no edema, lower extremities warm to touch. Neurologic: Non Focal.   Skin: No Rash.   Wounds: N/A.    CONSULTS:  none  LAB RESULTS: CBC  Lab 09/16/11 0530 09/15/11 0528 09/14/11 0540 09/13/11 0521 09/13/11 0047  WBC 13.4* 13.0* 14.8* 19.7* 17.4*  HGB 12.9 12.4 12.6 13.2 13.8  HCT 40.0 38.4 37.9 40.9 41.2  PLT 75* 65* 62* 63* 66*  MCV 88.1 88.3 87.3 87.2 86.4  MCH 28.4 28.5 29.0 28.1 28.9  MCHC 32.3 32.3 33.2 32.3 33.5  RDW 14.3 14.2 14.4 14.1 13.9  LYMPHSABS -- -- -- -- 0.9  MONOABS -- -- -- -- 0.9  EOSABS -- -- -- -- 0.0  BASOSABS -- -- -- -- 0.0  BANDABS -- -- -- -- --    Chemistries   Lab 09/16/11 0530 09/15/11 0528 09/14/11 0540 09/13/11 0521 09/13/11 0047  NA 144 145 138 -- 139  K 4.5 4.1 3.4* -- 3.6  CL 108 109 104 -- 107  CO2 25 27 24  -- 19  GLUCOSE 106* 138* 183* -- 207*  BUN  30* 32* 18 -- 15  CREATININE 1.19* 1.32* 1.13* 1.18* 0.95  CALCIUM 8.3* 8.8 8.3* -- 8.8  MG -- 2.0 -- -- --    GFR Estimated Creatinine Clearance: 49.5 ml/min (by C-G formula based on Cr of 1.19).  Coagulation profile No results found for this basename: INR:5,PROTIME:5 in the last 168 hours  Cardiac Enzymes  Lab 09/14/11 0001 09/13/11 1652 09/13/11 0937  CKMB 2.8 3.4 3.6  TROPONINI <0.30 <0.30 <0.30  MYOGLOBIN -- -- --     Lab 09/16/11 0530 09/14/11 0540 09/13/11 0054  POCBNP 1112.0* 640.5* 879.2*   No results found for this basename: DDIMER:2 in the last 72 hours No results found for this basename: HGBA1C:2 in the last 72 hours No results found for this basename: CHOL:2,HDL:2,LDLCALC:2,TRIG:2,CHOLHDL:2,LDLDIRECT:2 in the last 72 hours No results found for this basename: TSH,T4TOTAL,FREET3,T3FREE,THYROIDAB in the last 72 hours No results found for this basename: VITAMINB12:2,FOLATE:2,FERRITIN:2,TIBC:2,IRON:2,RETICCTPCT:2 in the last 72 hours No results found for this basename: LIPASE:2,AMYLASE:2 in the last 72 hours  Urine Studies No results found for this basename: UACOL:2,UAPR:2,USPG:2,UPH:2,UTP:2,UGL:2,UKET:2,UBIL:2,UHGB:2,UNIT:2,UROB:2,ULEU:2,UEPI:2,UWBC:2,URBC:2,UBAC:2,CAST:2,CRYS:2,UCOM:2,BILUA:2  in the last 72 hours  MICROBIOLOGY: No results found for this or any previous visit (from the past 240 hour(s)).  RADIOLOGY STUDIES/RESULTS: Dg Chest 2 View  09/13/2011  *RADIOLOGY REPORT*  Clinical Data: Status post fall; shortness of breath.  CHEST - 2 VIEW  Comparison: Chest radiograph performed 08/12/2010  Findings: The lungs are well-aerated.  Minimal left basilar opacity likely reflects atelectasis, though pneumonia cannot be entirely excluded depending on clinical concern.  There is no evidence of pleural effusion or pneumothorax.  The heart is borderline normal in size; the patient is status post median sternotomy, with evidence of prior CABG.  No acute osseous  abnormalities are seen.  IMPRESSION: Minimal left basilar opacity likely reflects atelectasis, though pneumonia cannot be entirely excluded depending on clinical concern.  No displaced rib fractures seen.  Original Report Authenticated By: Tonia Ghent, M.D.    MEDICATIONS: Scheduled Meds:    . albuterol  2.5 mg Nebulization Q6H  . allopurinol  100 mg Oral Daily  . aspirin  81 mg Oral Daily  . budesonide  0.25 mg Nebulization BID  . carvedilol  6.25 mg Oral BID WC  . cloNIDine  0.1 mg Oral QHS  . docusate sodium  100 mg Oral BID  . enoxaparin  40 mg Subcutaneous Q24H  . ferrous sulfate  325 mg Oral Q breakfast  . furosemide  40 mg Oral Daily  . ipratropium  0.5 mg Nebulization Q6H  . lisinopril  10 mg Oral Daily  . loratadine  10 mg Oral Daily  . moxifloxacin  400 mg Oral q1800  . omega-3 acid ethyl esters  1 g Oral BID  . potassium chloride  10 mEq Oral Once  . predniSONE  40 mg Oral Daily  . simvastatin  40 mg Oral QHS  . DISCONTD: azithromycin  500 mg Intravenous Q24H  . DISCONTD: cefTRIAXone (ROCEPHIN)  IV  1 g Intravenous Q24H   Continuous Infusions:   PRN Meds:.albuterol, morphine, ondansetron (ZOFRAN) IV, ondansetron, phenol  Antibiotics: Anti-infectives     Start     Dose/Rate Route Frequency Ordered Stop   09/16/11 1800   moxifloxacin (AVELOX) tablet 400 mg        400 mg Oral Daily-1800 09/16/11 1132     09/14/11 2200   cefTRIAXone (ROCEPHIN) 1 g in dextrose 5 % 50 mL IVPB  Status:  Discontinued        1 g 100 mL/hr over 30 Minutes Intravenous Every 24 hours 09/13/11 0641 09/16/11 1132   09/14/11 0400   azithromycin (ZITHROMAX) 500 mg in dextrose 5 % 250 mL IVPB  Status:  Discontinued        500 mg 250 mL/hr over 60 Minutes Intravenous Every 24 hours 09/13/11 0641 09/16/11 1132   09/13/11 0400   cefTRIAXone (ROCEPHIN) 1 g in dextrose 5 % 50 mL IVPB - Premix        500 mg 50 mL/hr over 30 Minutes Intravenous To [facility] Dept 09/13/11 0333 09/13/11 0423    09/13/11 0351   dextrose 5 % with cefTRIAXone (ROCEPHIN) ADS Med     Comments: KULLA, LEONARD: cabinet override         09/13/11 0351 09/13/11 0353   09/13/11 0345   azithromycin (ZITHROMAX) 500 mg in dextrose 5 % 250 mL IVPB        500 mg 250 mL/hr over 60 Minutes Intravenous  Once 09/13/11 0333 09/13/11 0541          Assessment/Plan: Patient Active  Hospital Problem List: PNA (pneumonia)    Assessment: Now afebrile for past 24 hours, leukocytosis also decreasing slowly. Clinically looking better as well.   Plan:D/C Rocephin and Zithromax, change to avelox. Low grade fever-but not toxic. Leukocytosis  Likely from steroids.  COPD -exacerbation    Assessment: Shortness of breath is significantly better. But still a problem, not yet back to baseline. Still trouble ambulating,lives alone.   Plan: Continue with nebs, steroids, avelox. On Incentive Spiromety and Flutter valve as well. Will get PCCM to see, if they have further recommendations. Needs to loose weight!  HYPERTENSION, BENIGN    Assessment: Controlled.no further episodes of hypotension.    Plan: Continue with lisinopril, clonidine, and Lasix. Parameters for BP medications ordered  CAD, ARTERY BYPASS GRAFT    Assessment: Stable. Cardiac enzymes negative.    Plan: Continue with aspirin, statin and beta blockers.  Chronic systolic heart failure    Assessment: Clinically compensated.don't think her shortness of breath is related to heart failure. Her shortness of breath is secondary to COPD.    Plan: Continue with Lasix, Coreg and lisinopril.  Thrombocytopenia   Assessment: Stable, Reviewed prior labs, seems to be a chronic issue.    Plan: Monitor CBC  Likely OSA-has gained a lot of weight recently. Plan: Will try and arrange for outpatient sleep study.  Disposition: Remain inpatient, will need home health services and home O2 on discharge. Hopefully DC home in the next 24-48 hours.  DVT Prophylaxis: Prophylactic  Lovenox  Code Status: Full code  Maretta Bees, MD. 09/16/2011, 4:23 PM

## 2011-09-16 NOTE — Consult Note (Signed)
Pulmonology Consult Consulting MD: Jeoffrey Massed  Chief Complaint: Shortness of breath and cough.   HPI:  [name] is an 70 y.o. female with history of prior tobacco use, emphysema, coronary disease status post CABG, hypertension, morbid obesity, ischemic cardiomyopathy with ejection fraction 35-40%, presents to [facility] emergency room via ambulance complaining of shortness of breath. She stated her symptoms started one week ago with cough, wheezing, and shortness of breath. She has not been able to ambulate, and when she tried to get out of bed she slipped off of her bed. She has some mild back pain. Her cough has been mostly nonproductive. She denied any fever or chills. She did have some substernal chest discomfort. Evaluation in emergency room showed she has a leukocytosis with a white count of 17,000 along with a chest x-ray suggestive of pneumonia. She was started on Rocephin and Zithromax, and the triad hospitalist asked PCCM to evaluate. Patient is evidently supposed to be on O2 at home but has stopped using that years ago.  Patient of Dr. Shelle Iron at [facility] office.   Rewiew of Systems:  The patient denies anorexia, fever, weight loss,, vision loss, decreased hearing, hoarseness, chest pain, syncope, , peripheral edema, balance deficits, hemoptysis, abdominal pain, melena, hematochezia, severe indigestion/heartburn, hematuria, incontinence, genital sores, muscle weakness, suspicious skin lesions, transient blindness, difficulty walking, depression, unusual weight change, abnormal bleeding, enlarged lymph nodes, angioedema, and breast masses   Past Medical History   Diagnosis  Date   .  Chronic systolic heart failure    .  Other emphysema    .  Shortness of breath    .  Other and unspecified hyperlipidemia    .  Essential hypertension, benign    .  Coronary atherosclerosis of artery bypass graft    .  Cardiomyopathy, ischemic      LVEF 35-40%.    Past Surgical History     Procedure  Date   .  Coronary artery bypass graft  November 21, 2007     5 vessel    Medications:   Prior to Admission medications   Medication  Sig  Start Date  End Date  Taking?  Authorizing Provider   albuterol [facility] HFA) 108 (90 BASE) MCG/ACT inhaler  1-2 puffs every 4-6 hours as needed  01/08/11   Yes  Barbaraann Share, MD   allopurinol (ZYLOPRIM) 100 MG tablet  Take 100 mg by mouth daily.    Yes  Historical Provider, MD   aspirin 81 MG tablet  Take 81 mg by mouth daily.    Yes  Historical Provider, MD   budesonide-formoterol (SYMBICORT) 160-4.5 MCG/ACT inhaler  Inhale 2 puffs into the lungs 2 (two) times daily.    Yes  Historical Provider, MD   carvedilol (COREG) 6.25 MG tablet  Take 6.25 mg by mouth 2 (two) times daily with a meal. Take 1 tab by mouth 2 times a day    Yes  Historical Provider, MD   Cetirizine HCl 10 MG CAPS  Take 10 mg by mouth daily as needed. For allergy symptoms    Yes  Historical Provider, MD   cloNIDine (CATAPRES) 0.1 MG tablet  Take 0.1 mg by mouth at bedtime.    Yes  Historical Provider, MD   colchicine 0.6 MG tablet  Take 0.6 mg by mouth 2 (two) times daily as needed. For gout    Yes  Historical Provider, MD   ferrous sulfate 325 (65 FE) MG tablet  Take 325  mg by mouth daily with breakfast.    Yes  Historical Provider, MD   furosemide (LASIX) 40 MG tablet  Take 40 mg by mouth daily.    Yes  Historical Provider, MD   lisinopril (PRINIVIL,ZESTRIL) 20 MG tablet  Take 20 mg by mouth daily.    Yes  Historical Provider, MD   Multiple Vitamin (MULTIVITAMIN) tablet  Take 1 tablet by mouth daily.    Yes  Historical Provider, MD   Omega-3 Fatty Acids (FISH OIL) 1000 MG CAPS  Take 1 capsule by mouth 3 (three) times daily.    Yes  Historical Provider, MD   potassium chloride (KLOR-CON) 10 MEQ CR tablet  Take 10 mEq by mouth daily.    Yes  Historical Provider, MD   simvastatin (ZOCOR) 40 MG tablet  Take 40 mg by mouth at bedtime.    Yes  Historical Provider, MD   SPIRIVA  HANDIHALER 18 MCG inhalation capsule  ONE INHALATION IN HANDIHALER DAILY  02/03/11   Yes  Barbaraann Share, MD    Allergies:  No Known Allergies   Social History:  reports that she quit smoking about 3 years ago. Her smoking use included Cigarettes. She has a 44 pack-year smoking history. She does not have any smokeless tobacco history on file. She reports that she does not drink alcohol or use illicit drugs.   Family History:  Family History   Problem  Relation  Age of Onset   .  Asthma  Mother    .  Hypertension  Mother    .  Diabetes  Mother    .  Rheum arthritis  Mother     Physical Exam: Filed Vitals:   09/16/11 1400  BP: 116/78  Pulse: 86  Temp: 99.2 F (37.3 C)  Resp: 22   General: Well appearing, NAD on O2. Neuro: Alert and oriented, moving all extremities. Heart: RRR, Nl S1/S2, -M/R/G. Lung: Diffuse end exp wheezes. Abdomen: Soft, NT, ND and +BS. Ext: -edema and -tenderness. Labs: CBC    Component Value Date/Time   WBC 13.4* 09/16/2011 0530   RBC 4.54 09/16/2011 0530   HGB 12.9 09/16/2011 0530   HCT 40.0 09/16/2011 0530   PLT 75* 09/16/2011 0530   MCV 88.1 09/16/2011 0530   MCH 28.4 09/16/2011 0530   MCHC 32.3 09/16/2011 0530   RDW 14.3 09/16/2011 0530   LYMPHSABS 0.9 09/13/2011 0047   MONOABS 0.9 09/13/2011 0047   EOSABS 0.0 09/13/2011 0047   BASOSABS 0.0 09/13/2011 0047   BMET    Component Value Date/Time   NA 144 09/16/2011 0530   K 4.5 09/16/2011 0530   CL 108 09/16/2011 0530   CO2 25 09/16/2011 0530   GLUCOSE 106* 09/16/2011 0530   BUN 30* 09/16/2011 0530   CREATININE 1.19* 09/16/2011 0530   CALCIUM 8.3* 09/16/2011 0530   GFRNONAA 44* 09/16/2011 0530   GFRAA 52* 09/16/2011 0530   chest X-ray IMPRESSION:  Borderline heart size. Prior CABG. Bibasilar atelectasis.  A/P: 70 year old with history of COPD, presents with COPD exacerbation on Rocephin and zithromax.  Patient was admitted by triad hospitalist.  PCCM asked to evaluate for COPD and  add recommendations for treatment.  The patient evidently was supposed to be on O2 but has not been wearing it for years.  Recs: 1. Continue Rocephin and Zithromax, after day 5 would recommend change to PO avelox and if still no WBC or fever after 24 hours then ok to D/C with 8  days total of abx. 2.  Agree with PO prednisone, continue current dose for total of 8 days then may D/C. 3. Agree with change from symbicort to neb budesonide until of abx and steroids then can change back to symbicort. 4. Will need an ambulatory desat to qualify for home O2 and advised to use to avoid heart failure. 5. IS and flutter valve. 6. PT/OT. 7. F/U with PCCM as outpatient upon d/c. 8. 2D echo and BNP, need to investigate CHF as there are some pulmonary edema on CXR, may need some diureses if not improved. 9. PCCM will follow in AM.  Koren Bound, M.D.

## 2011-09-16 NOTE — Progress Notes (Signed)
Room Air sat 91%.Marland Kitchenamb in hall and sat down to 87% and applied 02 and sat up to 94% on 2 liters. Sob when walking on room air.

## 2011-09-16 NOTE — Discharge Summary (Signed)
PATIENT DETAILS Name: [name] Age: 70 y.o. Sex: female Date of Birth: 1941/06/12 MRN: [id number]. Admit Date: 09/13/2011 Admitting Physician: No admitting provider for patient encounter. FAO:ZHYQMVH,QIONG A, MD  PRIMARY DISCHARGE DIAGNOSIS:  Principal Problem:  *PNA (pneumonia)  COPD exacerbation  Acute on chronic hypoxic respiratory failure-being discharged on home oxygen.  Active Problems:  HYPERTENSION, BENIGN  CAD, ARTERY BYPASS GRAFT  Chronic systolic heart failure  EMPHYSEMA  DYSPNEA      PAST MEDICAL HISTORY: Past Medical History  Diagnosis Date  . Chronic systolic heart failure   . Other emphysema   . Shortness of breath   . Other and unspecified hyperlipidemia   . Essential hypertension, benign   . Coronary atherosclerosis of artery bypass graft   . Cardiomyopathy, ischemic      LVEF 35-40%.    DISCHARGE MEDICATIONS: Current Discharge Medication List    START taking these medications   Details  albuterol (PROVENTIL) (5 MG/ML) 0.5% nebulizer solution Take 0.5 mLs (2.5 mg total) by nebulization every 2 (two) hours as needed for wheezing or shortness of breath. Qty: 20 mL, Refills: 0    ipratropium (ATROVENT) 0.02 % nebulizer solution Take 2.5 mLs (0.5 mg total) by nebulization 4 (four) times daily as needed for wheezing. Qty: 75 mL, Refills: 0    moxifloxacin (AVELOX) 400 MG tablet Take 1 tablet (400 mg total) by mouth daily at 6 PM. Qty: 4 tablet, Refills: 0    predniSONE (DELTASONE) 10 MG tablet Take 4 tablets daily from 09/17/11 to 12/212 Take 3 tablets daily from 09/21/11 to 09/24/11 Take 2 tablets daily from 09/25/11 to 09/28/11 Take 1 tablet daily from 09/29/11 to 10/02/11 and then discontinue Qty: 40 tablet, Refills: 0      CONTINUE these medications which have NOT CHANGED   Details  albuterol (PROAIR HFA) 108 (90 BASE) MCG/ACT inhaler 1-2 puffs every 4-6 hours as needed Qty: 1 Inhaler, Refills: 3    allopurinol (ZYLOPRIM) 100 MG tablet Take  100 mg by mouth daily.     aspirin 81 MG tablet Take 81 mg by mouth daily.     budesonide-formoterol (SYMBICORT) 160-4.5 MCG/ACT inhaler Inhale 2 puffs into the lungs 2 (two) times daily.     carvedilol (COREG) 6.25 MG tablet Take 6.25 mg by mouth 2 (two) times daily with a meal. Take 1 tab by mouth 2 times a day    Cetirizine HCl 10 MG CAPS Take 10 mg by mouth daily as needed. For allergy symptoms    cloNIDine (CATAPRES) 0.1 MG tablet Take 0.1 mg by mouth at bedtime.      colchicine 0.6 MG tablet Take 0.6 mg by mouth 2 (two) times daily as needed. For gout    ferrous sulfate 325 (65 FE) MG tablet Take 325 mg by mouth daily with breakfast.      furosemide (LASIX) 40 MG tablet Take 40 mg by mouth daily.     lisinopril (PRINIVIL,ZESTRIL) 20 MG tablet Take 20 mg by mouth daily.     Multiple Vitamin (MULTIVITAMIN) tablet Take 1 tablet by mouth daily.     Omega-3 Fatty Acids (FISH OIL) 1000 MG CAPS Take 1 capsule by mouth 3 (three) times daily.     potassium chloride (KLOR-CON) 10 MEQ CR tablet Take 10 mEq by mouth daily.     simvastatin (ZOCOR) 40 MG tablet Take 40 mg by mouth at bedtime.     SPIRIVA HANDIHALER 18 MCG inhalation capsule ONE INHALATION IN HANDIHALER DAILY Qty: 30 each, Refills:  6         BRIEF HPI:  See H&P, Labs, Consult and Test reports for all details in brief, patient was admitted for cough, fever and shortness of breath. In the ED patient a transient and require BiPAP support. She was then admitted to the hospitalist service for further management. For further details please see the history and physical that was done on admission.  CONSULTATIONS:   pulmonary/intensive care  PERTINENT RADIOLOGIC STUDIES: Dg Chest 2 View  09/13/2011  *RADIOLOGY REPORT*  Clinical Data: Status post fall; shortness of breath.  CHEST - 2 VIEW  Comparison: Chest radiograph performed 08/12/2010  Findings: The lungs are well-aerated.  Minimal left basilar opacity likely reflects  atelectasis, though pneumonia cannot be entirely excluded depending on clinical concern.  There is no evidence of pleural effusion or pneumothorax.  The heart is borderline normal in size; the patient is status post median sternotomy, with evidence of prior CABG.  No acute osseous abnormalities are seen.  IMPRESSION: Minimal left basilar opacity likely reflects atelectasis, though pneumonia cannot be entirely excluded depending on clinical concern.  No displaced rib fractures seen.  Original Report Authenticated By: Tonia Ghent, M.D.   Dg Chest Port 1 View  09/15/2011  *RADIOLOGY REPORT*  Clinical Data: Shortness of breath, cough.  PORTABLE CHEST - 1 VIEW  Comparison: 09/14/2011  Findings: Prior CABG.  Heart is borderline in size.  Bibasilar opacities, likely atelectasis.  No effusions or acute bony abnormality.  IMPRESSION: Borderline heart size.  Prior CABG.  Bibasilar atelectasis.  Original Report Authenticated By: Cyndie Chime, M.D.   Dg Chest Port 1 View  09/14/2011  *RADIOLOGY REPORT*  Clinical Data: Short of breath.  PORTABLE CHEST - 1 VIEW  Comparison: 05/13/2011.  Findings: CABG.  Cardiopericardial silhouette appears within normal limits.  Low lung volumes are present with bilateral basilar atelectasis.  No focal consolidation is identified.  No effusion.  IMPRESSION: Low volume chest and postoperative changes of CABG.  Original Report Authenticated By: Andreas Newport, M.D.     PERTINENT LAB RESULTS: CBC:  Basename 09/16/11 0530 09/15/11 0528  WBC 13.4* 13.0*  HGB 12.9 12.4  HCT 40.0 38.4  PLT 75* 65*   CMET CMP     Component Value Date/Time   NA 144 09/16/2011 0530   K 4.5 09/16/2011 0530   CL 108 09/16/2011 0530   CO2 25 09/16/2011 0530   GLUCOSE 106* 09/16/2011 0530   BUN 30* 09/16/2011 0530   CREATININE 1.19* 09/16/2011 0530   CALCIUM 8.3* 09/16/2011 0530   PROT 6.9 02/28/2010 0944   ALBUMIN 3.4* 02/28/2010 0944   AST 16 02/28/2010 0944   ALT 14 02/28/2010 0944    ALKPHOS 89 02/28/2010 0944   BILITOT 0.7 02/28/2010 0944   GFRNONAA 44* 09/16/2011 0530   GFRAA 52* 09/16/2011 0530    GFR Estimated Creatinine Clearance: 49.5 ml/min (by C-G formula based on Cr of 1.19). No results found for this basename: LIPASE:2,AMYLASE:2 in the last 72 hours  Basename 09/14/11 0001  CKTOTAL 559*  CKMB 2.8  CKMBINDEX --  TROPONINI <0.30    Basename 09/16/11 0530 09/14/11 0540  POCBNP 1112.0* 640.5*   No results found for this basename: DDIMER:2 in the last 72 hours No results found for this basename: HGBA1C:2 in the last 72 hours No results found for this basename: CHOL:2,HDL:2,LDLCALC:2,TRIG:2,CHOLHDL:2,LDLDIRECT:2 in the last 72 hours No results found for this basename: TSH,T4TOTAL,FREET3,T3FREE,THYROIDAB in the last 72 hours No results found for this basename: VITAMINB12:2,FOLATE:2,FERRITIN:2,TIBC:2,IRON:2,RETICCTPCT:2 in  the last 72 hours Coags: No results found for this basename: PT:2,INR:2 in the last 72 hours Microbiology: No results found for this or any previous visit (from the past 240 hour(s)).   BRIEF HOSPITAL COURSE:   Principal Problem:  *PNA (pneumonia) -Patient continues to improve from this point of view. She says her cough has gotten better, but is still dry. Her leukocytosis has almost resolved, however she is on steroids. She intermittently continues to have some low-grade fever. But clinically she is significantly better and not toxic looking. She was been transitioned over to Avelox today, which she will continue for the next few days. -She requests she be discharged today, in fact she is already dressed up and wanted to go home.  COPD exacerbation -Patient's shortness of breath on admission was secondary to COPD exacerbation. Over the course of hospitalization this is significantly better. Unfortunately this patient was not using her oxygen at home. In fact she has been off oxygen for the past 2-3 years. -During this hospitalization she  has been started on steroids. She is also received scheduled nebulized bronchodilators. -Over the past few days she has made significant improvement, she is very close to her usual baseline. -Upon further asking her how much she can walk before she got to the hospital and when she was at her baseline, patient claims that she needed to stop every 20 or 30 feet to catch her breath. -She has been seen by both physical therapy and occupational therapy services, home physical therapy, occupational therapy and RN have been ordered. -She desaturates on ambulation to 87%, and as a result home O2 has been arranged. -As noted above, she is requesting that she be discharged home today. She has already called a family member to pick her up; was in fact in the room during my rounds this evening. On examining her lungs are mostly clear with some scattered rhonchi. Patient is not in distress. She is awake and alert, oriented x3. She is not using any of accessory muscles of respiration and is very easily speaking in full sentences. -She will be discharged home at her own request. She will continue on Avelox for the next few days and will be prescribed a prednisone taper -She has been instructed numerous times by me to use oxygen at all times. -She has been instructed to followup with her primary pulmonologist within a week. -Family at bedside does claim that they can keep eye on her 24/ 7. -She is to return to the emergency room or seek immediate medical attention if her shortness of breath worsens or if she were to have high fever.   Active Problems:  HYPERTENSION, BENIGN -She is to continue her usual antihypertensive medications.   CAD, ARTERY BYPASS GRAFT -Cardiac enzymes were cycled and these were negative. She is to followup with Dr. Tonny Bollman from [facility] cardiology within a week.   Chronic systolic heart failure -Her shortness of breath is felt to be from COPD exacerbation and not CHF exacerbation.  She is to continue on Lasix and other usual medications. She is to followup with Dr. Tonny Bollman from [facility] cardiology within a week. She has been asked to call and make an appointment, she claims understanding.     TODAY-DAY OF DISCHARGE:  Subjective:   Naly Bram today has no headache,no chest abdominal pain,no new weakness tingling or numbness, feels much better wants to go home today. She is already on postop and has in fact called a family member to  pick up. Ms. Barcelo in fact wanted to go home yesterday, I convinced her to stay to today.  Objective:   Blood pressure 116/78, pulse 86, temperature 99.2 F (37.3 C), temperature source Oral, resp. rate 22, height 5\' 5"  (1.651 m), weight 98 kg (216 lb 0.8 oz), SpO2 96.00%.  Intake/Output Summary (Last 24 hours) at 09/16/11 1726 Last data filed at 09/16/11 1300  Gross per 24 hour  Intake    480 ml  Output      0 ml  Net    480 ml    Exam Awake Alert, Oriented *3, No new F.N deficits, Normal affect.not in any acute respiratory distress. Can talk in full sentences and is not using any of accessory muscles of respiration.  [place].AT,PERRAL Supple Neck,No JVD, No cervical lymphadenopathy appriciated.  Symmetrical Chest wall movement, Good air movement bilaterally, scattered rhonchi  RRR,No Gallops,Rubs or new Murmurs, No Parasternal Heave +ve B.Sounds, Abd Soft, Non tender, No organomegaly appriciated, No rebound -guarding or rigidity. No Cyanosis, Clubbing or edema, No new Rash or bruise  DISPOSITION:  Patient is being discharged home at her request. She is accepting the risks of disability and death.  DISCHARGE INSTRUCTIONS:    Follow-up Information    Follow up with AVBUERE,EDWIN A. Make an appointment in 1 week.   Contact information:   [address] [place] [address] [phone]       Follow up with Barbaraann Share, MD. Make an appointment in 5 days.   Contact information:   [address]  [address] [employer] [place] [address] [phone]       Follow up with Tonny Bollman, MD. Make an appointment in 1 week.   Contact information:   [address] [place] [address] [phone]          she has been instructed numerous times to use oxygen at all times at 2 L per minute. She'll also been instructed to return  to the emergency room or seek immediate medical attention if her symptoms were to worsen. She claims understanding.  Total Time spent on discharge equals 45 minutes.  SignedJeoffrey Massed 09/16/2011 5:26 PM

## 2011-09-16 NOTE — Progress Notes (Signed)
OT recommending HHOT. Contacted Marie at [facility] and requested [facility] and RN in addition to HHPT and  requested nebulizer, O2 and walker. Requests entered in [facility].

## 2011-09-18 ENCOUNTER — Telehealth: Payer: Self-pay | Admitting: Pulmonary Disease

## 2011-09-18 ENCOUNTER — Ambulatory Visit: Payer: PRIVATE HEALTH INSURANCE | Admitting: Cardiovascular Disease

## 2011-09-18 MED ORDER — BUDESONIDE-FORMOTEROL FUMARATE 160-4.5 MCG/ACT IN AERO: 2.0000 | INHALATION_SPRAY | Freq: Two times a day (BID) | RESPIRATORY_TRACT | Status: DC

## 2011-09-18 NOTE — Telephone Encounter (Signed)
Spoke with pt and verified msg. She is requesting 90 day supply of symbicort sent to [facility]. I sent in rx and she states nothing further needed.

## 2011-10-06 ENCOUNTER — Encounter: Payer: Self-pay | Admitting: Physician Assistant

## 2011-10-06 ENCOUNTER — Ambulatory Visit (INDEPENDENT_AMBULATORY_CARE_PROVIDER_SITE_OTHER): Payer: PRIVATE HEALTH INSURANCE | Admitting: Physician Assistant

## 2011-10-06 DIAGNOSIS — R1013 Epigastric pain: Secondary | ICD-10-CM

## 2011-10-06 DIAGNOSIS — I5022 Chronic systolic (congestive) heart failure: Secondary | ICD-10-CM

## 2011-10-06 DIAGNOSIS — R0602 Shortness of breath: Secondary | ICD-10-CM

## 2011-10-06 DIAGNOSIS — E785 Hyperlipidemia, unspecified: Secondary | ICD-10-CM

## 2011-10-06 DIAGNOSIS — I10 Essential (primary) hypertension: Secondary | ICD-10-CM

## 2011-10-06 DIAGNOSIS — I2510 Atherosclerotic heart disease of native coronary artery without angina pectoris: Secondary | ICD-10-CM

## 2011-10-06 MED ORDER — FAMOTIDINE 20 MG PO TABS: 20.0000 mg | ORAL_TABLET | Freq: Two times a day (BID) | ORAL | Status: DC

## 2011-10-06 NOTE — Assessment & Plan Note (Signed)
Controlled.  

## 2011-10-06 NOTE — Assessment & Plan Note (Addendum)
It has been 3 years since her bypass.  I do not think any of her symptoms represent angina at this time.  Continue aspirin and statin.  I will bring her back in early followup in the next several weeks.  If she continues to have symptoms of indigestion and significant dyspnea on exertion, consider proceeding with Lexiscan Myoview.

## 2011-10-06 NOTE — Patient Instructions (Signed)
Your physician recommends that you schedule a follow-up appointment in: 3 weeks with Tereso Newcomer, PA-C Your physician recommends that you return for lab work in: today (BMP, BNP, CBC) Your physician has recommended you make the following change in your medication: START Pepcid 20 mg twice daily

## 2011-10-06 NOTE — Progress Notes (Signed)
[address] [place], [place]  [address] Phone: [phone] Fax:  [phone]  Date:  10/06/2011   Name:  [name]       DOB:  Jul 08, 1941 MRN:  [id number]  PCP:  Dr. Concepcion Elk Primary Cardiologist:  Dr. Tonny Bollman  Primary Electrophysiologist:  None    History of Present Illness: [name] is a 70 y.o. female who presents for follow up.  She has a history of CAD, status post CABG in 2009, ICM, systolic CHF, HTN, HLP, COPD, chronic dyspnea, thrombocytopenia.  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  She initially presented with an NSTEMI.  Subsequent bypass included a L-LAD, S-OM1 and OM2, S-Dx, S-PDA.  She last saw Dr. Excell Seltzer 5/12 for plans for 6 month followup.  However, she was admitted 11/25-11/28 with community-acquired pneumonia versus COPD exacerbation.  She was treated with antibiotics and prednisone.  Of note, her BNP was elevated.  Her symptoms were not felt to be related to volume overload.  Chest x-ray never demonstrated edema.  Hemoglobin 12.9, potassium 4.5, creatinine 1.19, cardiac enzymes negative x3, TSH 1.377, BNP 1112.  The patient notes that she was set up for outpatient sleep study due to a history of snoring and daytime hypersomnolence.  She has been seen by home physical therapy.  She seems to be doing well since discharge.  Overall, she feels better.  Her oxygen saturations have remained in the high 90s without oxygen.  Her heart rate has been in the 90s to 110s.  She does note indigestion.  This is a fairly new symptom for her.  She notes it with meals.  She denies vomiting or diarrhea, melena or hematochezia.  She has taken TUMS with some relief.  She denies exertional chest pain.  She describes class III dyspnea with exertion.  She denies orthopnea.  She does awaken in the middle of night with wheezing, otherwise no PND.  She denies significant lower extremity edema.  She denies syncope.  Past  Medical History  Diagnosis Date  . Chronic systolic heart failure   . Other emphysema   . Shortness of breath   . Other and unspecified hyperlipidemia   . Essential hypertension, benign   . Coronary atherosclerosis of artery bypass graft   . Cardiomyopathy, ischemic      LVEF 35-40%.    Current Outpatient Prescriptions  Medication Sig Dispense Refill  . albuterol (PROAIR HFA) 108 (90 BASE) MCG/ACT inhaler 1-2 puffs every 4-6 hours as needed  1 Inhaler  3  . albuterol (PROVENTIL) (5 MG/ML) 0.5% nebulizer solution Take 0.5 mLs (2.5 mg total) by nebulization every 2 (two) hours as needed for wheezing or shortness of breath.  20 mL  0  . allopurinol (ZYLOPRIM) 100 MG tablet Take 100 mg by mouth daily.       [name] aspirin 81 MG tablet Take 81 mg by mouth daily.       . budesonide-formoterol (SYMBICORT) 160-4.5 MCG/ACT inhaler Inhale 2 puffs into the lungs 2 (two) times daily.  3 Inhaler  1  . carvedilol (COREG) 6.25 MG tablet Take 6.25 mg by mouth 2 (two) times daily with a meal. Take 1 tab by mouth 2 times a day      . Cetirizine HCl 10 MG CAPS Take 10 mg by mouth daily as needed. For allergy symptoms      . cloNIDine (CATAPRES) 0.1 MG tablet Take 0.1 mg by mouth at bedtime.        [name]  colchicine 0.6 MG tablet Take 0.6 mg by mouth 2 (two) times daily as needed. For gout      . ferrous sulfate 325 (65 FE) MG tablet Take 325 mg by mouth daily with breakfast.        . furosemide (LASIX) 40 MG tablet Take 40 mg by mouth daily.       [name] ipratropium (ATROVENT) 0.02 % nebulizer solution Take 2.5 mLs (0.5 mg total) by nebulization 4 (four) times daily as needed for wheezing.  75 mL  0  . lisinopril (PRINIVIL,ZESTRIL) 20 MG tablet Take 20 mg by mouth daily.       . Multiple Vitamin (MULTIVITAMIN) tablet Take 1 tablet by mouth daily.       . Omega-3 Fatty Acids (FISH OIL) 1000 MG CAPS Take 1 capsule by mouth 3 (three) times daily.       . potassium chloride (KLOR-CON) 10 MEQ CR tablet Take 10 mEq by mouth  daily.       . simvastatin (ZOCOR) 40 MG tablet Take 40 mg by mouth at bedtime.       [name] SPIRIVA HANDIHALER 18 MCG inhalation capsule ONE INHALATION IN HANDIHALER DAILY  30 each  6    Allergies: No Known Allergies  History  Substance Use Topics  . Smoking status: Former Smoker -- 1.0 packs/day for 44 years    Types: Cigarettes    Quit date: 10/20/2007  . Smokeless tobacco: Not on file   Comment: started at age 56s.   . Alcohol Use: No     ROS:  Please see the history of present illness.   All other systems reviewed and negative.   PHYSICAL EXAM: VS:  BP 115/79  Pulse 96  Ht 5\' 5"  (1.651 m)  Wt 213 lb 12.8 oz (96.979 kg)  BMI 35.58 kg/m2 Repeat blood pressure by me: 110/70 bilaterally Well nourished, well developed, in no acute distress HEENT: normal Neck: no JVD Vascular: No carotid bruits; no supraclavicular bruits Cardiac:  Distant S1, S2; RRR; no murmur Lungs:  Decreased breath sounds bilaterally, no wheezing, rhonchi or rales Abd: soft, nontender, no hepatomegaly Ext: no edema Skin: warm and dry Neuro:  CNs 2-12 intact, no focal abnormalities noted  EKG:   Sinus rhythm, heart rate 96, left axis deviation, T wave inversion 1, aVL, poor R-wave progression, inferior Q waves, no significant change compared to prior tracings.  ASSESSMENT AND PLAN:

## 2011-10-06 NOTE — Assessment & Plan Note (Signed)
Arrange fasting lipids.

## 2011-10-06 NOTE — Assessment & Plan Note (Addendum)
Volume appears stable.  Check a BNP and basic metabolic panel as noted.  If her BNP is significantly elevated, I will adjust her Lasix.  Otherwise, I'll have her followup in the next several weeks.  If her heart rate remains elevated, in the 90s, at her followup, consider changing her Carvedilol to metoprolol succinate.

## 2011-10-06 NOTE — Assessment & Plan Note (Signed)
She seems to be still recovering from her recent pneumonia/COPD exacerbation.  I suspect that this accounts for her higher heart rate-likely secondary to increased adrenergic drive due to recovery from her recent acute illness.  I will check a BNP, basic metabolic panel and CBC today to ensure that there has been no significant change since discharge from the hospital.  She will continue current therapy.

## 2011-10-06 NOTE — Assessment & Plan Note (Signed)
This sounds like classing indigestion.  I do not think that she is describing angina.  Dyspepsia has likely been exacerbated by her recent acute illness as well as prednisone and antibiotic therapy.  I have prescribed Pepcid 20 mg twice a day.

## 2011-10-07 ENCOUNTER — Other Ambulatory Visit: Payer: Self-pay | Admitting: *Deleted

## 2011-10-07 DIAGNOSIS — N289 Disorder of kidney and ureter, unspecified: Secondary | ICD-10-CM

## 2011-10-07 LAB — CBC WITH DIFFERENTIAL/PLATELET
Basophils Absolute: 0.1 10*3/uL (ref 0.0–0.1)
Eosinophils Relative: 4.7 % (ref 0.0–5.0)
HCT: 44.2 % (ref 36.0–46.0)
Hemoglobin: 14.6 g/dL (ref 12.0–15.0)
Lymphs Abs: 3.2 10*3/uL (ref 0.7–4.0)
MCV: 87.9 fl (ref 78.0–100.0)
Monocytes Absolute: 0.6 10*3/uL (ref 0.1–1.0)
Monocytes Relative: 5.3 % (ref 3.0–12.0)
Neutro Abs: 7.2 10*3/uL (ref 1.4–7.7)
Platelets: 96 10*3/uL — ABNORMAL LOW (ref 150.0–400.0)
RDW: 15.6 % — ABNORMAL HIGH (ref 11.5–14.6)

## 2011-10-07 LAB — BASIC METABOLIC PANEL
BUN: 20 mg/dL (ref 6–23)
CO2: 30 mEq/L (ref 19–32)
Chloride: 104 mEq/L (ref 96–112)
Creatinine, Ser: 1.5 mg/dL — ABNORMAL HIGH (ref 0.4–1.2)
Potassium: 5.1 mEq/L (ref 3.5–5.1)

## 2011-10-07 LAB — BRAIN NATRIURETIC PEPTIDE: Pro B Natriuretic peptide (BNP): 37 pg/mL (ref 0.0–100.0)

## 2011-10-14 ENCOUNTER — Other Ambulatory Visit (INDEPENDENT_AMBULATORY_CARE_PROVIDER_SITE_OTHER): Payer: PRIVATE HEALTH INSURANCE | Admitting: *Deleted

## 2011-10-14 DIAGNOSIS — N289 Disorder of kidney and ureter, unspecified: Secondary | ICD-10-CM

## 2011-10-14 LAB — BASIC METABOLIC PANEL
CO2: 28 mEq/L (ref 19–32)
Calcium: 8.7 mg/dL (ref 8.4–10.5)
Creatinine, Ser: 1.4 mg/dL — ABNORMAL HIGH (ref 0.4–1.2)
GFR: 46.31 mL/min — ABNORMAL LOW (ref >60.00)
Glucose, Bld: 178 mg/dL — ABNORMAL HIGH (ref 70–99)

## 2011-10-15 ENCOUNTER — Ambulatory Visit (HOSPITAL_BASED_OUTPATIENT_CLINIC_OR_DEPARTMENT_OTHER): Payer: No Typology Code available for payment source | Attending: Internal Medicine

## 2011-10-15 VITALS — Ht 65.0 in | Wt 213.0 lb

## 2011-10-15 DIAGNOSIS — G4733 Obstructive sleep apnea (adult) (pediatric): Secondary | ICD-10-CM | POA: Insufficient documentation

## 2011-10-15 DIAGNOSIS — G4737 Central sleep apnea in conditions classified elsewhere: Secondary | ICD-10-CM | POA: Insufficient documentation

## 2011-10-15 DIAGNOSIS — I4949 Other premature depolarization: Secondary | ICD-10-CM | POA: Insufficient documentation

## 2011-10-15 DIAGNOSIS — R259 Unspecified abnormal involuntary movements: Secondary | ICD-10-CM | POA: Insufficient documentation

## 2011-10-16 NOTE — Progress Notes (Signed)
Addended by: Micki Riley C on: 10/16/2011 01:48 PM   Modules accepted: Orders

## 2011-10-19 NOTE — Progress Notes (Signed)
Addended by: Lacie Scotts on: 10/19/2011 02:16 PM   Modules accepted: Orders

## 2011-10-24 DIAGNOSIS — R259 Unspecified abnormal involuntary movements: Secondary | ICD-10-CM

## 2011-10-24 DIAGNOSIS — G4737 Central sleep apnea in conditions classified elsewhere: Secondary | ICD-10-CM

## 2011-10-24 DIAGNOSIS — G4733 Obstructive sleep apnea (adult) (pediatric): Secondary | ICD-10-CM

## 2011-10-24 DIAGNOSIS — I4949 Other premature depolarization: Secondary | ICD-10-CM

## 2011-10-25 NOTE — Procedures (Signed)
NAME:  [name], [name]                 ACCOUNT NO.:  [id number]  MEDICAL RECORD NO.:  [id number]          PATIENT TYPE:  OUT  LOCATION:  SLEEP CENTER                 FACILITY:  [facility]  PHYSICIAN:  Clinton D. Maple Hudson, MD, FCCP, FACPDATE OF BIRTH:  1939-09-21  DATE OF STUDY:  10/15/2011                           NOCTURNAL POLYSOMNOGRAM  REFERRING PHYSICIAN:  Jeoffrey Massed, MD  REFERRING PHYSICIAN:  Jeoffrey Massed, MD  INDICATION FOR STUDY:  Hypersomnia with sleep apnea.  EPWORTH SLEEPINESS SCORE:  7/24.  BMI 35.4, weight 213 pounds, height 65 inches, neck 15 inches.  MEDICATIONS:  Medications are charted and reviewed.  SLEEP ARCHITECTURE:  Split study protocol.  During the diagnostic phase, total sleep time 123.5 minutes with sleep efficiency 73.7%.  Stage I was 14.6%, stage II 64.8%, stage III 0.4%, REM 20.2% of total sleep time. Sleep latency 35 minutes, REM latency 26 minutes, awake after sleep onset 9.5 minutes.  Arousal index 57.8.  BEDTIME MEDICATION:  None.  RESPIRATORY DATA:  Split study protocol.  Apnea-hypopnea index (AHI) 54.9 per hour.  A total of 113 events was scored including 44 obstructive apneas, 19 central apneas, 2 mixed apneas, 48 hypopneas. Events were not positional.  REM AHI 103.2 per hour.  CPAP was titrated to 7 CWP, at which time, predominance of central apneas became apparent. The technician changed to bilevel (BiPAP) and titrated to final inspiratory pressure of 19 and expiratory pressure of 15 CWP, for AHI 38 per hour, which reflected persistent central apneas.  At 7 CWP using CPAP, obstructive apneas were not seen during 5 minutes of recording time, AHI 106.7 per hour reflecting central apneas.  She wore a medium ResMed Quattro FX full-face mask with heated humidifier.  OXYGEN DATA:  Moderate snoring before CPAP with oxygen desaturation to a nadir of 77% on room air.  With PAP titration, mean oxygen saturation held 94.1% on room air and snoring was  prevented.  CARDIAC DATA:  Sinus rhythm with occasional PVC.  MOVEMENT-PARASOMNIA:  Limb jerks were noted during the diagnostic phase only.  A total of 16 limb jerks were counted, of which 4 were associated with arousal or awakening for periodic limb movement with arousal index of 1.9 per hour.  No bathroom trips.  IMPRESSIONS-RECOMMENDATIONS: 1. 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  Moderate snoring with oxygen desaturation to a     nadir of 77% on room air. 2. CPAP titration to 7 CWP, reduced the number of obstructive apneas,     but left large numbers of central apneas.  She was titrated on     bilevel (BiPAP) to inspiratory pressure of 19 and expiratory     pressure of 15 CWP, which suppressed most respiratory events     leaving a residual AHI of 38 per hour, mostly due to residual     central apneas.  She wore a medium ResMed Quattro FX full-face mask     with heated humidifier. Recommend home trial BiPAP Insp 19, exp 15     cwp. 3. Sleep architecture was markedly fragmented throughout the night by     frequent brief awakenings.  Some of these could be associated with     respiratory or limb jerk events, but many were nonspecific.  Sleep     consolidation may be assisted by the use of a sedative hypnotic if     appropriate. 4. She complains of restless legs in the home environment.  Limb jerks     were recorded during this study with a total of 16 limb jerks of     which 4 were associated with arousal or awakening for an index of     1.9 movement-related awakenings per hour.  These awakenings were     confined to the diagnostic phase before CPAP and may represent     arousal attempts related to respiratory events.  Hopefully, they     will respond to home therapy with BiPAP and the addition of a sleep     medication if appropriate.     Clinton D. Maple Hudson, MD, [facility], FACP Diplomate, Biomedical engineer of Sleep  Medicine Electronically Signed    CDY/MEDQ  D:  10/24/2011 13:23:06  T:  10/25/2011 02:16:39  Job:  [id number]

## 2011-10-27 ENCOUNTER — Encounter: Payer: Self-pay | Admitting: Physician Assistant

## 2011-10-27 ENCOUNTER — Other Ambulatory Visit: Payer: No Typology Code available for payment source | Admitting: *Deleted

## 2011-10-27 ENCOUNTER — Ambulatory Visit (INDEPENDENT_AMBULATORY_CARE_PROVIDER_SITE_OTHER): Payer: No Typology Code available for payment source | Admitting: Physician Assistant

## 2011-10-27 VITALS — BP 120/76 | HR 86 | Ht 65.0 in | Wt 218.8 lb

## 2011-10-27 DIAGNOSIS — R0602 Shortness of breath: Secondary | ICD-10-CM

## 2011-10-27 DIAGNOSIS — I2510 Atherosclerotic heart disease of native coronary artery without angina pectoris: Secondary | ICD-10-CM

## 2011-10-27 DIAGNOSIS — I5022 Chronic systolic (congestive) heart failure: Secondary | ICD-10-CM

## 2011-10-27 DIAGNOSIS — I10 Essential (primary) hypertension: Secondary | ICD-10-CM

## 2011-10-27 LAB — BASIC METABOLIC PANEL
BUN: 10 mg/dL (ref 6–23)
Creatinine, Ser: 1 mg/dL (ref 0.4–1.2)
GFR: 67.62 mL/min (ref >60.00)

## 2011-10-27 MED ORDER — NITROGLYCERIN 0.4 MG SL SUBL: 0.4000 mg | SUBLINGUAL_TABLET | SUBLINGUAL | Status: DC | PRN

## 2011-10-27 NOTE — Progress Notes (Signed)
[address] [place], [place]  [address] Phone: [phone] Fax:  [phone]  Date:  10/27/2011   Name:  [name]       DOB:  Mar 04, 1941 MRN:  [id number]  PCP:  Dr. Concepcion Elk Primary Cardiologist:  Dr. Tonny Bollman  Primary Electrophysiologist:  None    History of Present Illness: [name] is a 71 y.o. female who presents for follow up.  She has a history of CAD, status post CABG in 2009, ICM, systolic CHF, HTN, HLP, COPD, chronic dyspnea, thrombocytopenia.  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  She initially presented with an NSTEMI.  Subsequent bypass included a L-LAD, S-OM1 and OM2, S-Dx, S-PDA.  She last saw Dr. Excell Seltzer 5/12 for plans for 6 month followup.    She was admitted in 08/2011 with community-acquired pneumonia versus COPD exacerbation.   I saw her in followup 12/18.  She was still having some dyspnea.  Her heart rate was also elevated.  I thought that she was still recovering from her recent acute illness.  Labs were obtained: BNP 37, potassium 5.1, creatinine 1.5, hemoglobin 14.6.  Her potassium was discontinued and Lasix dose was decreased.  Follow up labs: Potassium 4, creatinine 1.4.  She was complaining of indigestion.  I placed her on Pepcid.  I brought her back for follow up today to revisit her symptoms.  I felt that she may need stress testing if she continued to have indigestion and significant dyspnea with exertion.  She continues to describe class 3 dyspnea with exertion.  She notes difficulty with shortness of breath for a long time.  However, she does note that her dyspnea with exertion has worsened over the last several months.  She denies chest discomfort.  She denies orthopnea, PND or edema.  She denies syncope.  Of note, she had dyspnea prior to her heart attack and bypass surgery in 2009.  She did not have chest pain at that time.  Past Medical History  Diagnosis Date  . Chronic  systolic heart failure   . Other emphysema   . Shortness of breath   . HLD (hyperlipidemia)   . HTN (hypertension)   . CAD (coronary artery disease)     s/p NSTEMI 2009 - s/p CABG 2009: L-LAD, S-OM1 and OM2, S-Dx, S-PDA.  . Cardiomyopathy, ischemic      LVEF 35-40%.  . Thrombocytopenia   . HIT (heparin-induced thrombocytopenia)     Current Outpatient Prescriptions  Medication Sig Dispense Refill  . albuterol (PROAIR HFA) 108 (90 BASE) MCG/ACT inhaler 1-2 puffs every 4-6 hours as needed  1 Inhaler  3  . albuterol (PROVENTIL) (5 MG/ML) 0.5% nebulizer solution Take 0.5 mLs (2.5 mg total) by nebulization every 2 (two) hours as needed for wheezing or shortness of breath.  20 mL  0  . allopurinol (ZYLOPRIM) 100 MG tablet Take 100 mg by mouth daily.       [name] aspirin 81 MG tablet Take 81 mg by mouth daily.       . budesonide-formoterol (SYMBICORT) 160-4.5 MCG/ACT inhaler Inhale 2 puffs into the lungs 2 (two) times daily.  3 Inhaler  1  . carvedilol (COREG) 6.25 MG tablet Take 6.25 mg by mouth 2 (two) times daily with a meal. Take 1 tab by mouth 2 times a day      . Cetirizine HCl 10 MG CAPS Take 10 mg by mouth daily as needed. For allergy symptoms      .  cloNIDine (CATAPRES) 0.1 MG tablet Take 0.1 mg by mouth at bedtime.        . colchicine 0.6 MG tablet Take 0.6 mg by mouth 2 (two) times daily as needed. For gout      . famotidine (PEPCID) 20 MG tablet Take 1 tablet (20 mg total) by mouth 2 (two) times daily.  60 tablet  3  . ferrous sulfate 325 (65 FE) MG tablet Take 325 mg by mouth daily with breakfast.        . furosemide (LASIX) 40 MG tablet Take 20 mg by mouth daily.       [name] ipratropium (ATROVENT) 0.02 % nebulizer solution Take 2.5 mLs (0.5 mg total) by nebulization 4 (four) times daily as needed for wheezing.  75 mL  0  . lisinopril (PRINIVIL,ZESTRIL) 20 MG tablet Take 20 mg by mouth daily.       . Multiple Vitamin (MULTIVITAMIN) tablet Take 1 tablet by mouth daily.       . Omega-3 Fatty  Acids (FISH OIL) 1000 MG CAPS Take 1 capsule by mouth 3 (three) times daily.       . simvastatin (ZOCOR) 40 MG tablet Take 40 mg by mouth every evening.        [name] SPIRIVA HANDIHALER 18 MCG inhalation capsule ONE INHALATION IN HANDIHALER DAILY  30 each  6    Allergies: No Known Allergies  History  Substance Use Topics  . Smoking status: Former Smoker -- 1.0 packs/day for 44 years    Types: Cigarettes    Quit date: 10/20/2007  . Smokeless tobacco: Not on file   Comment: started at age 16s.   . Alcohol Use: No     ROS:  Please see the history of present illness.   All other systems reviewed and negative.   PHYSICAL EXAM: VS:  BP 120/76  Pulse 86  Ht 5\' 5"  (1.651 m)  Wt 218 lb 12.8 oz (99.247 kg)  BMI 36.41 kg/m2 Well nourished, well developed, in no acute distress HEENT: normal Neck: no JVD Cardiac:  Distant S1, S2; RRR; no murmur Lungs:  Decreased breath sounds bilaterally, no wheezing, rhonchi or rales Abd: soft, nontender, no hepatomegaly Ext: no edema Skin: warm and dry Neuro:  CNs 2-12 intact, no focal abnormalities noted  EKG:   Sinus rhythm, heart rate 87, left axis deviation, incomplete right bundle branch block, poor R-wave progression, no significant change compared to prior tracings.  ASSESSMENT AND PLAN:

## 2011-10-27 NOTE — Assessment & Plan Note (Signed)
Controlled.  Continue current therapy.  

## 2011-10-27 NOTE — Assessment & Plan Note (Signed)
Volume stable with recent medication changes.  Her creatinine has been borderline and her potassium was previously elevated.  Repeat a basic metabolic panel today.

## 2011-10-27 NOTE — Assessment & Plan Note (Signed)
Proceed with stress testing as noted.  Continue aspirin.  Followup with Dr. Tonny Bollman in 3 months.

## 2011-10-27 NOTE — Assessment & Plan Note (Signed)
I suspect that this is multifactorial and related to significant COPD and ischemic cardiomyopathy as well as deconditioning.  However, she has noted a worsening of her dyspnea with exertion over the last several months.  Her anginal equivalent was also dyspnea.  I have recommended that we proceed with stress testing.  If this is unremarkable, no further workup.  If her stress test is abnormal, she may require cardiac catheterization.

## 2011-10-27 NOTE — Patient Instructions (Signed)
Your physician has requested that you have en exercise stress myoview. For further information please visit [email or web address] Please follow instruction sheet, as given.  Your physician recommends that you have BMET lab work today  Your physician wants you to follow-up with Dr.Cooper in 3 months You will receive a reminder letter in the mail two months in advance. If you don't receive a letter, please call our office to schedule the follow-up appointment.

## 2011-10-29 NOTE — Progress Notes (Signed)
Addended by: Lacie Scotts on: 10/29/2011 08:55 AM   Modules accepted: Orders

## 2011-10-30 ENCOUNTER — Telehealth: Payer: Self-pay | Admitting: Pulmonary Disease

## 2011-10-30 MED ORDER — TIOTROPIUM BROMIDE MONOHYDRATE 18 MCG IN CAPS: 18.0000 ug | ORAL_CAPSULE | Freq: Every day | RESPIRATORY_TRACT | Status: DC

## 2011-10-30 NOTE — Telephone Encounter (Signed)
Left detailed message on named VM advising rx for her spiriva was sent and to call back if she had any questions. rx was sent

## 2011-11-03 ENCOUNTER — Ambulatory Visit (HOSPITAL_COMMUNITY): Payer: No Typology Code available for payment source | Attending: Cardiovascular Disease | Admitting: Radiology

## 2011-11-03 DIAGNOSIS — R0602 Shortness of breath: Secondary | ICD-10-CM

## 2011-11-03 DIAGNOSIS — R5383 Other fatigue: Secondary | ICD-10-CM | POA: Insufficient documentation

## 2011-11-03 DIAGNOSIS — E785 Hyperlipidemia, unspecified: Secondary | ICD-10-CM | POA: Insufficient documentation

## 2011-11-03 DIAGNOSIS — J4489 Other specified chronic obstructive pulmonary disease: Secondary | ICD-10-CM | POA: Insufficient documentation

## 2011-11-03 DIAGNOSIS — E669 Obesity, unspecified: Secondary | ICD-10-CM | POA: Insufficient documentation

## 2011-11-03 DIAGNOSIS — I2510 Atherosclerotic heart disease of native coronary artery without angina pectoris: Secondary | ICD-10-CM

## 2011-11-03 DIAGNOSIS — J449 Chronic obstructive pulmonary disease, unspecified: Secondary | ICD-10-CM | POA: Insufficient documentation

## 2011-11-03 DIAGNOSIS — R5381 Other malaise: Secondary | ICD-10-CM | POA: Insufficient documentation

## 2011-11-03 DIAGNOSIS — R0989 Other specified symptoms and signs involving the circulatory and respiratory systems: Secondary | ICD-10-CM | POA: Insufficient documentation

## 2011-11-03 DIAGNOSIS — J438 Other emphysema: Secondary | ICD-10-CM | POA: Insufficient documentation

## 2011-11-03 DIAGNOSIS — Z951 Presence of aortocoronary bypass graft: Secondary | ICD-10-CM | POA: Insufficient documentation

## 2011-11-03 DIAGNOSIS — R0609 Other forms of dyspnea: Secondary | ICD-10-CM | POA: Insufficient documentation

## 2011-11-03 DIAGNOSIS — R079 Chest pain, unspecified: Secondary | ICD-10-CM

## 2011-11-03 DIAGNOSIS — I10 Essential (primary) hypertension: Secondary | ICD-10-CM | POA: Insufficient documentation

## 2011-11-03 DIAGNOSIS — Z87891 Personal history of nicotine dependence: Secondary | ICD-10-CM | POA: Insufficient documentation

## 2011-11-03 DIAGNOSIS — I5022 Chronic systolic (congestive) heart failure: Secondary | ICD-10-CM

## 2011-11-03 MED ORDER — TECHNETIUM TC 99M TETROFOSMIN IV KIT
30.0000 | PACK | Freq: Once | INTRAVENOUS | Status: AC | PRN
  Administered 2011-11-03: 30 via INTRAVENOUS

## 2011-11-03 MED ORDER — REGADENOSON 0.4 MG/5ML IV SOLN
0.4000 mg | Freq: Once | INTRAVENOUS | Status: AC
  Administered 2011-11-03: 0.4 mg via INTRAVENOUS

## 2011-11-03 MED ORDER — TECHNETIUM TC 99M TETROFOSMIN IV KIT
10.0000 | PACK | Freq: Once | INTRAVENOUS | Status: AC | PRN
  Administered 2011-11-03: 10 via INTRAVENOUS

## 2011-11-03 NOTE — Progress Notes (Signed)
[facility] SITE 3 NUCLEAR MED [address] [place] [address] [phone]  Cardiology Nuclear Med Study  [name] is a 71 y.o. female [id number] June 25, 1941   Nuclear Med Background Indication for Stress Test:  Evaluation for Ischemia, Graft Patency and Post [facility] on 11/12 for  "Indigestion", Dyspnea and COPD Exacerbation History:  .Asthma, COPD, Emphysema; '09 NSTEMI>CABG, EF=20%; '09 Echo:EF=35-40% Cardiac Risk Factors: History of Smoking, Hypertension, Lipids and Obesity  Symptoms:  Chest Pain/"Burning" (last episode of chest discomfort was about 2-weeks ago), DOE and Fatigue   Nuclear Pre-Procedure Caffeine/Decaff Intake:  None NPO After: 9:00pm   Lungs:  Clear.  O2 SAT 95% on RA. IV 0.9% NS with Angio Cath:  22g  IV Site: L Wrist  IV Started by:  Milana Na, EMT-P  Chest Size (in):  44 Cup Size: D  Height: 5\' 5"  (1.651 m)  Weight:  219 lb (99.338 kg)  BMI:  Body mass index is 36.44 kg/(m^2). Tech Comments:  No medications today, per patient.    Nuclear Med Study 1 or 2 day study: 1 day  Stress Test Type:  Eugenie Birks  Reading MD: Charlton Haws, MD  Order Authorizing Provider:  Tonny Bollman Tereso Newcomer  Resting Radionuclide: Technetium 39m Tetrofosmin  Resting Radionuclide Dose: 11.0 mCi   Stress Radionuclide:  Technetium 64m Tetrofosmin  Stress Radionuclide Dose: 33.0 mCi           Stress Protocol Rest HR: 76 Stress HR: 83  Rest BP: 152/91 with large cuff Stress BP: 150/88  Exercise Time (min): n/a METS: n/a   Predicted Max HR: 148 bpm % Max HR: 56.08 bpm Rate Pressure Product: 86578   Dose of Adenosine (mg):  n/a Dose of Lexiscan: 0.4 mg  Dose of Atropine (mg): n/a Dose of Dobutamine: n/a mcg/kg/min (at max HR)  Stress Test Technologist: Smiley Houseman, CMA-N  Nuclear Technologist:  Domenic Polite, CNMT     Rest Procedure:  Myocardial perfusion imaging was performed at rest 45 minutes following the intravenous  administration of Technetium 8m Tetrofosmin.  Rest ECG: LAD and occasional PAC's.  Stress Procedure:  The patient received IV Lexiscan 0.4 mg over 15-seconds.  Technetium 69m Tetrofosmin injected at 30-seconds.  There were no significant changes with Lexiscan, rare PAC.  Quantitative spect images were obtained after a 45 minute delay.  Stress ECG: No significant change from baseline ECG  QPS Raw Data Images:  Normal; no motion artifact; normal heart/lung ratio. Stress Images:  There is decreased uptake in the anterior wall. Rest Images:  There is decreased uptake in the anterior wall. Subtraction (SDS):  There is a fixed defect that is most consistent with a previous infarction. Transient Ischemic Dilatation (Normal <1.22):  1.00  Lung/Heart Ratio (Normal <0.45):  0.31  Quantitative Gated Spect Images QGS EDV:  128 ml QGS ESV:  88 ml QGS cine images:  Apical septal distal anterior and inferior wall hypokinesis QGS EF: 31%  Impression Exercise Capacity:  Lexiscan with no exercise. BP Response:  Normal blood pressure response. Clinical Symptoms:  There is dyspnea. ECG Impression:  No significant ST segment change suggestive of ischemia. Comparison with Prior Nuclear Study: No previous nuclear study performed  Overall Impression:  Small inferior wall scar from apex to base.  Moderate anterior apical and septal scar.  No ischemia  EF 31%   Charlton Haws

## 2012-02-25 ENCOUNTER — Ambulatory Visit (INDEPENDENT_AMBULATORY_CARE_PROVIDER_SITE_OTHER): Payer: Medicaid Other | Admitting: Pulmonary Disease

## 2012-02-25 ENCOUNTER — Encounter: Payer: Self-pay | Admitting: Pulmonary Disease

## 2012-02-25 VITALS — BP 160/96 | HR 72 | Temp 98.2°F | Ht 65.0 in | Wt 213.0 lb

## 2012-02-25 DIAGNOSIS — J438 Other emphysema: Secondary | ICD-10-CM

## 2012-02-25 MED ORDER — BUDESONIDE-FORMOTEROL FUMARATE 160-4.5 MCG/ACT IN AERO: 2.0000 | INHALATION_SPRAY | Freq: Two times a day (BID) | RESPIRATORY_TRACT | Status: DC

## 2012-02-25 MED ORDER — TIOTROPIUM BROMIDE MONOHYDRATE 18 MCG IN CAPS: 18.0000 ug | ORAL_CAPSULE | Freq: Every day | RESPIRATORY_TRACT | Status: DC

## 2012-02-25 NOTE — Patient Instructions (Addendum)
Stay on spiriva and symbicort Stop ipratropium (atrovent) in your nebulizer machine.  This is the same as spiriva Only use your albuterol inhaler or nebulizer machine if needed for rescue/emergencies.  Always sit down first for 5 minutes to see if your breathing improves without it. Stay as active as possible.  followup with me in 6mos.

## 2012-02-25 NOTE — Progress Notes (Signed)
  Subjective:    Patient ID: [name], female    DOB: 10-27-1940, 71 y.o.   MRN: [id number]  HPI The patient comes in today for followup of her known moderate COPD.  She is staying on her bronchodilator regimen, and has not had any recent exacerbation or pulmonary infection.  She is using her rescue medication anytime she gets short of breath, and I have encouraged her to sit down first to see if her breathing will improve.  I have explained to her shortness of breath comes from her obesity/deconditioning, heart disease, and finally her underlying COPD.   Review of Systems  Constitutional: Negative for fever and unexpected weight change.  HENT: Negative for ear pain, nosebleeds, congestion, sore throat, rhinorrhea, sneezing, trouble swallowing, dental problem, postnasal drip and sinus pressure.   Eyes: Negative for redness and itching.  Respiratory: Negative for cough, chest tightness, shortness of breath and wheezing.   Cardiovascular: Negative for palpitations and leg swelling.  Gastrointestinal: Negative for nausea and vomiting.  Genitourinary: Negative for dysuria.  Musculoskeletal: Negative for joint swelling.  Skin: Negative for rash.  Neurological: Negative for headaches.  Hematological: Does not bruise/bleed easily.  Psychiatric/Behavioral: Negative for dysphoric mood. The patient is not nervous/anxious.        Objective:   Physical Exam Obese female in no acute distress Nose without purulence or discharge noted Chest with very faint basilar crackles, excellent airflow with no wheezing Cardiac exam with regular rate and rhythm Lower extremities with minimal edema, no cyanosis Alert and oriented, moves all 4 extremities.       Assessment & Plan:

## 2012-02-25 NOTE — Assessment & Plan Note (Signed)
The patient seems to be doing well from a COPD standpoint.  She has no wheezing on exam, and her oxygen saturations are quite good on room air.  She is on an excellent bronchodilator regimen, and I've asked her to continue this.  I have asked her to discontinue Atrovent in her nebulizers, since this is redundant with Spiriva.  I've also encouraged her work aggressively on weight loss and some type of exercise program.

## 2012-08-29 ENCOUNTER — Ambulatory Visit (INDEPENDENT_AMBULATORY_CARE_PROVIDER_SITE_OTHER): Payer: Medicare Other | Admitting: Pulmonary Disease

## 2012-08-29 ENCOUNTER — Encounter: Payer: Self-pay | Admitting: Pulmonary Disease

## 2012-08-29 VITALS — BP 110/82 | HR 88 | Temp 98.3°F | Ht 65.0 in | Wt 207.8 lb

## 2012-08-29 DIAGNOSIS — J438 Other emphysema: Secondary | ICD-10-CM

## 2012-08-29 NOTE — Progress Notes (Signed)
  Subjective:    Patient ID: [name], female    DOB: [birth year], 71 y.o.   MRN: [id number]  HPI The patient comes in today for followup of her known COPD, and multifactorial dyspnea on exertion.  She has been staying on her bronchodilator regimen compliantly, and has not had an acute exacerbation since the last visit.  She has noticed some increasing shortness of breath with the cooler weather, but it is not overly significant at this time.  She denies any significant cough or purulent mucus.   Review of Systems  Constitutional: Negative for fever and unexpected weight change.  HENT: Positive for congestion. Negative for ear pain, nosebleeds, sore throat, rhinorrhea, sneezing, trouble swallowing, dental problem, postnasal drip and sinus pressure.   Eyes: Negative for redness and itching.  Respiratory: Positive for cough, shortness of breath and wheezing. Negative for chest tightness.   Cardiovascular: Negative for palpitations and leg swelling.  Gastrointestinal: Negative for nausea and vomiting.  Genitourinary: Negative for dysuria.  Musculoskeletal: Positive for joint swelling.  Skin: Negative for rash.  Neurological: Negative for headaches.  Hematological: Bruises/bleeds easily.  Psychiatric/Behavioral: Positive for dysphoric mood. The patient is not nervous/anxious.        Objective:   Physical Exam Obese female in no acute distress Nose without purulence or discharge noted Chest with good air flow bilaterally, no wheezing Cardiac exam with regular rate and rhythm Lower extremities with mild edema, no cyanosis Alert and oriented, moves all 4 extremities.       Assessment & Plan:

## 2012-08-29 NOTE — Assessment & Plan Note (Signed)
The patient appears to be stable from a pulmonary standpoint on her current bronchodilator regimen.  She has some variability in her symptoms, and feel that she does have increased shortness of breath since the weather has turned cooler.  However, she is moving air well with no wheezing on exam currently.  I also reminded her that she has many other reasons for shortness of breath with exertion.  At this point, I would like to maintain her on her current medications, but asked her to call me if her breathing gets any worse.  I also stressed to her the importance of weight loss and trying to stay as active as possible.

## 2012-08-29 NOTE — Patient Instructions (Addendum)
No change in breathing medications Try to stay as active as you can, and work on weight loss followup with me in 6mos, but if you feel your breathing is getting worse, let us know.

## 2013-02-14 ENCOUNTER — Encounter: Payer: Self-pay | Admitting: Physician Assistant

## 2013-02-14 ENCOUNTER — Ambulatory Visit (INDEPENDENT_AMBULATORY_CARE_PROVIDER_SITE_OTHER): Payer: Medicare Other | Admitting: Physician Assistant

## 2013-02-14 VITALS — BP 124/72 | HR 96 | Ht 64.0 in | Wt 198.4 lb

## 2013-02-14 DIAGNOSIS — I10 Essential (primary) hypertension: Secondary | ICD-10-CM

## 2013-02-14 DIAGNOSIS — I2510 Atherosclerotic heart disease of native coronary artery without angina pectoris: Secondary | ICD-10-CM

## 2013-02-14 DIAGNOSIS — R0602 Shortness of breath: Secondary | ICD-10-CM

## 2013-02-14 DIAGNOSIS — I5022 Chronic systolic (congestive) heart failure: Secondary | ICD-10-CM

## 2013-02-14 LAB — BASIC METABOLIC PANEL
Calcium: 9.2 mg/dL (ref 8.4–10.5)
GFR: 60.54 mL/min (ref >60.00)
Potassium: 3.8 mEq/L (ref 3.5–5.1)
Sodium: 142 mEq/L (ref 135–145)

## 2013-02-14 LAB — BRAIN NATRIURETIC PEPTIDE: Pro B Natriuretic peptide (BNP): 18 pg/mL (ref 0.0–100.0)

## 2013-02-14 NOTE — Progress Notes (Signed)
[address] [place], [place]  [address] Phone: [phone] Fax:  [phone]  Date:  02/14/2013   ID:  [name], DOB 09-Oct-1941, MRN [id number]  PCP:  Dorrene German, MD  Primary Cardiologist:  Dr. Tonny Bollman     History of Present Illness: [name] is a 72 y.o. female who returns for f/u.  She has a hx of CAD, s/p CABG in 2009, ICM, systolic CHF, HTN, HLP, COPD, chronic dyspnea, thrombocytopenia. 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 She initially presented with an NSTEMI. Subsequent bypass included a L-LAD, S-OM1 and OM2, S-Dx, S-PDA.  She was admitted in 08/2011 with community acquired pneumonia versus COPD exacerbation. Last seen in 10/2011 for f/u on dyspnea and stress test was recommended.  Myoview 11/04/11:  Small inf scar, mod ant apical and septal scar, no ischemia, EF 31%.  Med Rx continued.  Since last seen, she continues to have NYHA Class III dyspnea.  She has occasional CP.  This is burning and brief.  It occurs with meals and is relieved by antacids.  She already takes Pepcid.  She denies syncope.  No orthopnea, PND, edema.    Labs (11/12):  TSH 1.372 Labs (12/12):  BNP 37, Hgb 14.6, Plt 96K Labs (1/13):    K 3.7, Cr 1.0  Wt Readings from Last 3 Encounters:  02/14/13 198 lb 6.4 oz (89.994 kg)  08/29/12 207 lb 12.8 oz (94.257 kg)  02/25/12 213 lb (96.616 kg)     Past Medical History  Diagnosis Date  . Chronic systolic heart failure   . Other emphysema   . Shortness of breath   . HLD (hyperlipidemia)   . HTN (hypertension)   . CAD (coronary artery disease)     s/p NSTEMI 2009 - s/p CABG 2009: L-LAD, S-OM1 and OM2, S-Dx, S-PDA.;  Myoview 11/04/11:  Small inf scar, mod ant apical and septal scar, no ischemia, EF 31%.  . Cardiomyopathy, ischemic      LVEF 35-40%.  . Thrombocytopenia   . HIT (heparin-induced thrombocytopenia)     Current Outpatient Prescriptions  Medication Sig Dispense Refill   . albuterol (PROAIR HFA) 108 (90 BASE) MCG/ACT inhaler 1-2 puffs every 4-6 hours as needed  1 Inhaler  3  . albuterol (PROVENTIL) (5 MG/ML) 0.5% nebulizer solution Take 0.5 mLs (2.5 mg total) by nebulization every 2 (two) hours as needed for wheezing or shortness of breath.  20 mL  0  . allopurinol (ZYLOPRIM) 100 MG tablet Take 100 mg by mouth daily.       [name] aspirin 81 MG tablet Take 81 mg by mouth daily.       . budesonide-formoterol (SYMBICORT) 160-4.5 MCG/ACT inhaler Inhale 2 puffs into the lungs 2 (two) times daily.  3 Inhaler  3  . carvedilol (COREG) 6.25 MG tablet Take 6.25 mg by mouth 2 (two) times daily with a meal. Take 1 tab by mouth 2 times a day      . Cetirizine HCl 10 MG CAPS Take 10 mg by mouth daily as needed. For allergy symptoms      . cloNIDine (CATAPRES) 0.1 MG tablet Take 0.1 mg by mouth at bedtime.        . colchicine 0.6 MG tablet Take 0.6 mg by mouth 2 (two) times daily as needed. For gout      . famotidine (PEPCID) 20 MG tablet Take 1 tablet (20 mg total) by mouth 2 (two) times daily.  60 tablet  3  . furosemide (LASIX) 40 MG tablet Take 20 mg by mouth daily.       [name] HYDROcodone-acetaminophen (VICODIN) 5-500 MG per tablet Take 1 tablet by mouth 2 (two) times daily.       [name] ipratropium (ATROVENT) 0.02 % nebulizer solution Take 2.5 mLs (0.5 mg total) by nebulization 4 (four) times daily as needed for wheezing.  75 mL  0  . IRON PO Take 1 capsule by mouth every other day.      [name] KLOR-CON 10 10 MEQ tablet Take 10 mEq by mouth once.       [name] lisinopril (PRINIVIL,ZESTRIL) 20 MG tablet Take 20 mg by mouth daily.       . Multiple Vitamin (MULTIVITAMIN) tablet Take 1 tablet by mouth daily.       . Omega-3 Fatty Acids (FISH OIL) 1000 MG CAPS Take 1 capsule by mouth 3 (three) times daily.       . simvastatin (ZOCOR) 40 MG tablet Take 40 mg by mouth every evening.        . tiotropium (SPIRIVA HANDIHALER) 18 MCG inhalation capsule Place 1 capsule (18 mcg total) into inhaler and inhale  daily.  90 capsule  3  . VOLTAREN 1 % GEL 2 g as needed.       . nitroGLYCERIN (NITROSTAT) 0.4 MG SL tablet Place 1 tablet (0.4 mg total) under the tongue every 5 (five) minutes as needed for chest pain.  25 tablet  12   No current facility-administered medications for this visit.    Allergies:   No Known Allergies  Social History:  The patient  reports that she quit smoking about 5 years ago. Her smoking use included Cigarettes. She has a 44 pack-year smoking history. She does not have any smokeless tobacco history on file. She reports that she does not drink alcohol or use illicit drugs.   ROS:  Please see the history of present illness.   She feels depressed. She denies suicidal ideations.   All other systems reviewed and negative.   PHYSICAL EXAM: VS:  BP 124/72  Pulse 96  Ht 5\' 4"  (1.626 m)  Wt 198 lb 6.4 oz (89.994 kg)  BMI 34.04 kg/m2 Well nourished, well developed, in no acute distress HEENT: normal Neck: no JVD Cardiac:  normal S1, S2; RRR; no murmur Lungs:  Decreased breath sounds bilaterally, no wheezing, rhonchi or rales Abd: soft, nontender, no hepatomegaly Ext: no edema Skin: warm and dry Neuro:  CNs 2-12 intact, no focal abnormalities noted  EKG:  NSR, HR 96, LAD, poor R wave progression, NSSTTW changes, no change from prior tracing     ASSESSMENT AND PLAN:  1. CAD:  She has atypical chest pains. These seem to be more consistent with dyspepsia. She had a low risk Myoview a little over one year ago. No further cardiovascular testing is warranted at this time. She will continue aspirin and statin. 2. Chronic Systolic CHF:  She has chronic dyspnea. Volume looks stable on exam. I will obtain a basic metabolic panel and BNP today. If her BNP is significantly elevated, adjust furosemide. We could consider the addition of spironolactone. However, she is currently suffering with symptoms of depression. I would hold off on the addition of any further medications at this time.   I will arrange a follow up echocardiogram to reassess LV function. 3. Ischemic Cardiomyopathy:  Continue current therapy. Ejection fraction at her recent stress test was 31%. I will follow up  on her ejection fraction with an echocardiogram.  If her EF is truly below 35%, consider referral to EP. 4. Depression:  I have recommended that she follow up with her PCP for further recommendations. 5. Hypertension: Controlled. 6. Hyperlipidemia: Continue statin. 7. Disposition: Follow up with Dr. Excell Seltzer in 6 months.  Signed, Tereso Newcomer, PA-C  12:15 PM 02/14/2013

## 2013-02-14 NOTE — Patient Instructions (Addendum)
OK TO STOP PEPCID IF NOT WORKING FOR YOU AND OK TO START OTC PRILOSEC 20 MG DAILY  LABS TODAY; BMET, BNP  PLEASE FOLLOW UP WITH DR. Excell Seltzer IN 6 MONTHS  PLEASE SCHEDULE ECHO

## 2013-02-15 ENCOUNTER — Telehealth: Payer: Self-pay | Admitting: *Deleted

## 2013-02-15 NOTE — Telephone Encounter (Signed)
Message copied by Tarri Fuller on Wed Feb 15, 2013 10:57 AM ------      Message from: [place], [place] T      Created: Tue Feb 14, 2013  5:07 PM       Potassium and creatinine okay      BNP normal      Continue with current treatment plan.      Tereso Newcomer, PA-C  5:06 PM 02/14/2013 ------

## 2013-02-15 NOTE — Telephone Encounter (Signed)
pt notified about lab results with verbal understanding  

## 2013-02-27 ENCOUNTER — Ambulatory Visit: Payer: Medicare Other | Admitting: Pulmonary Disease

## 2013-03-06 ENCOUNTER — Other Ambulatory Visit: Payer: Self-pay | Admitting: Internal Medicine

## 2013-03-06 DIAGNOSIS — R1013 Epigastric pain: Secondary | ICD-10-CM

## 2013-03-07 ENCOUNTER — Encounter: Payer: Self-pay | Admitting: Physician Assistant

## 2013-03-07 ENCOUNTER — Ambulatory Visit (HOSPITAL_COMMUNITY): Payer: Medicare Other | Attending: Cardiovascular Disease | Admitting: Radiology

## 2013-03-07 DIAGNOSIS — I2510 Atherosclerotic heart disease of native coronary artery without angina pectoris: Secondary | ICD-10-CM | POA: Insufficient documentation

## 2013-03-07 DIAGNOSIS — J449 Chronic obstructive pulmonary disease, unspecified: Secondary | ICD-10-CM | POA: Insufficient documentation

## 2013-03-07 DIAGNOSIS — I252 Old myocardial infarction: Secondary | ICD-10-CM | POA: Insufficient documentation

## 2013-03-07 DIAGNOSIS — R0989 Other specified symptoms and signs involving the circulatory and respiratory systems: Secondary | ICD-10-CM

## 2013-03-07 DIAGNOSIS — I509 Heart failure, unspecified: Secondary | ICD-10-CM | POA: Insufficient documentation

## 2013-03-07 DIAGNOSIS — R0602 Shortness of breath: Secondary | ICD-10-CM

## 2013-03-07 DIAGNOSIS — J4489 Other specified chronic obstructive pulmonary disease: Secondary | ICD-10-CM | POA: Insufficient documentation

## 2013-03-07 DIAGNOSIS — I5022 Chronic systolic (congestive) heart failure: Secondary | ICD-10-CM | POA: Insufficient documentation

## 2013-03-07 DIAGNOSIS — Z87891 Personal history of nicotine dependence: Secondary | ICD-10-CM | POA: Insufficient documentation

## 2013-03-07 DIAGNOSIS — I10 Essential (primary) hypertension: Secondary | ICD-10-CM | POA: Insufficient documentation

## 2013-03-07 DIAGNOSIS — E785 Hyperlipidemia, unspecified: Secondary | ICD-10-CM | POA: Insufficient documentation

## 2013-03-07 DIAGNOSIS — R0609 Other forms of dyspnea: Secondary | ICD-10-CM | POA: Insufficient documentation

## 2013-03-07 DIAGNOSIS — E669 Obesity, unspecified: Secondary | ICD-10-CM | POA: Insufficient documentation

## 2013-03-07 DIAGNOSIS — I2589 Other forms of chronic ischemic heart disease: Secondary | ICD-10-CM | POA: Insufficient documentation

## 2013-03-07 NOTE — Progress Notes (Signed)
Echocardiogram performed.  

## 2013-03-10 ENCOUNTER — Ambulatory Visit
Admission: RE | Admit: 2013-03-10 | Discharge: 2013-03-10 | Disposition: A | Discharge status: Home / Self Care | Payer: Medicare Other | Source: Ambulatory Visit | Attending: Internal Medicine | Admitting: Internal Medicine

## 2013-03-10 DIAGNOSIS — K3189 Other diseases of stomach and duodenum: Secondary | ICD-10-CM

## 2013-03-23 ENCOUNTER — Encounter: Payer: Self-pay | Admitting: Internal Medicine

## 2013-03-23 ENCOUNTER — Ambulatory Visit (INDEPENDENT_AMBULATORY_CARE_PROVIDER_SITE_OTHER): Payer: Medicare Other | Admitting: Internal Medicine

## 2013-03-23 ENCOUNTER — Ambulatory Visit: Payer: Medicare Other | Admitting: Internal Medicine

## 2013-03-23 VITALS — BP 110/69 | HR 81 | Ht 64.0 in | Wt 193.0 lb

## 2013-03-23 DIAGNOSIS — I2589 Other forms of chronic ischemic heart disease: Secondary | ICD-10-CM

## 2013-03-23 DIAGNOSIS — I509 Heart failure, unspecified: Secondary | ICD-10-CM

## 2013-03-23 DIAGNOSIS — E782 Mixed hyperlipidemia: Secondary | ICD-10-CM

## 2013-03-23 DIAGNOSIS — I255 Ischemic cardiomyopathy: Secondary | ICD-10-CM | POA: Insufficient documentation

## 2013-03-23 MED ORDER — ISOSORB DINITRATE-HYDRALAZINE 20-37.5 MG PO TABS: ORAL_TABLET | ORAL | Status: DC

## 2013-03-23 MED ORDER — ATORVASTATIN CALCIUM 80 MG PO TABS: 80.0000 mg | ORAL_TABLET | Freq: Every day | ORAL | Status: DC

## 2013-03-23 NOTE — Assessment & Plan Note (Signed)
The patient has persistent ischemic cardiomyopathy with depressed left ventricular function. She is moderately limited because of her concomitant lung disease.   She is on double therapy for her cardiomyopathy with adjunctive therapy for her blood pressure. I wonder, given the fact that her EF is  borderline 30-35%,as to whether the addition of hydralazine/nitrates and her Aldactone might not suffice to get her ejection fraction device 30s and thereby mitigate the indication and the value of ICD.  We had a lengthy discussion today regarding risks and issues. We will plan to begin her on hydralazine nitrates. She'll followup with Tereso Newcomer in 6-8 weeks. If the blood pressure allows and labs would permit I would begin Aldactone with reassessment of left ventricular function about 2 months or 3 months thereafter.

## 2013-03-23 NOTE — Assessment & Plan Note (Signed)
She is euvolemic. She has no recumbent edema. I wonder to what degree her shortness of breath is COPD. I have told her that there would be no advantage to her breathing with an ICD. Her QRS is borderline long but in a pattern of IVCD and as such would not be appropriately considered for CRT

## 2013-03-23 NOTE — Patient Instructions (Addendum)
Your physician has recommended you make the following change in your medication:  1) Stop clonidine 2) Start Bi-Dil 20/37.5 mg once daily 3) Stop simvastatin 4) Start lipitor 80 mg once daily  Your physician has requested that you have an echocardiogram in 3-4 months. Echocardiography is a painless test that uses sound waves to create images of your heart. It provides your doctor with information about the size and shape of your heart and how well your heart's chambers and valves are working. This procedure takes approximately one hour. There are no restrictions for this procedure.  Your physician recommends that you schedule a follow-up appointment in: 6-8 weeks with Tereso Newcomer, PA

## 2013-03-23 NOTE — Progress Notes (Signed)
ELECTROPHYSIOLOGY CONSULT NOTE  Patient ID: [name], MRN: [id number], DOB/AGE: [birth year] 72 y.o. Admit date: (Not on file) Date of Consult: 03/23/2013  Primary Physician: Dorrene German, MD Primary Cardiologist: mc  Chief Complaint: Ledora [name]    HPI [name] is a 72 y.o. female   SEEN for consideration of ICD implantation  She has a history of ischemic heart disease. In 2009 she presented which he thought was pneumonia. She was found to have significant ischemic heart disease and underwent bypass surgery x5. She underwent Myoview testing January 2013 at that time her ejection fraction 31%. There is no ischemia. This was done in the context of recurring atypical chest pain characterized by brief stabs. They're often related to sitting and not with exertion.  She recently underwent repeat echo evaluation which demonstrated persistent left ventricular dysfunction with ejection fraction of 30-35%. She is referred now for consideration of ICD implantation.  Functionally she is very limited. She is a less than 100-200 feet. This is largely related to her COPD. She does not have peripheral edema or nocturnal dyspnea.  She's had no palpitations or syncope.  she does have hypertension; she is treated with ACE inhibitors, beta blockers and clonidine.        Past Medical History  Diagnosis Date  . Chronic systolic heart failure   . Other emphysema   . Shortness of breath   . HLD (hyperlipidemia)   . HTN (hypertension)   . CAD (coronary artery disease)     s/p NSTEMI 2009 - s/p CABG 2009: L-LAD, S-OM1 and OM2, S-Dx, S-PDA.;  Myoview 11/04/11:  Small inf scar, mod ant apical and septal scar, no ischemia, EF 31%.  . Cardiomyopathy, ischemic     a.  LVEF 35-40%;  b. Echo 5/14: Moderate LVH, EF 30-35%, mid to distal anteroseptal and apical HK  . Thrombocytopenia   . HIT (heparin-induced thrombocytopenia)       Surgical History:  Past Surgical History  Procedure Laterality Date  .  Coronary artery bypass graft  November 21, 2007    5 vessel     Home Meds: Prior to Admission medications   Medication Sig Start Date End Date Taking? Authorizing Provider  albuterol (PROAIR HFA) 108 (90 BASE) MCG/ACT inhaler 1-2 puffs every 4-6 hours as needed 01/08/11  Yes Barbaraann Share, MD  albuterol (PROVENTIL) (5 MG/ML) 0.5% nebulizer solution Take 0.5 mLs (2.5 mg total) by nebulization every 2 (two) hours as needed for wheezing or shortness of breath. 09/16/11 03/23/13 Yes Shanker Levora Dredge, MD  allopurinol (ZYLOPRIM) 100 MG tablet Take 100 mg by mouth daily.    Yes Historical Provider, MD  aspirin 81 MG tablet Take 81 mg by mouth daily.    Yes Historical Provider, MD  budesonide-formoterol (SYMBICORT) 160-4.5 MCG/ACT inhaler Inhale 2 puffs into the lungs 2 (two) times daily. 02/25/12 03/23/13 Yes Barbaraann Share, MD  carvedilol (COREG) 6.25 MG tablet Take 6.25 mg by mouth 2 (two) times daily with a meal. Take 1 tab by mouth 2 times a day   Yes Historical Provider, MD  Cetirizine HCl 10 MG CAPS Take 10 mg by mouth daily as needed. For allergy symptoms   Yes Historical Provider, MD  cloNIDine (CATAPRES) 0.1 MG tablet Take 0.1 mg by mouth at bedtime.     Yes Historical Provider, MD  colchicine 0.6 MG tablet Take 0.6 mg by mouth 2 (two) times daily as needed. For gout   Yes Historical Provider, MD  furosemide (LASIX) 40  MG tablet Take 20 mg by mouth daily.    Yes Historical Provider, MD  HYDROcodone-acetaminophen (VICODIN) 5-500 MG per tablet Take 1 tablet by mouth 2 (two) times daily.  07/29/12  Yes Historical Provider, MD  ipratropium (ATROVENT) 0.02 % nebulizer solution Take 2.5 mLs (0.5 mg total) by nebulization 4 (four) times daily as needed for wheezing. 09/16/11 03/23/13 Yes Shanker Levora Dredge, MD  KLOR-CON 10 10 MEQ tablet Take 10 mEq by mouth once.  08/04/12  Yes Historical Provider, MD  lisinopril (PRINIVIL,ZESTRIL) 20 MG tablet Take 20 mg by mouth daily.    Yes Historical Provider, MD    Multiple Vitamin (MULTIVITAMIN) tablet Take 1 tablet by mouth daily.    Yes Historical Provider, MD  nitroGLYCERIN (NITROSTAT) 0.4 MG SL tablet Place 1 tablet (0.4 mg total) under the tongue every 5 (five) minutes as needed for chest pain. 10/27/11 03/23/13 Yes Scott T Weaver, PA-C  omeprazole (PRILOSEC) 10 MG capsule Take 10 mg by mouth daily.   Yes Historical Provider, MD  simvastatin (ZOCOR) 40 MG tablet Take 40 mg by mouth every evening.     Yes Historical Provider, MD  tiotropium (SPIRIVA HANDIHALER) 18 MCG inhalation capsule Place 1 capsule (18 mcg total) into inhaler and inhale daily. 02/25/12 03/23/13 Yes Barbaraann Share, MD  VOLTAREN 1 % GEL 2 g as needed.  07/12/12  Yes Historical Provider, MD    Allergies: No Known Allergies  History   Social History  . Marital Status: Legally Separated    Spouse Name: N/A    Number of Children: N/A  . Years of Education: N/A   Occupational History  . does not currently work    Social History Main Topics  . Smoking status: Former Smoker -- 1.00 packs/day for 44 years    Types: Cigarettes    Quit date: 10/20/2007  . Smokeless tobacco: Not on file     Comment: started at age 77s.   . Alcohol Use: No  . Drug Use: No  . Sexually Active: Not on file   Other Topics Concern  . Not on file   Social History Narrative   Pt lives alone in [place].    Children live nearby     Family History  Problem Relation Age of Onset  . Asthma Mother   . Hypertension Mother   . Diabetes Mother   . Rheum arthritis Mother      ROS:  Please see the history of present illness.   Negative except Anxiety, arthritis, and fatigue  All other systems reviewed and negative.    Physical Exam:   Blood pressure 110/69, pulse 81, height 5\' 4"  (1.626 m), weight 193 lb (87.544 kg). General: Well developed, well nourished older age appearing African American female in no acute distress. Head: Normocephalic, atraumatic, sclera non-icteric, no xanthomas, nares are  without discharge. EENT: normal Lymph Nodes:  none Back: with /kyphosis , no CVA tendersness Neck: Negative for carotid bruits. JVD not elevated. Lungs: Clear bilaterally to auscultation without wheezes, rales, or rhonchi. Breathing is unlabored. Heart: RRR with S1 S2. No murmur , rubs, or gallops appreciated. Abdomen: Soft, non-tender, non-distended with normoactive bowel sounds. No hepatomegaly. No rebound/guarding. No obvious abdominal masses. Msk:  Strength and tone appear normal for age. Extremities: No clubbing or cyanosis. No  edema.  Distal pedal pulses are 2+ and equal bilaterally. Skin: Warm and Dry Neuro: Alert and oriented X 3. CN III-XII intact Grossly normal sensory and motor function . Psych:  Responds  to questions appropriately with a normal affect.      Labs: Cardiac Enzymes No results found for this basename: CKTOTAL, CKMB, TROPONINI,  in the last 72 hours CBC Lab Results  Component Value Date   WBC 11.6* 10/06/2011   HGB 14.6 10/06/2011   HCT 44.2 10/06/2011   MCV 87.9 10/06/2011   PLT 96.0* 10/06/2011   PROTIME: No results found for this basename: LABPROT, INR,  in the last 72 hours Chemistry No results found for this basename: NA, K, CL, CO2, BUN, CREATININE, CALCIUM, LABALBU, PROT, BILITOT, ALKPHOS, ALT, AST, GLUCOSE,  in the last 168 hours Lipids Lab Results  Component Value Date   CHOL 122 02/28/2010   HDL 29.00* 02/28/2010   LDLCALC 68 02/28/2010   TRIG 125.0 02/28/2010   BNP Pro B Natriuretic peptide (BNP)  Date/Time Value Range Status  02/14/2013 12:47 PM 18.0  0.0 - 100.0 pg/mL Final  10/06/2011  4:21 PM 37.0  0.0 - 100.0 pg/mL Final  09/16/2011  5:30 AM 1112.0* 0 - 125 pg/mL Final  09/14/2011  5:40 AM 640.5* 0 - 125 pg/mL Final   Miscellaneous Lab Results  Component Value Date   DDIMER  Value: 1.05        AT THE INHOUSE ESTABLISHED CUTOFF VALUE OF 0.48 ug/mL FEU, THIS ASSAY HAS BEEN DOCUMENTED IN THE LITERATURE TO HAVE* 11/12/2007     Radiology/Studies:  US Abdomen Complete  03/10/2013   *RADIOLOGY REPORT*  Clinical Data:  Generalized abdominal pain, diarrhea  COMPLETE ABDOMINAL ULTRASOUND  Comparison:  None.  Findings:  Gallbladder:  The gallbladder is visualized and no gallstones are noted.  There is no pain over the gallbladder with compression.  Common bile duct:  The common bile duct is normal measuring 3.6 mm in diameter.  Liver:  The liver is somewhat inhomogeneous which may indicate mild fatty infiltration.  No focal abnormality is seen.  IVC:  Appears normal.  Pancreas:  The tail of the pancreas is obscured by bowel gas.  The head and body is unremarkable.  Spleen:  The spleen is normal measuring 7.0 cm sagittally.  Right Kidney:  No hydronephrosis is seen.  The right kidney measures 10.5 cm sagittally.  Left Kidney:  No hydronephrosis is noted.  The left kidney measures 12.1 cm.  Abdominal aorta:  The abdominal aorta is normal in caliber, partially obscured by bowel gas.  IMPRESSION:  1.  No gallstones.  No ductal dilatation. 2.  Slightly inhomogeneous liver may indicate mild fatty infiltration. 3.  The tail of the pancreas is obscured by bowel gas.   Original Report Authenticated By: Dwyane Dee, M.D.    EKG:  Sinus rhythm at 81 intervals [birth year] Axis leftward at -99   Assessment and Plan:    Sherryl Manges

## 2013-03-30 ENCOUNTER — Telehealth: Payer: Self-pay | Admitting: *Deleted

## 2013-03-30 DIAGNOSIS — I509 Heart failure, unspecified: Secondary | ICD-10-CM

## 2013-03-30 MED ORDER — ISOSORB DINITRATE-HYDRALAZINE 20-37.5 MG PO TABS: ORAL_TABLET | ORAL | Status: DC

## 2013-03-30 NOTE — Telephone Encounter (Signed)
I called the patient to clarify a message that was left on paper from the CMA's off the refill line. Patient was recently seen in the office with Dr. Graciela Husbands and an RX for bidil was sent to the patient's pharmacy for a BID dosing. According to the patient's call back, the RX did not initally go through to the pharmacy. On her patient instructions, I had written for a once daily dosing in error. CMA filled the RX for once daily dosing. Clarified with Dr. Graciela Husbands this should be BID. I have notified the patient and will contact her pharmacy to clarify this when her RX is renewed next. She voices understanding. Sherri Rad, RN, BSN

## 2013-05-04 ENCOUNTER — Ambulatory Visit (INDEPENDENT_AMBULATORY_CARE_PROVIDER_SITE_OTHER): Payer: Medicare Other | Admitting: Physician Assistant

## 2013-05-04 ENCOUNTER — Encounter: Payer: Self-pay | Admitting: Physician Assistant

## 2013-05-04 VITALS — BP 120/62 | HR 82 | Ht 65.0 in | Wt 197.0 lb

## 2013-05-04 DIAGNOSIS — I10 Essential (primary) hypertension: Secondary | ICD-10-CM

## 2013-05-04 DIAGNOSIS — I5022 Chronic systolic (congestive) heart failure: Secondary | ICD-10-CM

## 2013-05-04 DIAGNOSIS — Z79899 Other long term (current) drug therapy: Secondary | ICD-10-CM

## 2013-05-04 DIAGNOSIS — I2589 Other forms of chronic ischemic heart disease: Secondary | ICD-10-CM

## 2013-05-04 DIAGNOSIS — I2510 Atherosclerotic heart disease of native coronary artery without angina pectoris: Secondary | ICD-10-CM

## 2013-05-04 LAB — BASIC METABOLIC PANEL
CO2: 28 mEq/L (ref 19–32)
Calcium: 8.8 mg/dL (ref 8.4–10.5)
Creatinine, Ser: 1.2 mg/dL (ref 0.4–1.2)
Glucose, Bld: 155 mg/dL — ABNORMAL HIGH (ref 70–99)

## 2013-05-04 MED ORDER — SPIRONOLACTONE 25 MG PO TABS: ORAL_TABLET | ORAL | Status: DC

## 2013-05-04 NOTE — Progress Notes (Signed)
[address], [address] [place], [place]  [address] Phone: [phone] Fax:  [phone]  Date:  05/04/2013   ID:  [name], DOB 07-04-1941, MRN [id number]  PCP:  Dorrene German, MD  Primary Cardiologist:  Dr. Tonny Bollman  Electrophysiologist:  Dr. Sherryl Manges    History of Present Illness: [name] is a 72 y.o. female who returns for f/u.    She has a hx of CAD, s/p CABG in 2009, ICM, systolic CHF, HTN, HLP, COPD, chronic dyspnea, thrombocytopenia. 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 She initially presented with an NSTEMI. Subsequent bypass included a L-LAD, S-OM1 and OM2, S-Dx, S-PDA.  She was admitted in 08/2011 with community acquired pneumonia versus COPD exacerbation. Last seen in 10/2011 for f/u on dyspnea and stress test was recommended.  Myoview 11/04/11:  Small inf scar, mod ant apical and septal scar, no ischemia, EF 31%.  Med Rx continued.  She has reported NYHA Class III dyspnea.  She was seen in follow up 01/2013. Follow up echocardiogram 5/14: Moderate LVH, EF 30-35%, anteroseptal and apical HK. She was set up to see Dr. Graciela Husbands for consideration of ICD implantation. He recommended advancing her medical therapy 1st to see if her ejection fraction would improve above 35%,  therefore obviating the need for prophylactic ICD implantation.  Clonidine was stopped and she was placed on hydralazine and nitrates.  She is brought back for further medication titration with an eye towards starting spironolactone.   Since last being seen, she is doing well. She has occasional atypical chest pains. These are brief and are nonexertional. She continues to have significant dyspnea with exertion. She describes NYHA class III symptoms. She denies orthopnea, PND or edema. She denies syncope.  Labs (11/12):  TSH 1.372 Labs (12/12):  BNP 37, Hgb 14.6, Plt 96K Labs (1/13):    K 3.7, Cr 1.0 Labs (4/14):    K 3.8, Cr 1.1, proBNP 18  Wt  Readings from Last 3 Encounters:  05/04/13 197 lb (89.359 kg)  03/23/13 193 lb (87.544 kg)  02/14/13 198 lb 6.4 oz (89.994 kg)     Past Medical History  Diagnosis Date  . Chronic systolic heart failure   . Other emphysema   . Shortness of breath   . HLD (hyperlipidemia)   . HTN (hypertension)   . CAD (coronary artery disease)     s/p NSTEMI 2009 - s/p CABG 2009: L-LAD, S-OM1 and OM2, S-Dx, S-PDA.;  Myoview 11/04/11:  Small inf scar, mod ant apical and septal scar, no ischemia, EF 31%.  . Cardiomyopathy, ischemic     a.  LVEF 35-40%;  b. Echo 5/14: Moderate LVH, EF 30-35%, mid to distal anteroseptal and apical HK  . Thrombocytopenia   . HIT (heparin-induced thrombocytopenia)     Current Outpatient Prescriptions  Medication Sig Dispense Refill  . albuterol (PROAIR HFA) 108 (90 BASE) MCG/ACT inhaler 1-2 puffs every 4-6 hours as needed  1 Inhaler  3  . albuterol (PROVENTIL) (5 MG/ML) 0.5% nebulizer solution Take 0.5 mLs (2.5 mg total) by nebulization every 2 (two) hours as needed for wheezing or shortness of breath.  20 mL  0  . allopurinol (ZYLOPRIM) 100 MG tablet Take 100 mg by mouth daily.       [name] aspirin 81 MG tablet Take 81 mg by mouth daily.       [name] atorvastatin (LIPITOR) 80 MG tablet Take 1 tablet (80 mg total) by mouth daily.  30 tablet  6  . budesonide-formoterol (SYMBICORT) 160-4.5 MCG/ACT inhaler Inhale 2 puffs into the lungs 2 (two) times daily.  3 Inhaler  3  . carvedilol (COREG) 6.25 MG tablet Take 6.25 mg by mouth 2 (two) times daily with a meal. Take 1 tab by mouth 2 times a day      . Cetirizine HCl 10 MG CAPS Take 10 mg by mouth daily as needed. For allergy symptoms      . colchicine 0.6 MG tablet Take 0.6 mg by mouth 2 (two) times daily as needed. For gout      . furosemide (LASIX) 40 MG tablet Take 20 mg by mouth daily.       [name] HYDROcodone-acetaminophen (VICODIN) 5-500 MG per tablet Take 1 tablet by mouth 2 (two) times daily.       [name] ipratropium (ATROVENT) 0.02 %  nebulizer solution Take 2.5 mLs (0.5 mg total) by nebulization 4 (four) times daily as needed for wheezing.  75 mL  0  . isosorbide-hydrALAZINE (BIDIL) 20-37.5 MG per tablet Take one tablet by mouth twice daily  60 tablet  6  . KLOR-CON 10 10 MEQ tablet Take 10 mEq by mouth once.       [name] lisinopril (PRINIVIL,ZESTRIL) 20 MG tablet Take 20 mg by mouth daily.       . Multiple Vitamin (MULTIVITAMIN) tablet Take 1 tablet by mouth daily.       . nitroGLYCERIN (NITROSTAT) 0.4 MG SL tablet Place 1 tablet (0.4 mg total) under the tongue every 5 (five) minutes as needed for chest pain.  25 tablet  12  . omeprazole (PRILOSEC) 10 MG capsule Take 10 mg by mouth daily.      [name] tiotropium (SPIRIVA HANDIHALER) 18 MCG inhalation capsule Place 1 capsule (18 mcg total) into inhaler and inhale daily.  90 capsule  3  . VOLTAREN 1 % GEL 2 g as needed.        No current facility-administered medications for this visit.    Allergies:   No Known Allergies  Social History:  The patient  reports that she quit smoking about 5 years ago. Her smoking use included Cigarettes. She has a 44 pack-year smoking history. She does not have any smokeless tobacco history on file. She reports that she does not drink alcohol or use illicit drugs.   ROS:  Please see the history of present illness.    All other systems reviewed and negative.   PHYSICAL EXAM: VS:  BP 120/62  Pulse 82  Ht 5\' 5"  (1.651 m)  Wt 197 lb (89.359 kg)  BMI 32.78 kg/m2 Well nourished, well developed, in no acute distress HEENT: normal Neck: no JVD Cardiac:  normal S1, S2; RRR; no murmur Lungs:  Decreased breath sounds bilaterally, no wheezing, rhonchi or rales Abd: soft, nontender, no hepatomegaly Ext: no edema Skin: warm and dry Neuro:  CNs 2-12 intact, no focal abnormalities noted  EKG:  NSR, HR 82, LAD, poor R wave progression, NSSTTW changes,PACs, no change from prior tracing     ASSESSMENT AND PLAN:  1. CAD:  Stable. No obvious angina. She had  a low risk Myoview 10/2011. Continue aspirin and statin. 2. Chronic Systolic CHF:  Volume stable. Continue current dose of Lasix. 3. Ischemic Cardiomyopathy:  She is now on beta blocker, ACE inhibitor and hydralazine/nitrate. She is taking by BiDil twice daily. At this point, I will stop her potassium and start her on spironolactone 12.5 mg daily. Check a basic metabolic  panel today, in 5 days and again in 12 days. Consider advancing BiDil to 3 times a day at follow up. 4. Hypertension: Controlled. 5. Hyperlipidemia: Continue statin. 6. Disposition: Follow up with me in 3 weeks.  SignedTereso Newcomer, PA-C  11:48 AM 05/04/2013

## 2013-05-04 NOTE — Patient Instructions (Addendum)
Will obtain labs today and call you with the results (bmet)  STOP YOUR POTASSIUM  START SPIRONOLACTONE 12.5 MG DAILY (1/2 of a 25 mg tablet)  Will need for you to return in 5 days and 12 days for labs only  Your physician recommends that you schedule a follow-up appointment in: 3 weeks with Bing Neighbors PA

## 2013-05-05 ENCOUNTER — Telehealth: Payer: Self-pay | Admitting: Internal Medicine

## 2013-05-05 NOTE — Telephone Encounter (Signed)
Spoke with patient about recent lab results 

## 2013-05-05 NOTE — Telephone Encounter (Signed)
Follow Up      Pt calling in returning call from earlier. Please call back.

## 2013-05-05 NOTE — Progress Notes (Signed)
LMTCB

## 2013-05-05 NOTE — Progress Notes (Signed)
Discussed with patient

## 2013-05-09 ENCOUNTER — Other Ambulatory Visit (INDEPENDENT_AMBULATORY_CARE_PROVIDER_SITE_OTHER): Payer: Medicare Other

## 2013-05-09 DIAGNOSIS — I2589 Other forms of chronic ischemic heart disease: Secondary | ICD-10-CM

## 2013-05-09 DIAGNOSIS — I5022 Chronic systolic (congestive) heart failure: Secondary | ICD-10-CM

## 2013-05-09 LAB — BASIC METABOLIC PANEL
BUN: 16 mg/dL (ref 6–23)
Creatinine, Ser: 1.2 mg/dL (ref 0.4–1.2)
GFR: 57.55 mL/min — ABNORMAL LOW (ref >60.00)

## 2013-05-12 ENCOUNTER — Telehealth: Payer: Self-pay | Admitting: *Deleted

## 2013-05-12 NOTE — Telephone Encounter (Signed)
lmom labs ok, no changes to be made 

## 2013-05-12 NOTE — Telephone Encounter (Signed)
Message copied by Tarri Fuller on Fri May 12, 2013  9:23 AM ------      Message from: [place], [place] T      Created: Thu May 11, 2013 10:43 PM       Potassium and kidney function ok      Continue with current treatment plan.      Tereso Newcomer, PA-C        05/11/2013 10:43 PM ------

## 2013-05-16 ENCOUNTER — Other Ambulatory Visit (INDEPENDENT_AMBULATORY_CARE_PROVIDER_SITE_OTHER): Payer: Medicare Other

## 2013-05-16 DIAGNOSIS — I2589 Other forms of chronic ischemic heart disease: Secondary | ICD-10-CM

## 2013-05-16 DIAGNOSIS — I5022 Chronic systolic (congestive) heart failure: Secondary | ICD-10-CM

## 2013-05-16 LAB — BASIC METABOLIC PANEL
BUN: 14 mg/dL (ref 6–23)
GFR: 58.12 mL/min — ABNORMAL LOW (ref >60.00)
Glucose, Bld: 103 mg/dL — ABNORMAL HIGH (ref 70–99)
Potassium: 3.5 mEq/L (ref 3.5–5.1)

## 2013-05-25 ENCOUNTER — Encounter: Payer: Self-pay | Admitting: Physician Assistant

## 2013-05-25 ENCOUNTER — Ambulatory Visit (INDEPENDENT_AMBULATORY_CARE_PROVIDER_SITE_OTHER): Payer: Medicare Other | Admitting: Physician Assistant

## 2013-05-25 VITALS — BP 98/62 | HR 100 | Ht 65.0 in | Wt 195.0 lb

## 2013-05-25 DIAGNOSIS — I959 Hypotension, unspecified: Secondary | ICD-10-CM

## 2013-05-25 DIAGNOSIS — I2589 Other forms of chronic ischemic heart disease: Secondary | ICD-10-CM

## 2013-05-25 DIAGNOSIS — I10 Essential (primary) hypertension: Secondary | ICD-10-CM

## 2013-05-25 DIAGNOSIS — I5022 Chronic systolic (congestive) heart failure: Secondary | ICD-10-CM

## 2013-05-25 DIAGNOSIS — I2510 Atherosclerotic heart disease of native coronary artery without angina pectoris: Secondary | ICD-10-CM

## 2013-05-25 DIAGNOSIS — R000 Tachycardia, unspecified: Secondary | ICD-10-CM

## 2013-05-25 LAB — BASIC METABOLIC PANEL
BUN: 19 mg/dL (ref 6–23)
CO2: 28 mEq/L (ref 19–32)
Calcium: 8.9 mg/dL (ref 8.4–10.5)
Creatinine, Ser: 1.3 mg/dL — ABNORMAL HIGH (ref 0.4–1.2)
Glucose, Bld: 136 mg/dL — ABNORMAL HIGH (ref 70–99)

## 2013-05-25 LAB — CBC WITH DIFFERENTIAL/PLATELET
Basophils Absolute: 0 10*3/uL (ref 0.0–0.1)
Eosinophils Absolute: 0.2 10*3/uL (ref 0.0–0.7)
Lymphocytes Relative: 32.9 % (ref 12.0–46.0)
MCHC: 32.3 g/dL (ref 30.0–36.0)
Neutrophils Relative %: 55.9 % (ref 43.0–77.0)
Platelets: 106 10*3/uL — ABNORMAL LOW (ref 150.0–400.0)
RBC: 4.68 Mil/uL (ref 3.87–5.11)
RDW: 15.1 % — ABNORMAL HIGH (ref 11.5–14.6)

## 2013-05-25 MED ORDER — LISINOPRIL 20 MG PO TABS: 10.0000 mg | ORAL_TABLET | Freq: Every day | ORAL | Status: DC

## 2013-05-25 NOTE — Patient Instructions (Addendum)
DECREASE LISINOPRIL TO 10MG  1/2 TABLET DAILY  HOLD LASIX  LABS TODAY: BMET, CBC  Your physician recommends that you return for lab work in: 1 WEEK BMET  PLEASE SCHEDULE A NURSE BP VISIT IN 1 WEEK   WEIGH DAILY IF WEIGHT IS UP 3 BLS IN ONE DAY TAKE LASIX AND CALL OUR OFFICE  Follow up with Dr.Cooper/S.Weaver,PA-C 1 week after your echo thats already scheduled for  06/2013

## 2013-05-25 NOTE — Progress Notes (Signed)
[address], [address] [place], [place]  [address] Phone: [phone] Fax:  [phone]  Date:  05/25/2013   ID:  [name], DOB [birth year], MRN [id number]  PCP:  No primary provider on file.  Primary Cardiologist:  Dr. Tonny Bollman  Electrophysiologist:  Dr. Sherryl Manges    History of Present Illness: Amar Keenum is a 72 y.o. female who returns for f/u.    She has a hx of CAD, s/p CABG in 2009, ICM, systolic CHF, HTN, HLP, COPD, chronic dyspnea, thrombocytopenia. 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 She initially presented with an NSTEMI. Subsequent bypass included a L-LAD, S-OM1 and OM2, S-Dx, S-PDA.  She was admitted in 08/2011 with community acquired pneumonia versus COPD exacerbation.  Myoview 11/04/11:  Small inf scar, mod ant apical and septal scar, no ischemia, EF 31%.  Med Rx continued.  Follow up echocardiogram 5/14: Moderate LVH, EF 30-35%, anteroseptal and apical HK. She was set up to see Dr. Graciela Husbands for consideration of ICD implantation. He recommended advancing her medical therapy 1st to see if her ejection fraction would improve above 35%,  therefore obviating the need for prophylactic ICD implantation.  Clonidine was stopped and she was placed on hydralazine and nitrates.    I saw her last 05/04/13. I placed on spironolactone.  Since then she has felt well.  The patient denies chest pain, shortness of breath, syncope, orthopnea, PND or significant pedal edema.  She denies weakness or dizziness.    Labs (11/12):  TSH 1.372 Labs (12/12):  BNP 37, Hgb 14.6, Plt 96K Labs (1/13):    K 3.7, Cr 1.0 Labs (4/14):    K 3.8, Cr 1.1, proBNP 18 Labs (7/14):    K 3.4=>3.5=>3.5, Cr 1.2   Wt Readings from Last 3 Encounters:  05/25/13 195 lb (88.451 kg)  05/04/13 197 lb (89.359 kg)  03/23/13 193 lb (87.544 kg)     Past Medical History  Diagnosis Date  . Chronic systolic heart failure   . Other emphysema   . Shortness of breath    . HLD (hyperlipidemia)   . HTN (hypertension)   . CAD (coronary artery disease)     s/p NSTEMI 2009 - s/p CABG 2009: L-LAD, S-OM1 and OM2, S-Dx, S-PDA.;  Myoview 11/04/11:  Small inf scar, mod ant apical and septal scar, no ischemia, EF 31%.  . Cardiomyopathy, ischemic     a.  LVEF 35-40%;  b. Echo 5/14: Moderate LVH, EF 30-35%, mid to distal anteroseptal and apical HK  . Thrombocytopenia   . HIT (heparin-induced thrombocytopenia)     Current Outpatient Prescriptions  Medication Sig Dispense Refill  . albuterol (PROAIR HFA) 108 (90 BASE) MCG/ACT inhaler 1-2 puffs every 4-6 hours as needed  1 Inhaler  3  . albuterol (PROVENTIL) (5 MG/ML) 0.5% nebulizer solution Take 0.5 mLs (2.5 mg total) by nebulization every 2 (two) hours as needed for wheezing or shortness of breath.  20 mL  0  . allopurinol (ZYLOPRIM) 100 MG tablet Take 100 mg by mouth daily.       [name] aspirin 81 MG tablet Take 81 mg by mouth daily.       [name] atorvastatin (LIPITOR) 80 MG tablet Take 1 tablet (80 mg total) by mouth daily.  30 tablet  6  . budesonide-formoterol (SYMBICORT) 160-4.5 MCG/ACT inhaler Inhale 2 puffs into the lungs 2 (two) times daily.  3 Inhaler  3  . carvedilol (COREG) 6.25 MG tablet Take  6.25 mg by mouth 2 (two) times daily with a meal. Take 1 tab by mouth 2 times a day      . Cetirizine HCl 10 MG CAPS Take 10 mg by mouth daily as needed. For allergy symptoms      . colchicine 0.6 MG tablet Take 0.6 mg by mouth 2 (two) times daily as needed. For gout      . furosemide (LASIX) 40 MG tablet Take 20 mg by mouth daily.       [name] HYDROcodone-acetaminophen (VICODIN) 5-500 MG per tablet Take 1 tablet by mouth 2 (two) times daily.       [name] ipratropium (ATROVENT) 0.02 % nebulizer solution Take 2.5 mLs (0.5 mg total) by nebulization 4 (four) times daily as needed for wheezing.  75 mL  0  . isosorbide-hydrALAZINE (BIDIL) 20-37.5 MG per tablet Take one tablet by mouth twice daily  60 tablet  6  . lisinopril (PRINIVIL,ZESTRIL)  20 MG tablet Take 20 mg by mouth daily.       . Multiple Vitamin (MULTIVITAMIN) tablet Take 1 tablet by mouth daily.       . nitroGLYCERIN (NITROSTAT) 0.4 MG SL tablet Place 1 tablet (0.4 mg total) under the tongue every 5 (five) minutes as needed for chest pain.  25 tablet  12  . omeprazole (PRILOSEC) 10 MG capsule Take 10 mg by mouth daily.      [name] spironolactone (ALDACTONE) 25 MG tablet Take 1/2 tablet daily  15 tablet  3  . tiotropium (SPIRIVA HANDIHALER) 18 MCG inhalation capsule Place 1 capsule (18 mcg total) into inhaler and inhale daily.  90 capsule  3  . VOLTAREN 1 % GEL 2 g as needed.        No current facility-administered medications for this visit.    Allergies:   No Known Allergies  Social History:  The patient  reports that she quit smoking about 5 years ago. Her smoking use included Cigarettes. She has a 44 pack-year smoking history. She does not have any smokeless tobacco history on file. She reports that she does not drink alcohol or use illicit drugs.   ROS:  Please see the history of present illness.    All other systems reviewed and negative.   PHYSICAL EXAM: VS:  BP 98/62  Pulse 100  Ht 5\' 5"  (1.651 m)  Wt 195 lb (88.451 kg)  BMI 32.45 kg/m2 Well nourished, well developed, in no acute distress HEENT: normal Neck: no JVD Cardiac:  normal S1, S2; RRR; no murmur Lungs:  Decreased breath sounds bilaterally, no wheezing, rhonchi or rales Abd: soft, nontender, no hepatomegaly Ext: no edema Skin: warm and dry Neuro:  CNs 2-12 intact, no focal abnormalities noted  EKG:  NSR, HR 100, LAD, poor R wave progression, NSSTTW changes,PACs, no change from prior tracing     ASSESSMENT AND PLAN:  1. CAD:  Stable. No obvious angina. She had a low risk Myoview 10/2011. Continue aspirin and statin. 2. Chronic Systolic CHF:  Volume is stable. Blood pressure is low today and she is somewhat tachycardic. She is asymptomatic. When I checked her blood pressure it was 88 systolic. I  will stop her Lasix. I had a long discussion with her regarding weighing herself daily. She knows to weigh daily and to call if she gains 3 pounds over 24 hours. I will cut her lisinopril down to 10 mg a day. Check a basic metabolic panel and CBC today. Repeat a basic metabolic panel in one  week along with a blood pressure check with the nurse. I suspect that she will not be able to titrate her medications any further given her low blood pressure. 3. Ischemic Cardiomyopathy:  Adjust medications as noted. She has a followup echocardiogram pending next month. 4. Hypertension: Controlled. 5. Hyperlipidemia: Continue statin. 6. Disposition: Follow up with Dr. Excell Seltzer or me after her echocardiogram next month.  SignedTereso Newcomer, PA-C  11:54 AM 05/25/2013

## 2013-06-02 ENCOUNTER — Telehealth: Payer: Self-pay | Admitting: *Deleted

## 2013-06-02 ENCOUNTER — Ambulatory Visit: Payer: Medicare Other

## 2013-06-02 ENCOUNTER — Ambulatory Visit (INDEPENDENT_AMBULATORY_CARE_PROVIDER_SITE_OTHER): Payer: Medicare Other | Admitting: Physician Assistant

## 2013-06-02 DIAGNOSIS — I5022 Chronic systolic (congestive) heart failure: Secondary | ICD-10-CM

## 2013-06-02 DIAGNOSIS — I10 Essential (primary) hypertension: Secondary | ICD-10-CM

## 2013-06-02 LAB — BASIC METABOLIC PANEL
Chloride: 107 mEq/L (ref 96–112)
Potassium: 3.1 mEq/L — ABNORMAL LOW (ref 3.5–5.1)
Sodium: 140 mEq/L (ref 135–145)

## 2013-06-02 MED ORDER — POTASSIUM CHLORIDE ER 10 MEQ PO TBCR: 40.0000 meq | EXTENDED_RELEASE_TABLET | ORAL | Status: DC

## 2013-06-02 NOTE — Telephone Encounter (Signed)
pt notified about lab results with low K+ 3.1 today; Per DOD DR. McAlhany who advised to have pt take K+ 40 meq daily for the next 3 days, with bmet tuesday. Pt verbalized understanding to plan of care

## 2013-06-06 ENCOUNTER — Other Ambulatory Visit (INDEPENDENT_AMBULATORY_CARE_PROVIDER_SITE_OTHER): Payer: Medicare Other

## 2013-06-06 DIAGNOSIS — I5022 Chronic systolic (congestive) heart failure: Secondary | ICD-10-CM

## 2013-06-06 LAB — BASIC METABOLIC PANEL
CO2: 23 mEq/L (ref 19–32)
Glucose, Bld: 107 mg/dL — ABNORMAL HIGH (ref 70–99)
Potassium: 3.8 mEq/L (ref 3.5–5.1)
Sodium: 138 mEq/L (ref 135–145)

## 2013-06-23 ENCOUNTER — Other Ambulatory Visit (HOSPITAL_COMMUNITY): Payer: Medicare Other

## 2013-06-23 ENCOUNTER — Ambulatory Visit (HOSPITAL_COMMUNITY): Payer: Medicare Other | Attending: Cardiology

## 2013-06-23 DIAGNOSIS — I059 Rheumatic mitral valve disease, unspecified: Secondary | ICD-10-CM | POA: Insufficient documentation

## 2013-06-23 DIAGNOSIS — Z87891 Personal history of nicotine dependence: Secondary | ICD-10-CM | POA: Insufficient documentation

## 2013-06-23 DIAGNOSIS — I079 Rheumatic tricuspid valve disease, unspecified: Secondary | ICD-10-CM | POA: Insufficient documentation

## 2013-06-23 DIAGNOSIS — I379 Nonrheumatic pulmonary valve disorder, unspecified: Secondary | ICD-10-CM | POA: Insufficient documentation

## 2013-06-23 DIAGNOSIS — J4489 Other specified chronic obstructive pulmonary disease: Secondary | ICD-10-CM | POA: Insufficient documentation

## 2013-06-23 DIAGNOSIS — E785 Hyperlipidemia, unspecified: Secondary | ICD-10-CM | POA: Insufficient documentation

## 2013-06-23 DIAGNOSIS — I2589 Other forms of chronic ischemic heart disease: Secondary | ICD-10-CM | POA: Insufficient documentation

## 2013-06-23 DIAGNOSIS — R079 Chest pain, unspecified: Secondary | ICD-10-CM | POA: Insufficient documentation

## 2013-06-23 DIAGNOSIS — I509 Heart failure, unspecified: Secondary | ICD-10-CM | POA: Insufficient documentation

## 2013-06-23 DIAGNOSIS — I255 Ischemic cardiomyopathy: Secondary | ICD-10-CM

## 2013-06-23 DIAGNOSIS — I2510 Atherosclerotic heart disease of native coronary artery without angina pectoris: Secondary | ICD-10-CM | POA: Insufficient documentation

## 2013-06-23 DIAGNOSIS — J449 Chronic obstructive pulmonary disease, unspecified: Secondary | ICD-10-CM | POA: Insufficient documentation

## 2013-06-23 NOTE — Progress Notes (Signed)
Echocardiogram performed.  

## 2013-06-27 ENCOUNTER — Telehealth: Payer: Self-pay | Admitting: *Deleted

## 2013-06-27 ENCOUNTER — Encounter: Payer: Self-pay | Admitting: Physician Assistant

## 2013-06-27 ENCOUNTER — Ambulatory Visit (INDEPENDENT_AMBULATORY_CARE_PROVIDER_SITE_OTHER): Payer: Medicare Other | Admitting: Physician Assistant

## 2013-06-27 VITALS — BP 152/80 | HR 78 | Ht 65.0 in | Wt 204.0 lb

## 2013-06-27 DIAGNOSIS — I2589 Other forms of chronic ischemic heart disease: Secondary | ICD-10-CM

## 2013-06-27 DIAGNOSIS — I5023 Acute on chronic systolic (congestive) heart failure: Secondary | ICD-10-CM

## 2013-06-27 DIAGNOSIS — I5022 Chronic systolic (congestive) heart failure: Secondary | ICD-10-CM

## 2013-06-27 DIAGNOSIS — I10 Essential (primary) hypertension: Secondary | ICD-10-CM

## 2013-06-27 DIAGNOSIS — I2510 Atherosclerotic heart disease of native coronary artery without angina pectoris: Secondary | ICD-10-CM

## 2013-06-27 LAB — BASIC METABOLIC PANEL
CO2: 30 mEq/L (ref 19–32)
Calcium: 8.4 mg/dL (ref 8.4–10.5)
GFR: 73 mL/min (ref >60.00)
Glucose, Bld: 99 mg/dL (ref 70–99)
Potassium: 3.6 mEq/L (ref 3.5–5.1)
Sodium: 142 mEq/L (ref 135–145)

## 2013-06-27 MED ORDER — FUROSEMIDE 20 MG PO TABS: 20.0000 mg | ORAL_TABLET | Freq: Every day | ORAL | Status: DC

## 2013-06-27 MED ORDER — SPIRONOLACTONE 25 MG PO TABS: ORAL_TABLET | ORAL | Status: DC

## 2013-06-27 NOTE — Patient Instructions (Addendum)
RESTART LASIX TAKE 40MG  DAILY FOR 2 DAYS THEN DECREASE TO 20 MG DAILY   INCREASE SPIRONOLACTONE TO 25 MG DAILY  LABS TODAY: BMET  LABS IN 1 WEEK :BMET   NURSE VISIT TO CHECK BP AND WEIGHT IN 1 WEEK  FOLLOW UP WITH DR.COOPER IN 3 MONTHS

## 2013-06-27 NOTE — Progress Notes (Signed)
[address], [address] [place], [place]  [address] Phone: [phone] Fax:  [phone]  Date:  06/27/2013   ID:  [name], DOB Mar 01, 1941, MRN [id number]  PCP:  No primary provider on file.  Primary Cardiologist:  Dr. Tonny Bollman  Electrophysiologist:  Dr. Sherryl Manges    History of Present Illness: [name] is a 72 y.o. female who returns for f/u.    She has a hx of CAD, s/p CABG in 2009, ICM, systolic CHF, HTN, HLP, COPD, chronic dyspnea, thrombocytopenia. 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 She initially presented with an NSTEMI. Subsequent bypass included a L-LAD, S-OM1 and OM2, S-Dx, S-PDA.  She was admitted in 08/2011 with community acquired pneumonia versus COPD exacerbation.  Myoview 11/04/11:  Small inf scar, mod ant apical and septal scar, no ischemia, EF 31%.  Med Rx continued.  Follow up echocardiogram 5/14: Moderate LVH, EF 30-35%, anteroseptal and apical HK. She was set up to see Dr. Graciela Husbands for consideration of ICD implantation. He recommended advancing her medical therapy 1st to see if her EF would improve above 35%,  therefore obviating the need for prophylactic ICD implantation.  Clonidine was stopped and she was placed on hydralazine and nitrates.    I saw her last 05/25/13. Her blood pressure was running low. I decreased her lisinopril and stop her Lasix.  Follow up echo 06/23/13:  Mild focal basal septal hypertrophy, EF 35-40%, anteroseptal and apical HK, grade 2 diastolic dysfunction, mild MR, reduced RV systolic function, moderate TR, PASP 47.  Since last seen she notes some mild increased DOE.  She also notes increased cough that typically occurs with increased humidity.  She has been using her inhalers.  She denies orthopnea, PND.  She notes some mild increased LE edema.  No CP.  No syncope.  She is NYHA Class III.   Labs (11/12):  TSH 1.372 Labs (12/12):  BNP 37, Hgb 14.6, Plt 96K Labs (1/13):    K 3.7, Cr  1.0 Labs (4/14):    K 3.8, Cr 1.1, proBNP 18 Labs (7/14):    K 3.4=>3.5=>3.5, Cr 1.2  Labs (8/14):    K 3.8, Cr 1.2, Hgb 13.4  Wt Readings from Last 3 Encounters:  06/27/13 204 lb (92.534 kg)  06/02/13 198 lb (89.812 kg)  05/25/13 195 lb (88.451 kg)     Past Medical History  Diagnosis Date  . Chronic systolic heart failure   . Other emphysema   . Shortness of breath   . HLD (hyperlipidemia)   . HTN (hypertension)   . CAD (coronary artery disease)     s/p NSTEMI 2009 - s/p CABG 2009: L-LAD, S-OM1 and OM2, S-Dx, S-PDA.;  Myoview 11/04/11:  Small inf scar, mod ant apical and septal scar, no ischemia, EF 31%.  . Cardiomyopathy, ischemic     a.  LVEF 35-40%;  b. Echo 5/14: Moderate LVH, EF 30-35%, mid to distal anteroseptal and apical HK  . Thrombocytopenia   . HIT (heparin-induced thrombocytopenia)     Current Outpatient Prescriptions  Medication Sig Dispense Refill  . albuterol (PROAIR HFA) 108 (90 BASE) MCG/ACT inhaler 1-2 puffs every 4-6 hours as needed  1 Inhaler  3  . albuterol (PROVENTIL) (5 MG/ML) 0.5% nebulizer solution Take 0.5 mLs (2.5 mg total) by nebulization every 2 (two) hours as needed for wheezing or shortness of breath.  20 mL  0  . allopurinol (ZYLOPRIM) 100 MG tablet Take 100 mg by  mouth daily.       [name] aspirin 81 MG tablet Take 81 mg by mouth daily.       [name] atorvastatin (LIPITOR) 80 MG tablet Take 1 tablet (80 mg total) by mouth daily.  30 tablet  6  . budesonide-formoterol (SYMBICORT) 160-4.5 MCG/ACT inhaler Inhale 2 puffs into the lungs 2 (two) times daily.  3 Inhaler  3  . carvedilol (COREG) 6.25 MG tablet Take 6.25 mg by mouth 2 (two) times daily with a meal. Take 1 tab by mouth 2 times a day      . colchicine 0.6 MG tablet Take 0.6 mg by mouth 2 (two) times daily as needed. For gout      . HYDROcodone-acetaminophen (VICODIN) 5-500 MG per tablet Take 1 tablet by mouth 2 (two) times daily.       [name] ipratropium (ATROVENT) 0.02 % nebulizer solution Take 2.5 mLs (0.5  mg total) by nebulization 4 (four) times daily as needed for wheezing.  75 mL  0  . isosorbide-hydrALAZINE (BIDIL) 20-37.5 MG per tablet Take one tablet by mouth twice daily  60 tablet  6  . lisinopril (PRINIVIL,ZESTRIL) 20 MG tablet Take 0.5 tablets (10 mg total) by mouth daily.      . Multiple Vitamin (MULTIVITAMIN) tablet Take 1 tablet by mouth daily.       . nitroGLYCERIN (NITROSTAT) 0.4 MG SL tablet Place 1 tablet (0.4 mg total) under the tongue every 5 (five) minutes as needed for chest pain.  25 tablet  12  . omeprazole (PRILOSEC) 10 MG capsule Take 10 mg by mouth daily.      [name] spironolactone (ALDACTONE) 25 MG tablet Take 1/2 tablet daily  15 tablet  3  . tiotropium (SPIRIVA HANDIHALER) 18 MCG inhalation capsule Place 1 capsule (18 mcg total) into inhaler and inhale daily.  90 capsule  3  . VOLTAREN 1 % GEL 2 g as needed.        No current facility-administered medications for this visit.    Allergies:   No Known Allergies  Social History:  The patient  reports that she quit smoking about 5 years ago. Her smoking use included Cigarettes. She has a 44 pack-year smoking history. She does not have any smokeless tobacco history on file. She reports that she does not drink alcohol or use illicit drugs.   ROS:  Please see the history of present illness.    All other systems reviewed and negative.   PHYSICAL EXAM: VS:  BP 152/80  Pulse 78  Ht 5\' 5"  (1.651 m)  Wt 204 lb (92.534 kg)  BMI 33.95 kg/m2 Well nourished, well developed, in no acute distress HEENT: normal Neck: mildly elevated JVD Cardiac:  normal S1, S2; RRR; no murmur Lungs:  Decreased breath sounds bilaterally, no wheezing, rhonchi or rales Abd: soft, nontender, no hepatomegaly Ext: trace bilateral LE edema Skin: warm and dry Neuro:  CNs 2-12 intact, no focal abnormalities noted  EKG:  NSR, HR 78, LAD, poor R wave progression, NSSTTW changes,PACs, no change from prior tracing     ASSESSMENT AND PLAN:  1. CAD:   Stable. No obvious angina. She had a low risk Myoview 10/2011. Continue aspirin and statin. 2. Acute on Chronic Systolic CHF:  Volume status increased a little since I held her Lasix.  I will have her take Lasix 40 mg today and tomorrow, then continue on Lasix 20 mg QD.  I will also increase her spironolactone to 25 mg QD.  Check  BMET today and repeat in 1 week.  3. Ischemic Cardiomyopathy:  I reviewed recent echo findings with Dr. Tonny Bollman.  Now that her EF is > 35%, will continue med Rx and not pursue follow up with EP.  4. Hypertension:  Somewhat elevated today. Adjust meds as noted. 5. Hyperlipidemia: Continue statin. 6. Disposition: Follow up with Dr. Excell Seltzer in 3 mos.  SignedTereso Newcomer, PA-C  11:59 AM 06/27/2013

## 2013-06-27 NOTE — Telephone Encounter (Signed)
pt notified about lab results with verbal understanding  

## 2013-06-28 ENCOUNTER — Telehealth: Payer: Self-pay | Admitting: *Deleted

## 2013-06-28 NOTE — Telephone Encounter (Signed)
Pt has been notified per Bing Neighbors. PA and Dr. Excell Seltzer pt does not need to keep f/u w/Dr. Graciela Husbands regarding ICD since her EF is now > 35%. Pt said thank you so much and verbalized understanding to this today.

## 2013-07-06 ENCOUNTER — Encounter: Payer: Self-pay | Admitting: *Deleted

## 2013-07-06 ENCOUNTER — Ambulatory Visit (INDEPENDENT_AMBULATORY_CARE_PROVIDER_SITE_OTHER): Payer: Medicare Other | Admitting: *Deleted

## 2013-07-06 ENCOUNTER — Other Ambulatory Visit (INDEPENDENT_AMBULATORY_CARE_PROVIDER_SITE_OTHER): Payer: Medicare Other

## 2013-07-06 VITALS — BP 142/76 | HR 84 | Ht 65.0 in | Wt 198.4 lb

## 2013-07-06 DIAGNOSIS — I10 Essential (primary) hypertension: Secondary | ICD-10-CM

## 2013-07-06 DIAGNOSIS — I5022 Chronic systolic (congestive) heart failure: Secondary | ICD-10-CM

## 2013-07-06 LAB — BASIC METABOLIC PANEL
BUN: 10 mg/dL (ref 6–23)
CO2: 26 mEq/L (ref 19–32)
Chloride: 109 mEq/L (ref 96–112)
Creatinine, Ser: 0.9 mg/dL (ref 0.4–1.2)
Potassium: 3.4 mEq/L — ABNORMAL LOW (ref 3.5–5.1)

## 2013-07-06 NOTE — Progress Notes (Signed)
See lab notes from today. Increase BiDil to TID. Tereso Newcomer, PA-C   07/06/2013 5:16 PM

## 2013-07-07 ENCOUNTER — Telehealth: Payer: Self-pay | Admitting: *Deleted

## 2013-07-07 DIAGNOSIS — I509 Heart failure, unspecified: Secondary | ICD-10-CM

## 2013-07-07 MED ORDER — POTASSIUM CHLORIDE CRYS ER 20 MEQ PO TBCR: 20.0000 meq | EXTENDED_RELEASE_TABLET | Freq: Every day | ORAL | Status: DC

## 2013-07-07 MED ORDER — ISOSORB DINITRATE-HYDRALAZINE 20-37.5 MG PO TABS: ORAL_TABLET | ORAL | Status: DC

## 2013-07-07 NOTE — Telephone Encounter (Signed)
pt notified about lab results with verbal understanding to Plan of Care; start K+ 20 meq daily; increase Bidil 20-37.5 to TID, bmet, bnp 07/17/13 this was a better day for the pt to come; Rx sent in for K+ and refill on Bidil to [facility]

## 2013-07-17 ENCOUNTER — Other Ambulatory Visit (INDEPENDENT_AMBULATORY_CARE_PROVIDER_SITE_OTHER): Payer: Medicare Other

## 2013-07-17 ENCOUNTER — Telehealth: Payer: Self-pay | Admitting: *Deleted

## 2013-07-17 DIAGNOSIS — I10 Essential (primary) hypertension: Secondary | ICD-10-CM

## 2013-07-17 DIAGNOSIS — I509 Heart failure, unspecified: Secondary | ICD-10-CM

## 2013-07-17 DIAGNOSIS — R0609 Other forms of dyspnea: Secondary | ICD-10-CM

## 2013-07-17 LAB — BASIC METABOLIC PANEL
BUN: 19 mg/dL (ref 6–23)
CO2: 28 mEq/L (ref 19–32)
Creatinine, Ser: 1.3 mg/dL — ABNORMAL HIGH (ref 0.4–1.2)
GFR: 52.85 mL/min — ABNORMAL LOW (ref >60.00)
Glucose, Bld: 77 mg/dL (ref 70–99)
Potassium: 4.1 mEq/L (ref 3.5–5.1)

## 2013-07-17 LAB — BRAIN NATRIURETIC PEPTIDE: Pro B Natriuretic peptide (BNP): 37 pg/mL (ref 0.0–100.0)

## 2013-07-17 NOTE — Telephone Encounter (Signed)
pt notified about lab results and bmet 07/24/13 with verbal understanding

## 2013-07-24 ENCOUNTER — Other Ambulatory Visit (INDEPENDENT_AMBULATORY_CARE_PROVIDER_SITE_OTHER): Payer: Medicare Other

## 2013-07-24 DIAGNOSIS — I10 Essential (primary) hypertension: Secondary | ICD-10-CM

## 2013-07-24 LAB — BASIC METABOLIC PANEL
BUN: 24 mg/dL — ABNORMAL HIGH (ref 6–23)
CO2: 29 mEq/L (ref 19–32)
Calcium: 8.9 mg/dL (ref 8.4–10.5)
Chloride: 108 mEq/L (ref 96–112)
Creatinine, Ser: 1.3 mg/dL — ABNORMAL HIGH (ref 0.4–1.2)
Potassium: 4.3 mEq/L (ref 3.5–5.1)

## 2013-08-06 IMAGING — CR DG CHEST 1V PORT
1 series · 1 of 1 positions shown · non-contrast
Comparison: 09/14/2011

CLINICAL DATA: Shortness of breath, cough.

PORTABLE CHEST - 1 VIEW

[view not recorded]
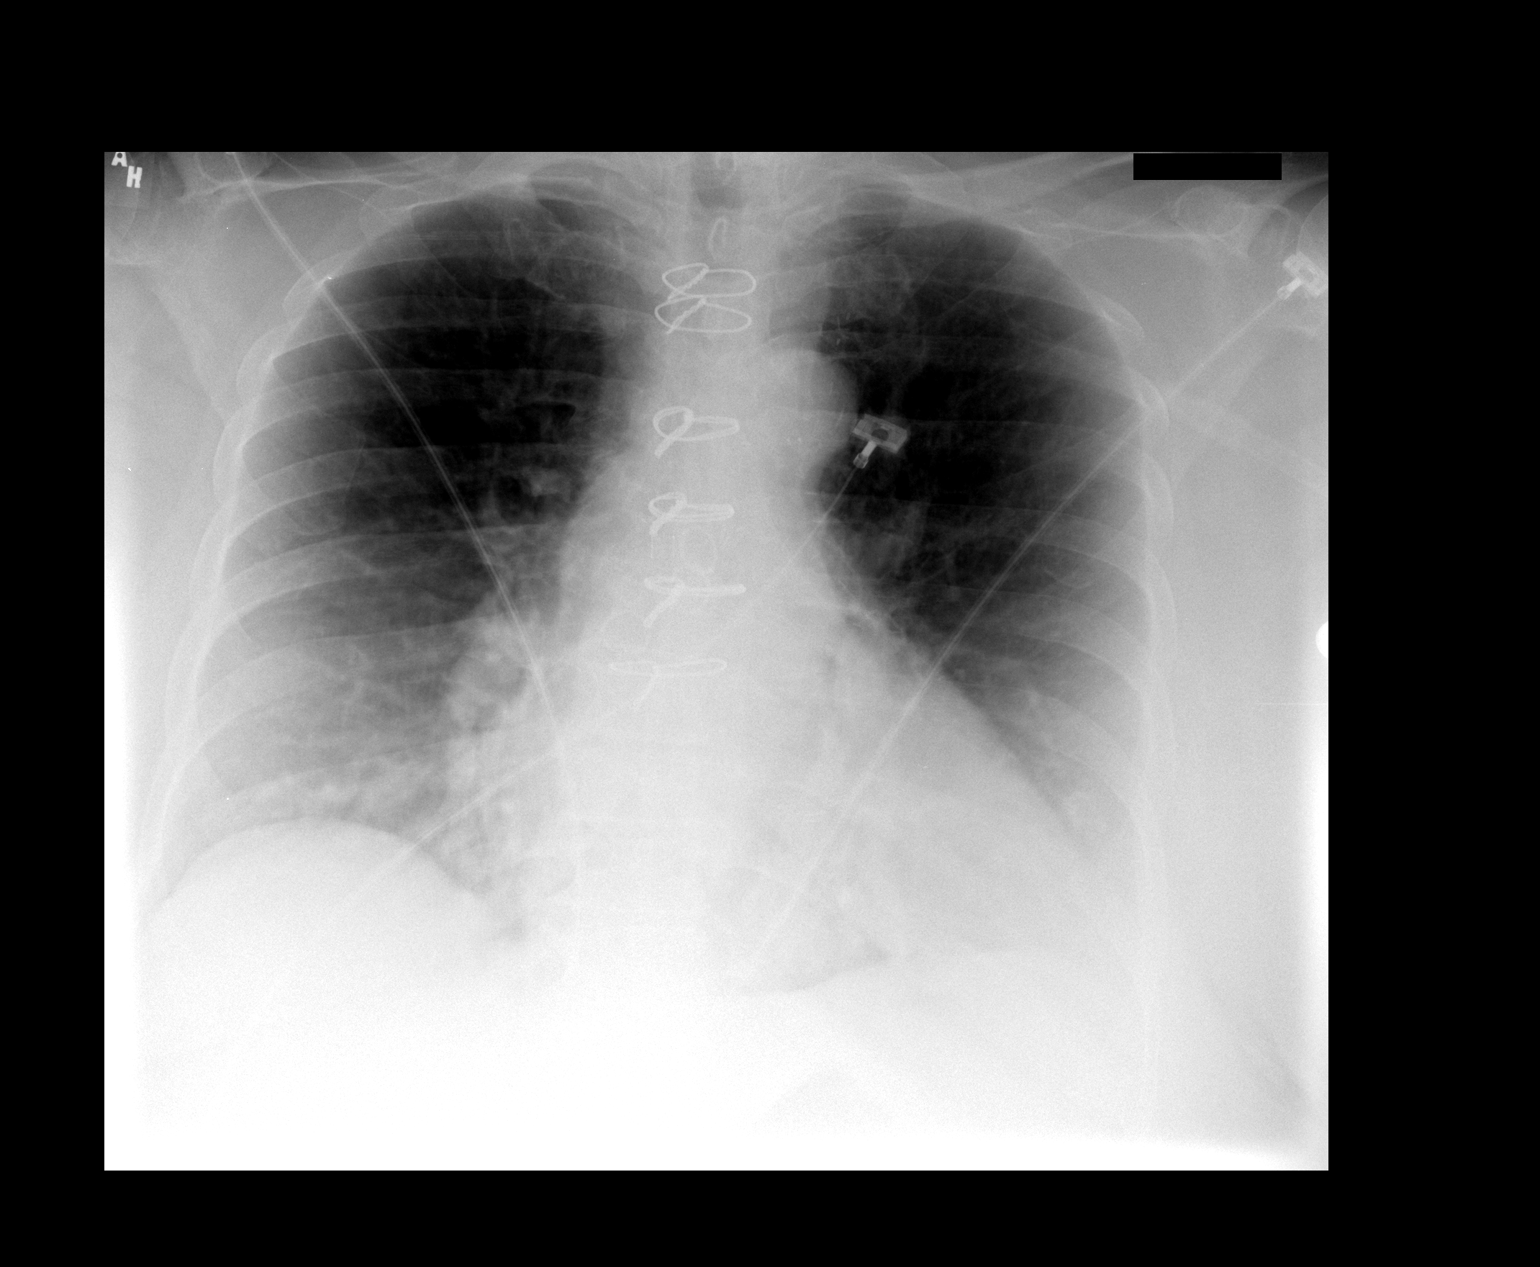

[1 of 1 positions shown; findings below may reference images not displayed]

FINDINGS: Prior CABG.  Heart is borderline in size.  Bibasilar
opacities, likely atelectasis.  No effusions or acute bony
abnormality.
IMPRESSION: Borderline heart size.  Prior CABG.  Bibasilar atelectasis.

## 2013-08-23 ENCOUNTER — Other Ambulatory Visit: Payer: Self-pay | Admitting: Physician Assistant

## 2013-09-26 ENCOUNTER — Ambulatory Visit (INDEPENDENT_AMBULATORY_CARE_PROVIDER_SITE_OTHER): Payer: Medicare Other | Admitting: Cardiovascular Disease

## 2013-09-26 ENCOUNTER — Encounter: Payer: Self-pay | Admitting: Cardiovascular Disease

## 2013-09-26 VITALS — BP 108/70 | HR 93 | Ht 65.0 in | Wt 196.8 lb

## 2013-09-26 DIAGNOSIS — I5022 Chronic systolic (congestive) heart failure: Secondary | ICD-10-CM

## 2013-09-26 MED ORDER — FUROSEMIDE 20 MG PO TABS: ORAL_TABLET | ORAL | Status: DC

## 2013-09-26 MED ORDER — LISINOPRIL 10 MG PO TABS: ORAL_TABLET | ORAL | Status: DC

## 2013-09-26 NOTE — Patient Instructions (Signed)
Your physician recommends that you schedule a follow-up appointment in: Tereso Newcomer, PA-C on 12/29 at 11:50 am.  You will also labs (BMET) at this time  Your physician has recommended you make the following change in your medication:  HOLD LISINOPRIL TOMORROW AND Thursday THEN TAKE 10 MG PER DAY INSTEAD OF 20 MG STOP LASIX  .

## 2013-09-26 NOTE — Progress Notes (Signed)
HPI:  72 year-old woman presenting for follow-up evaluation.  She's followed for chronic systolic heart failure, HTN, and CAD. The patient underwent multivessel CABG in 2009 after presenting with NSTEMI in the setting of respiratory failure. She has undergone EP evaluation for consideration of an ICD because of residual severe LV dysfunction. Ongoing medical therapy was recommended and her LVEF has improved on most recent echo. She is here alone today for scheduled follow-up.  She complains of dizziness and postural symptoms. Has not been feeling well of late. No chest pain, palpitations, or edema. Appetite has been normal. No fever, chills, or other complaints.   Outpatient Encounter Prescriptions as of 09/26/2013  Medication Sig  . albuterol (PROAIR HFA) 108 (90 BASE) MCG/ACT inhaler 1-2 puffs every 4-6 hours as needed  . albuterol (PROVENTIL) (5 MG/ML) 0.5% nebulizer solution Take 0.5 mLs (2.5 mg total) by nebulization every 2 (two) hours as needed for wheezing or shortness of breath.  . allopurinol (ZYLOPRIM) 100 MG tablet Take 100 mg by mouth daily.   [name] aspirin 81 MG tablet Take 81 mg by mouth daily.   [name] atorvastatin (LIPITOR) 80 MG tablet Take 1 tablet (80 mg total) by mouth daily.  . budesonide-formoterol (SYMBICORT) 160-4.5 MCG/ACT inhaler Inhale 2 puffs into the lungs 2 (two) times daily.  . carvedilol (COREG) 6.25 MG tablet Take 6.25 mg by mouth 2 (two) times daily with a meal. Take 1 tab by mouth 2 times a day  . colchicine 0.6 MG tablet Take 0.6 mg by mouth 2 (two) times daily as needed. For gout  . furosemide (LASIX) 20 MG tablet Take 1 tablet (20 mg total) by mouth daily.  [name] HYDROcodone-acetaminophen (VICODIN) 5-500 MG per tablet Take 1 tablet by mouth 2 (two) times daily.   [name] ipratropium (ATROVENT) 0.02 % nebulizer solution Take 2.5 mLs (0.5 mg total) by nebulization 4 (four) times daily as needed for wheezing.  . isosorbide-hydrALAZINE (BIDIL) 20-37.5 MG per tablet Take one  tablet by mouth THREE times daily  . lisinopril (PRINIVIL,ZESTRIL) 20 MG tablet Take 20 mg by mouth daily.  . Multiple Vitamin (MULTIVITAMIN) tablet Take 1 tablet by mouth daily.   . nitroGLYCERIN (NITROSTAT) 0.4 MG SL tablet Place 1 tablet (0.4 mg total) under the tongue every 5 (five) minutes as needed for chest pain.  [name] omeprazole (PRILOSEC) 10 MG capsule Take 10 mg by mouth daily.  . potassium chloride SA (K-DUR,KLOR-CON) 20 MEQ tablet Take 1 tablet (20 mEq total) by mouth daily.  [name] spironolactone (ALDACTONE) 25 MG tablet TAKE 1 TABLET DAILY  . tiotropium (SPIRIVA HANDIHALER) 18 MCG inhalation capsule Place 1 capsule (18 mcg total) into inhaler and inhale daily.  . VOLTAREN 1 % GEL 2 g as needed.   . [DISCONTINUED] lisinopril (PRINIVIL,ZESTRIL) 20 MG tablet Take 0.5 tablets (10 mg total) by mouth daily.  . [DISCONTINUED] spironolactone (ALDACTONE) 25 MG tablet Take 1 tablet (25 mg total) by mouth daily.    No Known Allergies  Past Medical History  Diagnosis Date  . Chronic systolic heart failure   . Other emphysema   . Shortness of breath   . HLD (hyperlipidemia)   . HTN (hypertension)   . CAD (coronary artery disease)     s/p NSTEMI 2009 - s/p CABG 2009: L-LAD, S-OM1 and OM2, S-Dx, S-PDA.;  Myoview 11/04/11:  Small inf scar, mod ant apical and septal scar, no ischemia, EF 31%.  . Cardiomyopathy, ischemic     a.  LVEF 35-40%;  b. Echo 5/14: Moderate LVH, EF 30-35%, mid to distal anteroseptal and apical HK  . Thrombocytopenia   . HIT (heparin-induced thrombocytopenia)    ROS: Negative except as per HPI  BP 110/60  Pulse 99  Ht 5\' 5"  (1.651 m)  Wt 196 lb 12.8 oz (89.268 kg)  BMI 32.75 kg/m2  SpO2 94%  PHYSICAL EXAM: Pt is alert and oriented, NAD, lightheaded sitting up on the exam table.  HEENT: normal Neck: JVP - normal Lungs: CTA bilaterally CV: RRR without murmur or gallop Abd: soft, NT, Positive BS, no hepatomegaly Ext: no C/C/E, distal pulses intact and equal Skin:  warm/dry no rash  2D Echo: 06/23/2013 Study Conclusions  - Left ventricle: The cavity size was normal. There was mild focal basal hypertrophy of the septum. Systolic function was moderately reduced. The estimated ejection fraction was in the range of 35% to 40%. There is akinesis of the mid-distalanteroseptal and apical myocardium. There is akinesis of the mid-distalinferior myocardium. Features are consistent with a pseudonormal left ventricular filling pattern, with concomitant abnormal relaxation and increased filling pressure (grade 2 diastolic dysfunction). - Mitral valve: Mild regurgitation. - Right ventricle: Systolic function was reduced. - Tricuspid valve: Moderate regurgitation. - Pulmonary arteries: Systolic pressure was moderately increased. PA peak pressure: 47mm Hg (S).  ASSESSMENT AND PLAN: 1. Lightheadedness - postural. Likely related to hypotension. Will decrease lisinopril to 10 mg daily after holding it for 48 hours. Stop lasix. Otherwise continue current medications. Increase fluid intake.  Orthostatic vitals checked today and reviewed.   2. CAD s/p CABG  No signs of ongoing ischemia.  3. HTN - as above, she may be overmedicated. Continue current Rx's with changes as outlined.   Tonny Bollman 09/27/2013 1:32 PM

## 2013-10-16 ENCOUNTER — Ambulatory Visit (INDEPENDENT_AMBULATORY_CARE_PROVIDER_SITE_OTHER): Payer: Medicare Other | Admitting: Physician Assistant

## 2013-10-16 ENCOUNTER — Other Ambulatory Visit: Payer: Medicare Other | Admitting: *Deleted

## 2013-10-16 ENCOUNTER — Encounter: Payer: Self-pay | Admitting: Physician Assistant

## 2013-10-16 VITALS — BP 130/76 | HR 75 | Ht 65.0 in | Wt 200.8 lb

## 2013-10-16 DIAGNOSIS — I5022 Chronic systolic (congestive) heart failure: Secondary | ICD-10-CM

## 2013-10-16 DIAGNOSIS — I255 Ischemic cardiomyopathy: Secondary | ICD-10-CM

## 2013-10-16 DIAGNOSIS — E785 Hyperlipidemia, unspecified: Secondary | ICD-10-CM

## 2013-10-16 DIAGNOSIS — I2510 Atherosclerotic heart disease of native coronary artery without angina pectoris: Secondary | ICD-10-CM

## 2013-10-16 DIAGNOSIS — I10 Essential (primary) hypertension: Secondary | ICD-10-CM

## 2013-10-16 DIAGNOSIS — I509 Heart failure, unspecified: Secondary | ICD-10-CM

## 2013-10-16 DIAGNOSIS — I2589 Other forms of chronic ischemic heart disease: Secondary | ICD-10-CM

## 2013-10-16 LAB — BASIC METABOLIC PANEL
BUN: 9 mg/dL (ref 6–23)
CO2: 26 mEq/L (ref 19–32)
Calcium: 8.6 mg/dL (ref 8.4–10.5)
Creatinine, Ser: 1.1 mg/dL (ref 0.4–1.2)
GFR: 60.43 mL/min (ref >60.00)
Glucose, Bld: 134 mg/dL — ABNORMAL HIGH (ref 70–99)
Sodium: 143 mEq/L (ref 135–145)

## 2013-10-16 MED ORDER — ISOSORB DINITRATE-HYDRALAZINE 20-37.5 MG PO TABS: ORAL_TABLET | ORAL | Status: DC

## 2013-10-16 NOTE — Patient Instructions (Addendum)
Your physician recommends that you schedule a follow-up appointment in: 3 MONTHS WITH [facility]  Your physician recommends that you schedule a follow-up appointment in: 6 MONTHS WITH DR. Excell Seltzer  YOUR PROVIDER WOULD LIKE FOR YOU TO START TAKING YOUR LISINOPRIL AT BEDTIME  Weigh yourself every day in the morning before eating or getting dressed. If your weight goes up 3 lbs in one day or 5 lbs in one week, take Lasix 20 mg that day and call us.

## 2013-10-16 NOTE — Progress Notes (Signed)
[address], [address] [place], [place]  [address] Phone: [phone] Fax:  [phone]  Date:  10/16/2013   ID:  [name], DOB [birth year], MRN [id number]  PCP:  Dorrene German, MD  Primary Cardiologist:  Dr. Tonny Bollman  Electrophysiologist:  Dr. Sherryl Manges    History of Present Illness: [name] is a 72 y.o. female with a hx of CAD, s/p CABG in 2009, ICM, systolic CHF, HTN, HLP, COPD, chronic dyspnea, thrombocytopenia. 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 She initially presented with an NSTEMI. Subsequent bypass included a L-LAD, S-OM1 and OM2, S-Dx, S-PDA.  She was admitted in 08/2011 with community acquired pneumonia versus COPD exacerbation.  Myoview 11/04/11:  Small inf scar, mod ant apical and septal scar, no ischemia, EF 31%.  Med Rx continued.  Follow up echocardiogram 5/14: Moderate LVH, EF 30-35%, anteroseptal and apical HK. She was set up to see Dr. Graciela Husbands for consideration of ICD implantation. Recommendation was to further titrate medical Rx first.  Follow up echo 06/23/13:  Mild focal basal septal hypertrophy, EF 35-40%, anteroseptal and apical HK, grade 2 diastolic dysfunction, mild MR, reduced RV systolic function, moderate TR, PASP 47.  Therefore, she does not need ICD.    Last seen by Dr. Tonny Bollman 09/26/2013.  Her BP was running low and it was felt she was symptomatic.  Her medications were adjusted (Lasix was stopped and her Lisinopril was decreased to 10 QD).    Since last seen, she is much less dizzy.  She felt like her dizziness completely resolved off of the Lisinopril.  She denies syncope or near syncope.  She has chronic DOE.  This is unchanged. She is NYHA Class 2b-3.  She denies orthopnea, PND, edema.  She denies chest pain.    Recent Labs: 05/25/2013: Hemoglobin 13.4  07/17/2013: Pro B Natriuretic peptide (BNP) 37.0  07/24/2013: Creatinine 1.3*; Potassium 4.3   Wt Readings from Last 3 Encounters:    10/16/13 200 lb 12.8 oz (91.082 kg)  09/26/13 196 lb 12.8 oz (89.268 kg)  07/06/13 198 lb 6.4 oz (89.994 kg)     Past Medical History  Diagnosis Date  . Chronic systolic heart failure   . Other emphysema   . Shortness of breath   . HLD (hyperlipidemia)   . HTN (hypertension)   . CAD (coronary artery disease)     s/p NSTEMI 2009 - s/p CABG 2009: L-LAD, S-OM1 and OM2, S-Dx, S-PDA.;  Myoview 11/04/11:  Small inf scar, mod ant apical and septal scar, no ischemia, EF 31%.  . Cardiomyopathy, ischemic     a.  LVEF 35-40%;  b. Echo 5/14: Moderate LVH, EF 30-35%, mid to distal anteroseptal and apical HK  . Thrombocytopenia   . HIT (heparin-induced thrombocytopenia)     Current Outpatient Prescriptions  Medication Sig Dispense Refill  . albuterol (PROAIR HFA) 108 (90 BASE) MCG/ACT inhaler 1-2 puffs every 4-6 hours as needed  1 Inhaler  3  . allopurinol (ZYLOPRIM) 100 MG tablet Take 100 mg by mouth daily.       [name] aspirin 81 MG tablet Take 81 mg by mouth daily.       [name] atorvastatin (LIPITOR) 80 MG tablet Take 1 tablet (80 mg total) by mouth daily.  30 tablet  6  . carvedilol (COREG) 6.25 MG tablet Take 6.25 mg by mouth 2 (two) times daily with a meal. Take 1 tab by mouth 2 times a day      .  colchicine 0.6 MG tablet Take 0.6 mg by mouth 2 (two) times daily as needed. For gout      . furosemide (LASIX) 20 MG tablet ON HOLD BEGIN 12/9  30 tablet  5  . HYDROcodone-acetaminophen (VICODIN) 5-500 MG per tablet Take 1 tablet by mouth 2 (two) times daily.       . isosorbide-hydrALAZINE (BIDIL) 20-37.5 MG per tablet Take one tablet by mouth THREE times daily  90 tablet  11  . lisinopril (PRINIVIL,ZESTRIL) 10 MG tablet Take 1 tablet (10 mg) every day  90 tablet  3  . Multiple Vitamin (MULTIVITAMIN) tablet Take 1 tablet by mouth daily.       [name] omeprazole (PRILOSEC) 10 MG capsule Take 10 mg by mouth daily.      . potassium chloride SA (K-DUR,KLOR-CON) 20 MEQ tablet Take 1 tablet (20 mEq total) by mouth  daily.  30 tablet  11  . spironolactone (ALDACTONE) 25 MG tablet TAKE 1 TABLET DAILY  30 tablet  5  . VOLTAREN 1 % GEL 2 g as needed.       [name] albuterol (PROVENTIL) (5 MG/ML) 0.5% nebulizer solution Take 0.5 mLs (2.5 mg total) by nebulization every 2 (two) hours as needed for wheezing or shortness of breath.  20 mL  0  . budesonide-formoterol (SYMBICORT) 160-4.5 MCG/ACT inhaler Inhale 2 puffs into the lungs 2 (two) times daily.  3 Inhaler  3  . ipratropium (ATROVENT) 0.02 % nebulizer solution Take 2.5 mLs (0.5 mg total) by nebulization 4 (four) times daily as needed for wheezing.  75 mL  0  . nitroGLYCERIN (NITROSTAT) 0.4 MG SL tablet Place 1 tablet (0.4 mg total) under the tongue every 5 (five) minutes as needed for chest pain.  25 tablet  12  . tiotropium (SPIRIVA HANDIHALER) 18 MCG inhalation capsule Place 1 capsule (18 mcg total) into inhaler and inhale daily.  90 capsule  3   No current facility-administered medications for this visit.    Allergies:   No Known Allergies  Social History:  The patient  reports that she quit smoking about 5 years ago. Her smoking use included Cigarettes. She has a 44 pack-year smoking history. She does not have any smokeless tobacco history on file. She reports that she does not drink alcohol or use illicit drugs.   ROS:  Please see the history of present illness.    All other systems reviewed and negative.   PHYSICAL EXAM: VS:  BP 130/76  Pulse 75  Ht 5\' 5"  (1.651 m)  Wt 200 lb 12.8 oz (91.082 kg)  BMI 33.41 kg/m2 Well nourished, well developed, in no acute distress HEENT: normal Neck: mildly elevated JVD Cardiac:  normal S1, S2; RRR; no murmur Lungs:  Decreased breath sounds bilaterally, no wheezing, rhonchi or rales Abd: soft, nontender, no hepatomegaly Ext: no edema Skin: warm and dry Neuro:  CNs 2-12 intact, no focal abnormalities noted  EKG:   NSR, HR 75, LAD, PRWP, NSSTTW changes, no change from prior tracing    ASSESSMENT AND  PLAN:  1. CAD:  No angina.  Continue aspirin and statin. 2. Chronic Systolic CHF:  Volume stable.  I have advised her to weigh daily. She knows when to take Lasix PRN.  Check BMET today as planned. 3. Ischemic Cardiomyopathy:  Continue ACEI, beta blocker, hydralazine, nitrates, spironolactone.  EF was > 35% by last assessment. 4. Hypertension:  Controlled.  BP looks better. I have suggested she take Lisinopril at HS to see if  this eliminates her dizziness.   5. Hyperlipidemia:  Continue statin. 6. Disposition: Follow up with me in 3 mos and Dr. Tonny Bollman in 6 mos.  SignedTereso Newcomer, PA-C  11:54 AM 10/16/2013

## 2013-10-17 ENCOUNTER — Other Ambulatory Visit: Payer: Self-pay | Admitting: Internal Medicine

## 2013-10-17 NOTE — Addendum Note (Signed)
Addended by: Awilda Bill on: 10/17/2013 08:40 AM   Modules accepted: Orders

## 2013-10-17 NOTE — Addendum Note (Signed)
Addended by: Awilda Bill on: 10/17/2013 08:43 AM   Modules accepted: Orders

## 2013-10-28 ENCOUNTER — Other Ambulatory Visit: Payer: Self-pay | Admitting: Internal Medicine

## 2013-11-02 ENCOUNTER — Encounter (HOSPITAL_COMMUNITY): Payer: Self-pay | Admitting: Emergency Medicine

## 2013-11-02 ENCOUNTER — Emergency Department (HOSPITAL_COMMUNITY)
Admission: EM | Admit: 2013-11-02 | Discharge: 2013-11-02 | Disposition: A | Discharge status: Home / Self Care | Payer: Medicare Other | Attending: Emergency Medicine | Admitting: Emergency Medicine

## 2013-11-02 ENCOUNTER — Emergency Department (HOSPITAL_COMMUNITY): Payer: Medicare Other

## 2013-11-02 DIAGNOSIS — J438 Other emphysema: Secondary | ICD-10-CM | POA: Insufficient documentation

## 2013-11-02 DIAGNOSIS — E785 Hyperlipidemia, unspecified: Secondary | ICD-10-CM | POA: Insufficient documentation

## 2013-11-02 DIAGNOSIS — Z862 Personal history of diseases of the blood and blood-forming organs and certain disorders involving the immune mechanism: Secondary | ICD-10-CM | POA: Insufficient documentation

## 2013-11-02 DIAGNOSIS — I5022 Chronic systolic (congestive) heart failure: Secondary | ICD-10-CM | POA: Insufficient documentation

## 2013-11-02 DIAGNOSIS — Z79899 Other long term (current) drug therapy: Secondary | ICD-10-CM | POA: Insufficient documentation

## 2013-11-02 DIAGNOSIS — I2510 Atherosclerotic heart disease of native coronary artery without angina pectoris: Secondary | ICD-10-CM | POA: Insufficient documentation

## 2013-11-02 DIAGNOSIS — Z87891 Personal history of nicotine dependence: Secondary | ICD-10-CM | POA: Insufficient documentation

## 2013-11-02 DIAGNOSIS — I10 Essential (primary) hypertension: Secondary | ICD-10-CM | POA: Insufficient documentation

## 2013-11-02 DIAGNOSIS — J029 Acute pharyngitis, unspecified: Secondary | ICD-10-CM | POA: Insufficient documentation

## 2013-11-02 DIAGNOSIS — Z951 Presence of aortocoronary bypass graft: Secondary | ICD-10-CM | POA: Insufficient documentation

## 2013-11-02 DIAGNOSIS — Z7982 Long term (current) use of aspirin: Secondary | ICD-10-CM | POA: Insufficient documentation

## 2013-11-02 DIAGNOSIS — H9209 Otalgia, unspecified ear: Secondary | ICD-10-CM | POA: Insufficient documentation

## 2013-11-02 MED ORDER — ALBUTEROL SULFATE (2.5 MG/3ML) 0.083% IN NEBU
INHALATION_SOLUTION | RESPIRATORY_TRACT | Status: AC
  Filled 2013-11-02: qty 6

## 2013-11-02 MED ORDER — ALBUTEROL SULFATE (2.5 MG/3ML) 0.083% IN NEBU
5.0000 mg | INHALATION_SOLUTION | Freq: Once | RESPIRATORY_TRACT | Status: AC
  Administered 2013-11-02: 5 mg via RESPIRATORY_TRACT
  Filled 2013-11-02: qty 6

## 2013-11-02 MED ORDER — IPRATROPIUM BROMIDE 0.02 % IN SOLN
0.5000 mg | Freq: Once | RESPIRATORY_TRACT | Status: AC
Start: 2013-11-02 — End: 2013-11-02
  Administered 2013-11-02: 0.5 mg via RESPIRATORY_TRACT
  Filled 2013-11-02: qty 2.5

## 2013-11-02 MED ORDER — PREDNISONE 10 MG PO TABS: 20.0000 mg | ORAL_TABLET | Freq: Every day | ORAL | Status: DC

## 2013-11-02 MED ORDER — AZITHROMYCIN 250 MG PO TABS: ORAL_TABLET | ORAL | Status: DC

## 2013-11-02 MED ORDER — HYDROCODONE-ACETAMINOPHEN 5-325 MG PO TABS: 2.0000 | ORAL_TABLET | ORAL | Status: DC | PRN

## 2013-11-02 NOTE — ED Notes (Signed)
Pt c/o nasal congestion that has "moved to her chest" x 3 days and cough w/ chest burning/tightness x 1 day.  Pain score 7/10.  Pt's family has been sick.

## 2013-11-02 NOTE — Discharge Instructions (Signed)
Chronic Obstructive Pulmonary Disease  Chronic obstructive pulmonary disease (COPD) is a common lung condition in which airflow from the lungs is limited. COPD is a general term that can be used to describe many different lung problems that limit airflow, including both chronic bronchitis and emphysema.  If you have COPD, your lung function will probably never return to normal, but there are measures you can take to improve lung function and make yourself feel better.   CAUSES   · Smoking (common).    · Exposure to secondhand smoke.    · Genetic problems.  · Chronic inflammatory lung diseases or recurrent infections.  SYMPTOMS   · Shortness of breath, especially with physical activity.    · Deep, persistent (chronic) cough with a large amount of thick mucus.    · Wheezing.    · Rapid breaths (tachypnea).    · Gray or bluish discoloration (cyanosis) of the skin, especially in fingers, toes, or lips.    · Fatigue.    · Weight loss.    · Frequent infections or episodes when breathing symptoms become much worse (exacerbations).    · Chest tightness.  DIAGNOSIS   Your healthcare provider will take a medical history and perform a physical examination to make the initial diagnosis.  Additional tests for COPD may include:   · Lung (pulmonary) function tests.  · Chest X-ray.  · CT scan.  · Blood tests.  TREATMENT   Treatment available to help you feel better when you have COPD include:   · Inhaler and nebulizer medicines. These help manage the symptoms of COPD and make your breathing more comfortable  · Supplemental oxygen. Supplemental oxygen is only helpful if you have a low oxygen level in your blood.    · Exercise and physical activity. These are beneficial for nearly all people with COPD. Some people may also benefit from a pulmonary rehabilitation program.  HOME CARE INSTRUCTIONS   · Take all medicines (inhaled or pills) as directed by your health care provider.  · Only take over-the-counter or prescription medicines  for pain, fever, or discomfort as directed by your health care provider.    · Avoid over-the-counter medicines or cough syrups that dry up your airway (such as antihistamines) and slow down the elimination of secretions unless instructed otherwise by your healthcare provider.    · If you are a smoker, the most important thing that you can do is stop smoking. Continuing to smoke will cause further lung damage and breathing trouble. Ask your health care provider for help with quitting smoking. He or she can direct you to community resources or hospitals that provide support.  · Avoid exposure to irritants such as smoke, chemicals, and fumes that aggravate your breathing.  · Use oxygen therapy and pulmonary rehabilitation if directed by your health care provider. If you require home oxygen therapy, ask your healthcare provider whether you should purchase a pulse oximeter to measure your oxygen level at home.    · Avoid contact with individuals who have a contagious illness.  · Avoid extreme temperature and humidity changes.  · Eat healthy foods. Eating smaller, more frequent meals and resting before meals may help you maintain your strength.  · Stay active, but balance activity with periods of rest. Exercise and physical activity will help you maintain your ability to do things you want to do.  · Preventing infection and hospitalization is very important when you have COPD. Make sure to receive all the vaccines your health care provider recommends, especially the pneumococcal and influenza vaccines. Ask your healthcare provider whether you   need a pneumonia vaccine.  · Learn and use relaxation techniques to manage stress.  · Learn and use controlled breathing techniques as directed by your health care provider. Controlled breathing techniques include:    · Pursed lip breathing. Start by breathing in (inhaling) through your nose for 1 second. Then, purse your lips as if you were going to whistle and breathe out (exhale)  through the pursed lips for 2 seconds.    · Diaphragmatic breathing. Start by putting one hand on your abdomen just above your waist. Inhale slowly through your nose. The hand on your abdomen should move out. Then purse your lips and exhale slowly. You should be able to feel the hand on your abdomen moving in as you exhale.    · Learn and use controlled coughing to clear mucus from your lungs. Controlled coughing is a series of short, progressive coughs. The steps of controlled coughing are:    1. Lean your head slightly forward.    2. Breathe in deeply using diaphragmatic breathing.    3. Try to hold your breath for 3 seconds.    4. Keep your mouth slightly open while coughing twice.    5. Spit any mucus out into a tissue.    6. Rest and repeat the steps once or twice as needed.  SEEK MEDICAL CARE IF:   · You are coughing up more mucus than usual.    · There is a change in the color or thickness of your mucus.    · Your breathing is more labored than usual.    · Your breathing is faster than usual.    SEEK IMMEDIATE MEDICAL CARE IF:   · You have shortness of breath while you are resting.    · You have shortness of breath that prevents you from:  · Being able to talk.    · Performing your usual physical activities.    · You have chest pain lasting longer than 5 minutes.    · Your skin color is more cyanotic than usual.  · You measure low oxygen saturations for longer than 5 minutes with a pulse oximeter.  MAKE SURE YOU:   · Understand these instructions.  · Will watch your condition.  · Will get help right away if you are not doing well or get worse.  Document Released: 07/15/2005 Document Revised: 07/26/2013 Document Reviewed: 06/01/2013  [employer]® Patient Information ©2014 [employer].

## 2013-11-02 NOTE — ED Provider Notes (Signed)
CSN: [id number]     Arrival date & time 11/02/13  1505 History   First MD Initiated Contact with Patient 11/02/13 1531     Chief Complaint  Patient presents with  . Nasal Congestion  . Cough   (Consider location/radiation/quality/duration/timing/severity/associated sxs/prior Treatment) Patient is a 73 y.o. female presenting with cough. The history is provided by the patient.  Cough  patient here with cough and congestion x3 days . Denies any fever or chills. No anginal type chest pain. Has used over-the-counter medications without relief. Mild so throat and ear pain. Does have a history of CHF and this is not similar. Denies any peripheral edema. No orthopnea or dyspnea on exertion. Does note some right-sided chest discomfort worse with coughing.  Past Medical History  Diagnosis Date  . Chronic systolic heart failure   . Other emphysema   . Shortness of breath   . HLD (hyperlipidemia)   . HTN (hypertension)   . CAD (coronary artery disease)     s/p NSTEMI 2009 - s/p CABG 2009: L-LAD, S-OM1 and OM2, S-Dx, S-PDA.;  Myoview 11/04/11:  Small inf scar, mod ant apical and septal scar, no ischemia, EF 31%.  . Cardiomyopathy, ischemic     a.  LVEF 35-40%;  b. Echo 5/14: Moderate LVH, EF 30-35%, mid to distal anteroseptal and apical HK  . Thrombocytopenia   . HIT (heparin-induced thrombocytopenia)    Past Surgical History  Procedure Laterality Date  . Coronary artery bypass graft  November 21, 2007    5 vessel   Family History  Problem Relation Age of Onset  . Asthma Mother   . Hypertension Mother   . Diabetes Mother   . Rheum arthritis Mother    History  Substance Use Topics  . Smoking status: Former Smoker -- 1.00 packs/day for 44 years    Types: Cigarettes    Quit date: 10/20/2007  . Smokeless tobacco: Not on file     Comment: started at age 82s.   . Alcohol Use: No   OB History   Grav Para Term Preterm Abortions TAB SAB Ect Mult Living                 Review of Systems   Respiratory: Positive for cough.   All other systems reviewed and are negative.    Allergies  Review of patient's allergies indicates no known allergies.  Home Medications   Current Outpatient Rx  Name  Route  Sig  Dispense  Refill  . albuterol (PROAIR HFA) 108 (90 BASE) MCG/ACT inhaler      1-2 puffs every 4-6 hours as needed   1 Inhaler   3   . albuterol (PROVENTIL) (5 MG/ML) 0.5% nebulizer solution   Nebulization   Take 0.5 mLs (2.5 mg total) by nebulization every 2 (two) hours as needed for wheezing or shortness of breath.   20 mL   0   . allopurinol (ZYLOPRIM) 100 MG tablet   Oral   Take 100 mg by mouth daily.          [name] aspirin 81 MG tablet   Oral   Take 81 mg by mouth daily.          [name] atorvastatin (LIPITOR) 80 MG tablet      TAKE 1 BY MOUTH EVERY DAY   30 tablet   0   . budesonide-formoterol (SYMBICORT) 160-4.5 MCG/ACT inhaler   Inhalation   Inhale 2 puffs into the lungs 2 (two) times daily.  3 Inhaler   3     PATIENT REQUEST 90 DAY SUPPLY   . carvedilol (COREG) 6.25 MG tablet   Oral   Take 6.25 mg by mouth 2 (two) times daily with a meal. Take 1 tab by mouth 2 times a day         . colchicine 0.6 MG tablet   Oral   Take 0.6 mg by mouth 2 (two) times daily as needed. For gout         . ipratropium (ATROVENT) 0.02 % nebulizer solution   Nebulization   Take 2.5 mLs (0.5 mg total) by nebulization 4 (four) times daily as needed for wheezing.   75 mL   0   . isosorbide-hydrALAZINE (BIDIL) 20-37.5 MG per tablet      Take one tablet by mouth THREE times daily   90 tablet   11   . lisinopril (PRINIVIL,ZESTRIL) 10 MG tablet      Take 1 tablet (10 mg) every day   90 tablet   3   . Multiple Vitamin (MULTIVITAMIN) tablet   Oral   Take 1 tablet by mouth daily.          [name] omeprazole (PRILOSEC) 10 MG capsule   Oral   Take 10 mg by mouth daily.         . potassium chloride SA (K-DUR,KLOR-CON) 20 MEQ tablet   Oral   Take 1 tablet  (20 mEq total) by mouth daily.   30 tablet   11   . spironolactone (ALDACTONE) 25 MG tablet      25 mg. TAKE 1 TABLET DAILY         . tiotropium (SPIRIVA HANDIHALER) 18 MCG inhalation capsule   Inhalation   Place 1 capsule (18 mcg total) into inhaler and inhale daily.   90 capsule   3     PATIENT REQUEST 90 DAY SUPPLY   . VOLTAREN 1 % GEL      2 g as needed (pain).          . EXPIRED: nitroGLYCERIN (NITROSTAT) 0.4 MG SL tablet   Sublingual   Place 1 tablet (0.4 mg total) under the tongue every 5 (five) minutes as needed for chest pain.   25 tablet   12    BP 159/71  Pulse 94  Temp(Src) 98.7 F (37.1 C) (Oral)  Resp 20  SpO2 95% Physical Exam  Nursing note and vitals reviewed. Constitutional: She is oriented to person, place, and time. She appears well-developed and well-nourished.  Non-toxic appearance. No distress.  HENT:  Head: Normocephalic and atraumatic.  Eyes: Conjunctivae, EOM and lids are normal. Pupils are equal, round, and reactive to light.  Neck: Normal range of motion. Neck supple. No tracheal deviation present. No mass present.  Cardiovascular: Normal rate, regular rhythm and normal heart sounds.  Exam reveals no gallop.   No murmur heard. Pulmonary/Chest: Effort normal. No stridor. No respiratory distress. She has decreased breath sounds. She has wheezes. She has no rhonchi. She has no rales.  Abdominal: Soft. Normal appearance and bowel sounds are normal. She exhibits no distension. There is no tenderness. There is no rebound and no CVA tenderness.  Musculoskeletal: Normal range of motion. She exhibits no edema and no tenderness.  Neurological: She is alert and oriented to person, place, and time. She has normal strength. No cranial nerve deficit or sensory deficit. GCS eye subscore is 4. GCS verbal subscore is 5. GCS motor subscore is 6.  Skin:  Skin is warm and dry. No abrasion and no rash noted.  Psychiatric: She has a normal mood and affect. Her  speech is normal and behavior is normal.    ED Course  Procedures (including critical care time) Labs Review Labs Reviewed - No data to display Imaging Review No results found.  EKG Interpretation    Date/Time:  Thursday November 02 2013 15:21:16 EST Ventricular Rate:  86 PR Interval:  196 QRS Duration: 112 QT Interval:  385 QTC Calculation: 460 R Axis:   -77 Text Interpretation:  Sinus rhythm Probable left atrial enlargement LVH with secondary repolarization abnormality Inferior infarct, old Anterior infarct, old Baseline wander in lead(s) I II aVR Confirmed by Demir Titsworth  MD, Earla Charlie ([id number]) on 11/02/2013 3:52:45 PM            MDM  No diagnosis found.  Patient given treatment for likely COPD exacerbation here. Placed on a Z-Pak, prednisone. She has albuterol nebulizer at home and will take that as needed for breathing.   Toy BakerAnthony T Saket Hellstrom, MD 11/02/13 [phone]

## 2013-11-28 ENCOUNTER — Other Ambulatory Visit: Payer: Self-pay | Admitting: Internal Medicine

## 2013-12-28 ENCOUNTER — Other Ambulatory Visit: Payer: Self-pay | Admitting: Internal Medicine

## 2014-01-15 ENCOUNTER — Encounter: Payer: Self-pay | Admitting: Physician Assistant

## 2014-01-15 ENCOUNTER — Ambulatory Visit (INDEPENDENT_AMBULATORY_CARE_PROVIDER_SITE_OTHER): Payer: Medicare Other | Admitting: Physician Assistant

## 2014-01-15 VITALS — BP 142/82 | HR 80 | Ht 65.0 in | Wt 198.4 lb

## 2014-01-15 DIAGNOSIS — I255 Ischemic cardiomyopathy: Secondary | ICD-10-CM

## 2014-01-15 DIAGNOSIS — I2510 Atherosclerotic heart disease of native coronary artery without angina pectoris: Secondary | ICD-10-CM

## 2014-01-15 DIAGNOSIS — I10 Essential (primary) hypertension: Secondary | ICD-10-CM

## 2014-01-15 DIAGNOSIS — E785 Hyperlipidemia, unspecified: Secondary | ICD-10-CM

## 2014-01-15 DIAGNOSIS — I5022 Chronic systolic (congestive) heart failure: Secondary | ICD-10-CM

## 2014-01-15 DIAGNOSIS — I2589 Other forms of chronic ischemic heart disease: Secondary | ICD-10-CM

## 2014-01-15 MED ORDER — CARVEDILOL 6.25 MG PO TABS: 6.2500 mg | ORAL_TABLET | Freq: Two times a day (BID) | ORAL | Status: DC

## 2014-01-15 MED ORDER — OMEPRAZOLE 10 MG PO CPDR: 10.0000 mg | DELAYED_RELEASE_CAPSULE | Freq: Every day | ORAL | Status: DC

## 2014-01-15 MED ORDER — SPIRONOLACTONE 25 MG PO TABS: 25.0000 mg | ORAL_TABLET | Freq: Every day | ORAL | Status: DC

## 2014-01-15 MED ORDER — LISINOPRIL 10 MG PO TABS: ORAL_TABLET | ORAL | Status: DC

## 2014-01-15 MED ORDER — ATORVASTATIN CALCIUM 80 MG PO TABS: ORAL_TABLET | ORAL | Status: DC

## 2014-01-15 NOTE — Progress Notes (Signed)
[address] [place], [place]  [address] Phone: [phone] Fax:  [phone]  Date:  01/15/2014   ID:  [name], DOB 02-17-1941, MRN [id number]  PCP:  Dorrene German, MD  Primary Cardiologist:  Dr. Tonny Bollman  Electrophysiologist:  Dr. Sherryl Manges    History of Present Illness: [name] is a 73 y.o. female with a hx of CAD, s/p CABG in 2009, ICM, systolic CHF, HTN, HLP, COPD, chronic dyspnea, thrombocytopenia. 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 She initially presented with an NSTEMI. Subsequent bypass included a L-LAD, S-OM1 and OM2, S-Dx, S-PDA.  She was admitted in 08/2011 with community acquired pneumonia versus COPD exacerbation.  EF remained < 35% and she was set up to see Dr. Graciela Husbands for consideration of ICD implantation. Recommendation was to further titrate medical Rx first.  EF improved to 35-40% and no ICD was felt to be necessary.    Last seen by me in 09/2013.  She was seen in the ED in 10/2013 with a COPD exacerbation.  She is, overall, doing well. She describes chronic dyspnea on exertion. There has been no change. She is NYHA class 2b-3. She denies orthopnea, PND or edema. She denies chest discomfort. She denies syncope.   Studies:  - Myoview (11/04/11):  Small inf scar, mod ant apical and septal scar, no ischemia, EF 31%.  Med Rx continued.    - Echocardiogram (5/14): Moderate LVH, EF 30-35%, anteroseptal and apical HK.   - Echo (06/23/13):  Mild focal basal septal hypertrophy, EF 35-40%, anteroseptal and apical HK, grade 2 diastolic dysfunction, mild MR, reduced RV systolic function, moderate TR, PASP 47.       Recent Labs: 05/25/2013: Hemoglobin 13.4  07/17/2013: Pro B Natriuretic peptide (BNP) 37.0  10/16/2013: Creatinine 1.1; Potassium 3.6   Wt Readings from Last 3 Encounters:  01/15/14 198 lb 6.4 oz (89.994 kg)  10/16/13 200 lb 12.8 oz (91.082 kg)  09/26/13 196 lb 12.8 oz (89.268 kg)      Past Medical History  Diagnosis Date  . Chronic systolic heart failure   . Other emphysema   . Shortness of breath   . HLD (hyperlipidemia)   . HTN (hypertension)   . CAD (coronary artery disease)     s/p NSTEMI 2009 - s/p CABG 2009: L-LAD, S-OM1 and OM2, S-Dx, S-PDA.;  Myoview 11/04/11:  Small inf scar, mod ant apical and septal scar, no ischemia, EF 31%.  . Cardiomyopathy, ischemic     a.  LVEF 35-40%;  b. Echo 5/14: Moderate LVH, EF 30-35%, mid to distal anteroseptal and apical HK  . Thrombocytopenia   . HIT (heparin-induced thrombocytopenia)     Current Outpatient Prescriptions  Medication Sig Dispense Refill  . acetaminophen-codeine (TYLENOL #3) 300-30 MG per tablet       . albuterol (PROAIR HFA) 108 (90 BASE) MCG/ACT inhaler 1-2 puffs every 4-6 hours as needed  1 Inhaler  3  . albuterol (PROVENTIL) (5 MG/ML) 0.5% nebulizer solution Take 0.5 mLs (2.5 mg total) by nebulization every 2 (two) hours as needed for wheezing or shortness of breath.  20 mL  0  . allopurinol (ZYLOPRIM) 100 MG tablet Take 100 mg by mouth daily.       [name] aspirin 81 MG tablet Take 81 mg by mouth daily.       [name] atorvastatin (LIPITOR) 80 MG tablet TAKE 1 TABLET BY MOUTH EVERY DAY  30 tablet  0  .  azithromycin (ZITHROMAX Z-PAK) 250 MG tablet 2 by mouth daily x1 then 1 by mouth daily x4 days  6 each  0  . budesonide-formoterol (SYMBICORT) 160-4.5 MCG/ACT inhaler Inhale 2 puffs into the lungs 2 (two) times daily.  3 Inhaler  3  . carvedilol (COREG) 6.25 MG tablet Take 6.25 mg by mouth 2 (two) times daily with a meal. Take 1 tab by mouth 2 times a day      . colchicine 0.6 MG tablet Take 0.6 mg by mouth 2 (two) times daily as needed. For gout      . furosemide (LASIX) 20 MG tablet       . ipratropium (ATROVENT) 0.02 % nebulizer solution Take 2.5 mLs (0.5 mg total) by nebulization 4 (four) times daily as needed for wheezing.  75 mL  0  . isosorbide-hydrALAZINE (BIDIL) 20-37.5 MG per tablet Take one tablet by mouth  THREE times daily  90 tablet  11  . lisinopril (PRINIVIL,ZESTRIL) 10 MG tablet Take 1 tablet (10 mg) every day  90 tablet  3  . Multiple Vitamin (MULTIVITAMIN) tablet Take 1 tablet by mouth daily.       . nitroGLYCERIN (NITROSTAT) 0.4 MG SL tablet Place 1 tablet (0.4 mg total) under the tongue every 5 (five) minutes as needed for chest pain.  25 tablet  12  . omeprazole (PRILOSEC) 10 MG capsule Take 10 mg by mouth daily.      . potassium chloride SA (K-DUR,KLOR-CON) 20 MEQ tablet Take 1 tablet (20 mEq total) by mouth daily.  30 tablet  11  . spironolactone (ALDACTONE) 25 MG tablet 25 mg. TAKE 1 TABLET DAILY      . tiotropium (SPIRIVA HANDIHALER) 18 MCG inhalation capsule Place 1 capsule (18 mcg total) into inhaler and inhale daily.  90 capsule  3  . VOLTAREN 1 % GEL 2 g as needed (pain).        No current facility-administered medications for this visit.    Allergies:   No Known Allergies  Social History:  The patient  reports that she quit smoking about 6 years ago. Her smoking use included Cigarettes. She has a 44 pack-year smoking history. She does not have any smokeless tobacco history on file. She reports that she does not drink alcohol or use illicit drugs.   ROS:  Please see the history of present illness.    All other systems reviewed and negative.   PHYSICAL EXAM: VS:  BP 142/82  Pulse 80  Ht 5\' 5"  (1.651 m)  Wt 198 lb 6.4 oz (89.994 kg)  BMI 33.02 kg/m2 Well nourished, well developed, in no acute distress HEENT: normal Neck: no JVD Cardiac:  normal S1, S2; RRR; no murmur Lungs:  Decreased breath sounds bilaterally, no wheezing, rhonchi or rales Abd: soft, nontender, no hepatomegaly Ext: no edema Skin: warm and dry Neuro:  CNs 2-12 intact, no focal abnormalities noted  EKG:   NSR, HR 83, left axis deviation, poor R wave progression, no change from prior tracing   ASSESSMENT AND PLAN:  1. CAD:  No angina.  Continue aspirin and statin. 2. Chronic Systolic CHF:  Volume  stable.  Continue current therapy. 3. Ischemic Cardiomyopathy:  Continue ACEI, beta blocker, hydralazine, nitrates, spironolactone.  EF was > 35% by last assessment. 4. Hypertension:  Somewhat elevated. Adjust lisinopril to 10 mg twice daily. Check follow up basic metabolic panel in one to 2 weeks. 5. Hyperlipidemia:  Continue statin.  Arrange follow up lipids and LFTs. 6.  Disposition: Follow up with Dr. Tonny BollmanMichael Cooper in 3 mos.  Signed, Tereso NewcomerScott Anissa Abbs, PA-C  11:53 AM 01/15/2014

## 2014-01-15 NOTE — Patient Instructions (Signed)
INCREASE LISINOPRIL TO 10 MG TWICE DAILY (MAKE SURE TO SPACE OUT EVERY 12 HOURS) A NEW RX SENT IN FOR THE 10 MG TABLET  REFILLS SENT IN FOR ATORVASTATIN, COREG, OMEPRAZOLE, SPIRONOLACTONE  LAB WORK 01/26/14 FOR A FASTING LIPID AND LIVER PANEL AND BMET  Your physician wants you to follow-up in: 3 MONTHS WITH DR. Excell SeltzerOOPER. You will receive a reminder letter in the mail two months in advance. If you don't receive a letter, please call our office to schedule the follow-up appointment.

## 2014-01-24 ENCOUNTER — Telehealth: Payer: Self-pay | Admitting: Physician Assistant

## 2014-01-24 NOTE — Telephone Encounter (Signed)
New Message:  Pt states she has had a stomach virus and wants to know if she should still come for her blood work on Friday or if her labs would be off since she has been sick.

## 2014-01-24 NOTE — Telephone Encounter (Signed)
s/w pt about her being sick with stomach bug right now and wanted to rsc lab work from 4/10 to 4/13. I said that was fine. I asked if she called her PCP, she said yes but could not get through. I advised keep calling them, pt said ok.

## 2014-01-26 ENCOUNTER — Other Ambulatory Visit: Payer: Medicare Other

## 2014-01-29 ENCOUNTER — Other Ambulatory Visit (INDEPENDENT_AMBULATORY_CARE_PROVIDER_SITE_OTHER): Payer: Medicare Other

## 2014-01-29 DIAGNOSIS — E785 Hyperlipidemia, unspecified: Secondary | ICD-10-CM

## 2014-01-29 DIAGNOSIS — I10 Essential (primary) hypertension: Secondary | ICD-10-CM

## 2014-01-29 LAB — BASIC METABOLIC PANEL
BUN: 21 mg/dL (ref 6–23)
CO2: 24 meq/L (ref 19–32)
Calcium: 8.9 mg/dL (ref 8.4–10.5)
Chloride: 109 mEq/L (ref 96–112)
Creatinine, Ser: 0.9 mg/dL (ref 0.4–1.2)
GFR: 79.54 mL/min (ref >60.00)
GLUCOSE: 92 mg/dL (ref 70–99)
POTASSIUM: 3.9 meq/L (ref 3.5–5.1)
Sodium: 141 mEq/L (ref 135–145)

## 2014-01-29 LAB — HEPATIC FUNCTION PANEL
ALBUMIN: 3.5 g/dL (ref 3.5–5.2)
ALT: 49 U/L — ABNORMAL HIGH (ref 0–35)
AST: 36 U/L (ref 0–37)
Alkaline Phosphatase: 86 U/L (ref 39–117)
Bilirubin, Direct: 0.1 mg/dL (ref 0.0–0.3)
Total Bilirubin: 0.5 mg/dL (ref 0.3–1.2)
Total Protein: 7 g/dL (ref 6.0–8.3)

## 2014-01-29 LAB — LIPID PANEL
Cholesterol: 75 mg/dL (ref 0–200)
HDL: 24.4 mg/dL — ABNORMAL LOW (ref >39.00)
LDL Cholesterol: 29 mg/dL (ref 0–99)
Total CHOL/HDL Ratio: 3
Triglycerides: 106 mg/dL (ref 0.0–149.0)
VLDL: 21.2 mg/dL (ref 0.0–40.0)

## 2014-04-13 ENCOUNTER — Encounter (HOSPITAL_COMMUNITY): Payer: Self-pay | Admitting: Emergency Medicine

## 2014-04-13 ENCOUNTER — Emergency Department (HOSPITAL_COMMUNITY)
Admission: EM | Admit: 2014-04-13 | Discharge: 2014-04-13 | Disposition: A | Discharge status: Home / Self Care | Payer: Medicare Other | Attending: Emergency Medicine | Admitting: Emergency Medicine

## 2014-04-13 DIAGNOSIS — I8000 Phlebitis and thrombophlebitis of superficial vessels of unspecified lower extremity: Secondary | ICD-10-CM | POA: Diagnosis not present

## 2014-04-13 DIAGNOSIS — Z951 Presence of aortocoronary bypass graft: Secondary | ICD-10-CM | POA: Insufficient documentation

## 2014-04-13 DIAGNOSIS — E785 Hyperlipidemia, unspecified: Secondary | ICD-10-CM | POA: Insufficient documentation

## 2014-04-13 DIAGNOSIS — Z7982 Long term (current) use of aspirin: Secondary | ICD-10-CM | POA: Diagnosis not present

## 2014-04-13 DIAGNOSIS — IMO0002 Reserved for concepts with insufficient information to code with codable children: Secondary | ICD-10-CM | POA: Insufficient documentation

## 2014-04-13 DIAGNOSIS — Z862 Personal history of diseases of the blood and blood-forming organs and certain disorders involving the immune mechanism: Secondary | ICD-10-CM | POA: Diagnosis not present

## 2014-04-13 DIAGNOSIS — J438 Other emphysema: Secondary | ICD-10-CM | POA: Insufficient documentation

## 2014-04-13 DIAGNOSIS — Z87891 Personal history of nicotine dependence: Secondary | ICD-10-CM | POA: Insufficient documentation

## 2014-04-13 DIAGNOSIS — Z79899 Other long term (current) drug therapy: Secondary | ICD-10-CM | POA: Insufficient documentation

## 2014-04-13 DIAGNOSIS — I2510 Atherosclerotic heart disease of native coronary artery without angina pectoris: Secondary | ICD-10-CM | POA: Diagnosis not present

## 2014-04-13 DIAGNOSIS — I5022 Chronic systolic (congestive) heart failure: Secondary | ICD-10-CM | POA: Diagnosis not present

## 2014-04-13 DIAGNOSIS — I809 Phlebitis and thrombophlebitis of unspecified site: Secondary | ICD-10-CM

## 2014-04-13 DIAGNOSIS — M79609 Pain in unspecified limb: Secondary | ICD-10-CM | POA: Diagnosis present

## 2014-04-13 NOTE — ED Notes (Signed)
Per pt, leg pain to left side. Started 8-9 am.  Pt has bruised are to left shin.  Pt states pain continues down leg.  Does not recall trauma.

## 2014-04-13 NOTE — ED Provider Notes (Signed)
CSN: [id number]     Arrival date & time 04/13/14  1709 History   First MD Initiated Contact with Patient 04/13/14 1900     Chief Complaint  Patient presents with  . Leg Pain      HPI Per pt, leg pain to left side. Started 8-9 am. Pt has bruised are to left shin. Pt states pain continues down leg. Does not recall trauma  Past Medical History  Diagnosis Date  . Chronic systolic heart failure   . Other emphysema   . Shortness of breath   . HLD (hyperlipidemia)   . HTN (hypertension)   . CAD (coronary artery disease)     s/p NSTEMI 2009 - s/p CABG 2009: L-LAD, S-OM1 and OM2, S-Dx, S-PDA.;  Myoview 11/04/11:  Small inf scar, mod ant apical and septal scar, no ischemia, EF 31%.  . Cardiomyopathy, ischemic     a.  LVEF 35-40%;  b. Echo 5/14: Moderate LVH, EF 30-35%, mid to distal anteroseptal and apical HK  . Thrombocytopenia   . HIT (heparin-induced thrombocytopenia)    Past Surgical History  Procedure Laterality Date  . Coronary artery bypass graft  November 21, 2007    5 vessel   Family History  Problem Relation Age of Onset  . Asthma Mother   . Hypertension Mother   . Diabetes Mother   . Rheum arthritis Mother    History  Substance Use Topics  . Smoking status: Former Smoker -- 1.00 packs/day for 44 years    Types: Cigarettes    Quit date: 10/20/2007  . Smokeless tobacco: Not on file     Comment: started at age [age]s.   . Alcohol Use: No   OB History   Grav Para Term Preterm Abortions TAB SAB Ect Mult Living                 Review of Systems  All other systems reviewed and are negative.     Allergies  Review of patient's allergies indicates no known allergies.  Home Medications   Prior to Admission medications   Medication Sig Start Date End Date Taking? Authorizing Provider  albuterol (PROVENTIL HFA;VENTOLIN HFA) 108 (90 BASE) MCG/ACT inhaler 1-2 puffs every 6 (six) hours as needed (wheezing). 1-2 puffs every 4-6 hours as needed 01/08/11  Yes Barbaraann ShareKeith M Clance,  MD  allopurinol (ZYLOPRIM) 100 MG tablet Take 100 mg by mouth daily.    Yes Historical Provider, MD  aspirin 81 MG tablet Take 81 mg by mouth daily.    Yes Historical Provider, MD  atorvastatin (LIPITOR) 80 MG tablet TAKE 1 TABLET BY MOUTH EVERY DAY 01/15/14  Yes Beatrice LecherScott T Weaver, PA-C  budesonide-formoterol (SYMBICORT) 160-4.5 MCG/ACT inhaler Inhale 2 puffs into the lungs 2 (two) times daily. 02/25/12 04/13/14 Yes Barbaraann ShareKeith M Clance, MD  carvedilol (COREG) 6.25 MG tablet Take 1 tablet (6.25 mg total) by mouth 2 (two) times daily with a meal. Take 1 tab by mouth 2 times a day 01/15/14  Yes Beatrice LecherScott T Weaver, PA-C  colchicine 0.6 MG tablet Take 0.6 mg by mouth 2 (two) times daily as needed. For gout   Yes Historical Provider, MD  furosemide (LASIX) 20 MG tablet Take 20 mg by mouth 2 (two) times daily as needed (fluid).  12/23/13  Yes Historical Provider, MD  isosorbide-hydrALAZINE (BIDIL) 20-37.5 MG per tablet Take 1 tablet by mouth 3 (three) times daily. 10/16/13  Yes Scott Moishe Spice Weaver, PA-C  lisinopril (PRINIVIL,ZESTRIL) 10 MG tablet Take 10 mg by  mouth 2 (two) times daily. 01/15/14  Yes Beatrice LecherScott T Weaver, PA-C  Multiple Vitamin (MULTIVITAMIN) tablet Take 1 tablet by mouth daily.    Yes Historical Provider, MD  nitroGLYCERIN (NITROSTAT) 0.4 MG SL tablet Place 1 tablet (0.4 mg total) under the tongue every 5 (five) minutes as needed for chest pain. 10/27/11 04/13/14 Yes Scott T Weaver, PA-C  omeprazole (PRILOSEC) 10 MG capsule Take 1 capsule (10 mg total) by mouth daily. 01/15/14  Yes Beatrice LecherScott T Weaver, PA-C  potassium chloride SA (K-DUR,KLOR-CON) 20 MEQ tablet Take 1 tablet (20 mEq total) by mouth daily. 07/07/13  Yes Scott Moishe Spice Weaver, PA-C  spironolactone (ALDACTONE) 25 MG tablet Take 25 mg by mouth daily. TAKE 1 TABLET DAILY 01/15/14  Yes Beatrice LecherScott T Weaver, PA-C  tiotropium (SPIRIVA HANDIHALER) 18 MCG inhalation capsule Place 1 capsule (18 mcg total) into inhaler and inhale daily. 02/25/12 04/13/14 Yes Barbaraann ShareKeith M Clance, MD  albuterol  (PROVENTIL) (5 MG/ML) 0.5% nebulizer solution Take 0.5 mLs (2.5 mg total) by nebulization every 2 (two) hours as needed for wheezing or shortness of breath. 09/16/11 01/15/14  Shanker Levora DredgeM Ghimire, MD  ipratropium (ATROVENT) 0.02 % nebulizer solution Take 2.5 mLs (0.5 mg total) by nebulization 4 (four) times daily as needed for wheezing. 09/16/11 01/15/14  Shanker Levora DredgeM Ghimire, MD   BP 163/84  Pulse 80  Temp(Src) 98.6 F (37 C) (Oral)  Resp 16  SpO2 98% Physical Exam  Nursing note and vitals reviewed. Constitutional: She is oriented to person, place, and time. She appears well-developed and well-nourished. No distress.  HENT:  Head: Normocephalic and atraumatic.  Eyes: Pupils are equal, round, and reactive to light.  Neck: Normal range of motion.  Cardiovascular: Normal rate and intact distal pulses.   Pulmonary/Chest: No respiratory distress.  Abdominal: Normal appearance. She exhibits no distension.  Musculoskeletal: Normal range of motion.       Left lower leg: She exhibits tenderness (the area on the front of the leg over the tibia which is bruised is tender to palpation). She exhibits no swelling and no edema.       Legs: Homans sign is negative the left calf  Neurological: She is alert and oriented to person, place, and time. No cranial nerve deficit.  Skin: Skin is warm and dry. No rash noted.  Psychiatric: She has a normal mood and affect. Her behavior is normal.    ED Course  Procedures (including critical care time) Labs Review Labs Reviewed - No data to display  Imaging Review         MDM   Final diagnoses:  Superficial phlebitis         Nelia Shiobert L Sidhant Helderman, MD 04/15/14 1946

## 2014-04-13 NOTE — Discharge Instructions (Signed)
Phlebitis Phlebitis is soreness and puffiness (swelling) in a vein.  HOME CARE  Only take medicine as told by your doctor.  Raise (elevate) the affected limb on a pillow as told by your doctor.  Keep a warm pack on the affected vein as told by your doctor. Do not sleep with a heating pad.  Use special stockings or bandages around the area of the affected vein as told by your doctor. These will speed healing and keep the condition from coming back.  Talk to your doctor about all the medicines you take.  Get follow-up blood tests as told by your doctor.  If the phlebitis is in your legs:  Avoid standing or resting for long periods.  Keep your legs moving. Raise your legs when you sit or lie.  Do not smoke.  Follow-up with your doctor as told. GET HELP IF:  You have strange bruises or bleeding.  Your puffiness or pain in the affected area is not getting better.  You are taking medicine to lessen puffiness (anti-inflammatory medicine), and you get belly pain.  You have a fever. GET HELP RIGHT AWAY IF:   The phlebitis gets worse and you have more pain, puffiness (swelling), or redness.  You have trouble breathing or have chest pain. MAKE SURE YOU:   Understand these instructions.  Will watch your condition.  Will get help right away if you are not doing well or get worse. Document Released: 09/23/2009 Document Revised: 10/10/2013 Document Reviewed: 06/12/2013 [employer] Patient Information 2015 [employer]. This information is not intended to replace advice given to you by your health care provider. Make sure you discuss any questions you have with your health care provider.  

## 2014-06-26 ENCOUNTER — Other Ambulatory Visit: Payer: Self-pay | Admitting: Physician Assistant

## 2014-07-03 ENCOUNTER — Ambulatory Visit (INDEPENDENT_AMBULATORY_CARE_PROVIDER_SITE_OTHER): Payer: Medicare Other | Admitting: Cardiovascular Disease

## 2014-07-03 ENCOUNTER — Encounter: Payer: Self-pay | Admitting: Cardiovascular Disease

## 2014-07-03 VITALS — BP 122/72 | HR 82 | Ht 65.0 in | Wt 197.0 lb

## 2014-07-03 DIAGNOSIS — I5022 Chronic systolic (congestive) heart failure: Secondary | ICD-10-CM

## 2014-07-03 DIAGNOSIS — I10 Essential (primary) hypertension: Secondary | ICD-10-CM

## 2014-07-03 DIAGNOSIS — I2510 Atherosclerotic heart disease of native coronary artery without angina pectoris: Secondary | ICD-10-CM

## 2014-07-03 DIAGNOSIS — E785 Hyperlipidemia, unspecified: Secondary | ICD-10-CM

## 2014-07-03 NOTE — Progress Notes (Signed)
HPI:  73 year-old woman presenting for follow-up evaluation.  She's followed for chronic systolic heart failure, HTN, and CAD. The patient underwent multivessel CABG in 2009 after presenting with NSTEMI in the setting of respiratory failure. She has undergone EP evaluation for consideration of an ICD because of residual severe LV dysfunction. Ongoing medical therapy was recommended and her LVEF and improved to the range of 35-40% on followup echocardiography.   Last lipids April 2015: Lipid Panel     Component Value Date/Time   CHOL 75 01/29/2014 1137   TRIG 106.0 01/29/2014 1137   HDL 24.40* 01/29/2014 1137   CHOLHDL 3 01/29/2014 1137   VLDL 21.2 01/29/2014 1137   LDLCALC 29 01/29/2014 1137   The patient has been doing well from a cardiac perspective. She denies any recent issues with chest pain, chest pressure, lightheadedness, or syncope. She has chronic shortness of breath related to COPD and chronic systolic heart failure. Symptoms are unchanged. She denies orthopnea, PND, or edema. She's been wearing oxygen as needed at home.  Outpatient Encounter Prescriptions as of 07/03/2014  Medication Sig  . acetaminophen-codeine (TYLENOL #3) 300-30 MG per tablet Take 1 tablet by mouth 2 (two) times daily as needed for severe pain.   [name] albuterol (PROVENTIL HFA;VENTOLIN HFA) 108 (90 BASE) MCG/ACT inhaler 1-2 puffs every 6 (six) hours as needed (wheezing). 1-2 puffs every 4-6 hours as needed  . allopurinol (ZYLOPRIM) 100 MG tablet Take 100 mg by mouth daily.   [name] aspirin 81 MG tablet Take 81 mg by mouth daily.   [name] atorvastatin (LIPITOR) 80 MG tablet TAKE 1 TABLET BY MOUTH EVERY DAY  . carvedilol (COREG) 6.25 MG tablet Take 1 tablet (6.25 mg total) by mouth 2 (two) times daily with a meal. Take 1 tab by mouth 2 times a day  . colchicine 0.6 MG tablet Take 0.6 mg by mouth 2 (two) times daily as needed. For gout  . furosemide (LASIX) 20 MG tablet Take 20 mg by mouth 2 (two) times daily as needed (fluid).    . isosorbide-hydrALAZINE (BIDIL) 20-37.5 MG per tablet Take 1 tablet by mouth 3 (three) times daily.  [name] lisinopril (PRINIVIL,ZESTRIL) 10 MG tablet Take 10 mg by mouth daily after breakfast.   . Multiple Vitamin (MULTIVITAMIN) tablet Take 1 tablet by mouth daily.   [name] omeprazole (PRILOSEC) 10 MG capsule Take 1 capsule (10 mg total) by mouth daily.  . potassium chloride SA (K-DUR,KLOR-CON) 20 MEQ tablet TAKE 1 TABLET BY MOUTH DAILY  . spironolactone (ALDACTONE) 25 MG tablet Take 25 mg by mouth daily. TAKE 1 TABLET DAILY  . albuterol (PROVENTIL) (5 MG/ML) 0.5% nebulizer solution Take 0.5 mLs (2.5 mg total) by nebulization every 2 (two) hours as needed for wheezing or shortness of breath.  . budesonide-formoterol (SYMBICORT) 160-4.5 MCG/ACT inhaler Inhale 2 puffs into the lungs 2 (two) times daily.  [name] ipratropium (ATROVENT) 0.02 % nebulizer solution Take 2.5 mLs (0.5 mg total) by nebulization 4 (four) times daily as needed for wheezing.  . nitroGLYCERIN (NITROSTAT) 0.4 MG SL tablet Place 1 tablet (0.4 mg total) under the tongue every 5 (five) minutes as needed for chest pain.  [name] tiotropium (SPIRIVA HANDIHALER) 18 MCG inhalation capsule Place 1 capsule (18 mcg total) into inhaler and inhale daily.    No Known Allergies  Past Medical History  Diagnosis Date  . Chronic systolic heart failure   . Other emphysema   . Shortness of breath   . HLD (hyperlipidemia)   .  HTN (hypertension)   . CAD (coronary artery disease)     s/p NSTEMI 2009 - s/p CABG 2009: L-LAD, S-OM1 and OM2, S-Dx, S-PDA.;  Myoview 11/04/11:  Small inf scar, mod ant apical and septal scar, no ischemia, EF 31%.  . Cardiomyopathy, ischemic     a.  LVEF 35-40%;  b. Echo 5/14: Moderate LVH, EF 30-35%, mid to distal anteroseptal and apical HK  . Thrombocytopenia   . HIT (heparin-induced thrombocytopenia)     ROS: Negative except as per HPI  BP 122/72  Pulse 82  Ht  (1.651 m)  Wt 197 lb (89.359 kg)  BMI 32.78  kg/m2  PHYSICAL EXAM: Pt is alert and oriented, pleasant overweight woman in NAD HEENT: normal Neck: JVP - normal, carotids 2+= without bruits Lungs: Diminished in the bases bilaterally CV: RRR without murmur or gallop Abd: soft, NT, Positive BS, no hepatomegaly Ext: no C/C/E, distal pulses intact and equal Skin: warm/dry no rash  EKG:  Normal sinus rhythm 82 beats per minute, occasional PVC, LVH with QRS widening. Pulmonary disease pattern with poor R-wave progression and left axis deviation  2D Echo 06/23/2013: Study Conclusions  - Left ventricle: The cavity size was normal. There was mild focal basal hypertrophy of the septum. Systolic function was moderately reduced. The estimated ejection fraction was in the range of 35% to 40%. There is akinesis of the mid-distalanteroseptal and apical myocardium. There is akinesis of the mid-distalinferior myocardium. Features are consistent with a pseudonormal left ventricular filling pattern, with concomitant abnormal relaxation and increased filling pressure (grade 2 diastolic dysfunction). - Mitral valve: Mild regurgitation. - Right ventricle: Systolic function was reduced. - Tricuspid valve: Moderate regurgitation. - Pulmonary arteries: Systolic pressure was moderately increased. PA peak pressure: 47mm Hg (S).  ASSESSMENT AND PLAN: 1. Chronic systolic heart failure, New York Heart Association functional class II. The patient has stable symptoms. She is on a good medical program which includes carvedilol, lisinopril, isosorbide, hydralazine, and spironolactone. Lab work from April 2015 was reviewed and her potassium and renal function was within normal limits. She will continue on her current medications and followup next year.  2. Hypertension. Blood pressure is well controlled on her regimen as outlined above.  3. Hyperlipidemia. The patient is on high intensity statin therapy with atorvastatin 80 mg daily. Lipids as above.  4.  Coronary artery disease status post CABG with no anginal symptoms at present. Continue current medical management.  For followup I will see her back in one year. She understands to call if any problems arise.  Tonny Bollman MD 07/03/2014 4:40 PM

## 2014-07-03 NOTE — Patient Instructions (Signed)
Your physician wants you to follow-up in: 1 YEAR with Dr Cooper.  You will receive a reminder letter in the mail two months in advance. If you don't receive a letter, please call our office to schedule the follow-up appointment.  Your physician recommends that you continue on your current medications as directed. Please refer to the Current Medication list given to you today.  

## 2014-07-11 ENCOUNTER — Ambulatory Visit: Payer: Medicare Other | Admitting: Cardiovascular Disease

## 2014-07-25 ENCOUNTER — Other Ambulatory Visit: Payer: Self-pay | Admitting: Cardiovascular Disease

## 2014-11-15 DIAGNOSIS — J449 Chronic obstructive pulmonary disease, unspecified: Secondary | ICD-10-CM | POA: Diagnosis not present

## 2014-11-19 DIAGNOSIS — J449 Chronic obstructive pulmonary disease, unspecified: Secondary | ICD-10-CM | POA: Diagnosis not present

## 2014-12-16 DIAGNOSIS — J449 Chronic obstructive pulmonary disease, unspecified: Secondary | ICD-10-CM | POA: Diagnosis not present

## 2015-01-14 DIAGNOSIS — J449 Chronic obstructive pulmonary disease, unspecified: Secondary | ICD-10-CM | POA: Diagnosis not present

## 2015-02-14 DIAGNOSIS — J449 Chronic obstructive pulmonary disease, unspecified: Secondary | ICD-10-CM | POA: Diagnosis not present

## 2015-03-13 ENCOUNTER — Encounter: Payer: Self-pay | Admitting: Cardiovascular Disease

## 2015-03-16 DIAGNOSIS — J449 Chronic obstructive pulmonary disease, unspecified: Secondary | ICD-10-CM | POA: Diagnosis not present

## 2015-04-16 DIAGNOSIS — J449 Chronic obstructive pulmonary disease, unspecified: Secondary | ICD-10-CM | POA: Diagnosis not present

## 2015-04-18 ENCOUNTER — Other Ambulatory Visit: Payer: Self-pay | Admitting: Physician Assistant

## 2015-05-16 DIAGNOSIS — J449 Chronic obstructive pulmonary disease, unspecified: Secondary | ICD-10-CM | POA: Diagnosis not present

## 2015-06-16 DIAGNOSIS — J449 Chronic obstructive pulmonary disease, unspecified: Secondary | ICD-10-CM | POA: Diagnosis not present

## 2015-06-20 DIAGNOSIS — M5136 Other intervertebral disc degeneration, lumbar region: Secondary | ICD-10-CM | POA: Diagnosis not present

## 2015-06-20 DIAGNOSIS — M6580 Other synovitis and tenosynovitis, unspecified site: Secondary | ICD-10-CM | POA: Diagnosis not present

## 2015-06-20 DIAGNOSIS — Z131 Encounter for screening for diabetes mellitus: Secondary | ICD-10-CM | POA: Diagnosis not present

## 2015-06-20 DIAGNOSIS — E784 Other hyperlipidemia: Secondary | ICD-10-CM | POA: Diagnosis not present

## 2015-06-20 DIAGNOSIS — I10 Essential (primary) hypertension: Secondary | ICD-10-CM | POA: Diagnosis not present

## 2015-06-20 DIAGNOSIS — M10069 Idiopathic gout, unspecified knee: Secondary | ICD-10-CM | POA: Diagnosis not present

## 2015-07-05 ENCOUNTER — Ambulatory Visit: Payer: Medicaid Other | Admitting: Cardiovascular Disease

## 2015-07-11 ENCOUNTER — Encounter: Payer: Self-pay | Admitting: Cardiovascular Disease

## 2015-07-11 ENCOUNTER — Ambulatory Visit (INDEPENDENT_AMBULATORY_CARE_PROVIDER_SITE_OTHER): Payer: Medicare Other | Admitting: Cardiovascular Disease

## 2015-07-11 VITALS — BP 140/86 | HR 79 | Ht 65.0 in | Wt 208.4 lb

## 2015-07-11 DIAGNOSIS — I5022 Chronic systolic (congestive) heart failure: Secondary | ICD-10-CM | POA: Diagnosis not present

## 2015-07-11 MED ORDER — FUROSEMIDE 20 MG PO TABS: 20.0000 mg | ORAL_TABLET | Freq: Every day | ORAL | Status: DC

## 2015-07-11 NOTE — Patient Instructions (Addendum)
Medication Instructions:   START TAKING LASIX 20 MG ONCE A DAY   Labwork:  NONE ORDER TODAY    Testing/Procedures:  NONE ORDER TODAY    Follow-Up:  Your physician wants you to follow-up in: ONE YEAR WITH  DR Theodoro Parma will receive a reminder letter in the mail two months in advance. If you don't receive a letter, please call our office to schedule the follow-up appointment.     Any Other Special Instructions Will Be Listed Below (If Applicable).

## 2015-07-11 NOTE — Progress Notes (Signed)
Cardiology Office Note Date:  07/11/2015   ID:  [name], DOB December 22, 1940, MRN [id number]  PCP:  Dorrene German, MD  Cardiologist:  Tonny Bollman, MD    Chief Complaint  Patient presents with  . Shortness of Breath     History of Present Illness: [name] is a 74 y.o. female who presents for  Follow-up of coronary artery disease and chronic systolic heart failure. The patient underwent multivessel CABG in 2009 after presenting with non-ST elevation infarction in the setting of respiratory failure. The patient's LVEF last assessed in 2014 was 35-40%.  Other medical problems include hypertension and hyperlipidemia.  Primary issue is low back pain. She's had difficulty with mobility because of back pain. Shortness of breath bothers her worse in the heat and humid conditions make things much worse. She denies chest pain or pressure. Denies orthopnea, PND.  She does admit to leg swelling at times.  Past Medical History  Diagnosis Date  . Chronic systolic heart failure   . Other emphysema   . Shortness of breath   . HLD (hyperlipidemia)   . HTN (hypertension)   . CAD (coronary artery disease)     s/p NSTEMI 2009 - s/p CABG 2009: L-LAD, S-OM1 and OM2, S-Dx, S-PDA.;  Myoview 11/04/11:  Small inf scar, mod ant apical and septal scar, no ischemia, EF 31%.  . Cardiomyopathy, ischemic     a.  LVEF 35-40%;  b. Echo 5/14: Moderate LVH, EF 30-35%, mid to distal anteroseptal and apical HK  . Thrombocytopenia   . HIT (heparin-induced thrombocytopenia)     Past Surgical History  Procedure Laterality Date  . Coronary artery bypass graft  November 21, 2007    5 vessel    Current Outpatient Prescriptions  Medication Sig Dispense Refill  . acetaminophen-codeine (TYLENOL #3) 300-30 MG per tablet Take 1 tablet by mouth 2 (two) times daily as needed for severe pain.     [name] albuterol (PROVENTIL HFA;VENTOLIN HFA) 108 (90 BASE) MCG/ACT inhaler 1-2 puffs every 6 (six) hours as needed (wheezing).  1-2 puffs every 4-6 hours as needed    . allopurinol (ZYLOPRIM) 100 MG tablet Take 100 mg by mouth daily.     [name] aspirin 81 MG tablet Take 81 mg by mouth daily.     [name] atorvastatin (LIPITOR) 80 MG tablet TAKE 1 TABLET BY MOUTH EVERY DAY 30 tablet 2  . carvedilol (COREG) 6.25 MG tablet Take 1 tablet (6.25 mg total) by mouth 2 (two) times daily with a meal. Take 1 tab by mouth 2 times a day 60 tablet 11  . colchicine 0.6 MG tablet Take 0.6 mg by mouth 2 (two) times daily as needed. For gout    . furosemide (LASIX) 20 MG tablet Take 1 tablet (20 mg total) by mouth daily. 30 tablet 11  . isosorbide-hydrALAZINE (BIDIL) 20-37.5 MG per tablet Take 1 tablet by mouth 3 (three) times daily.    [name] lisinopril (PRINIVIL,ZESTRIL) 10 MG tablet Take 10 mg by mouth daily after breakfast.     . Multiple Vitamin (MULTIVITAMIN) tablet Take 1 tablet by mouth daily.     [name] omeprazole (PRILOSEC) 10 MG capsule Take 1 capsule (10 mg total) by mouth daily. 30 capsule 3  . potassium chloride SA (K-DUR,KLOR-CON) 20 MEQ tablet TAKE 1 TABLET BY MOUTH DAILY 30 tablet 9  . spironolactone (ALDACTONE) 25 MG tablet Take 25 mg by mouth daily. TAKE 1 TABLET DAILY    . albuterol (PROVENTIL) (5 MG/ML) 0.5% nebulizer  solution Take 0.5 mLs (2.5 mg total) by nebulization every 2 (two) hours as needed for wheezing or shortness of breath. 20 mL 0  . budesonide-formoterol (SYMBICORT) 160-4.5 MCG/ACT inhaler Inhale 2 puffs into the lungs 2 (two) times daily. 3 Inhaler 3  . ipratropium (ATROVENT) 0.02 % nebulizer solution Take 2.5 mLs (0.5 mg total) by nebulization 4 (four) times daily as needed for wheezing. 75 mL 0  . nitroGLYCERIN (NITROSTAT) 0.4 MG SL tablet Place 1 tablet (0.4 mg total) under the tongue every 5 (five) minutes as needed for chest pain. 25 tablet 12  . tiotropium (SPIRIVA HANDIHALER) 18 MCG inhalation capsule Place 1 capsule (18 mcg total) into inhaler and inhale daily. 90 capsule 3   No current facility-administered  medications for this visit.    Allergies:   Review of patient's allergies indicates no known allergies.   Social History:  The patient  reports that she quit smoking about 7 years ago. Her smoking use included Cigarettes. She has a 44 pack-year smoking history. She does not have any smokeless tobacco history on file. She reports that she does not drink alcohol or use illicit drugs.   Family History:  The patient's  family history includes Asthma in her mother; Diabetes in her mother; Hypertension in her mother; Rheum arthritis in her mother.    ROS:  Please see the history of present illness.  Otherwise, review of systems is positive for  Leg swelling, exertional dyspnea, leg pain, wheezing.  All other systems are reviewed and negative.    PHYSICAL EXAM: VS:  BP 140/86 mmHg  Pulse 79  Ht  (1.651 m)  Wt 208 lb 6.4 oz (94.53 kg)  BMI 34.68 kg/m2 , BMI Body mass index is 34.68 kg/(m^2). GEN: Pleasant overweight woman in no acute distress HEENT: normal Neck: no JVD, no masses. No carotid bruits Cardiac: RRR without murmur or gallop                Respiratory:  clear to auscultation bilaterally, normal work of breathing GI: soft, nontender, nondistended, + BS, obese MS: no deformity or atrophy Ext: no pretibial edema Skin: warm and dry, no rash Neuro:  Strength and sensation are intact Psych: euthymic mood, full affect  EKG:  EKG is ordered today. The ekg ordered today shows NSR 79 bpm, occasional PVC's, LVH with repolarization abnormalities.  Recent Labs: No results found for requested labs within last 365 days.   Lipid Panel     Component Value Date/Time   CHOL 75 01/29/2014 1137   TRIG 106.0 01/29/2014 1137   HDL 24.40* 01/29/2014 1137   CHOLHDL 3 01/29/2014 1137   VLDL 21.2 01/29/2014 1137   LDLCALC 29 01/29/2014 1137      Wt Readings from Last 3 Encounters:  07/11/15 208 lb 6.4 oz (94.53 kg)  07/03/14 197 lb (89.359 kg)  01/15/14 198 lb 6.4 oz (89.994 kg)       ASSESSMENT AND PLAN: 1.   Chronic systolic heart failure, New York Heart Association functional class II: the patient continues on multidrug therapy with by BiDil,  Carvedilol , lisinopril, and spironolactone. I recommended that she take furosemide 20 mg every day as a scheduled drug rather than just as needed. She continues to follow closely with primary care. She has trouble with transportation. I will see her back next year for follow-up.  2. Essential hypertension: Blood pressure well controlled on current medications  3. Hyperlipidemia: She remains on atorvastatin 80 mg daily  4.  Coronary artery disease status post CABG: No anginal symptoms. Continue secondary risk reduction measures with aspirin, a statin drug, beta blocker, and an ACE inhibitor.   Current medicines are reviewed with the patient today.  The patient does not have concerns regarding medicines.  Labs/ tests ordered today include:   Orders Placed This Encounter  Procedures  . EKG 12-Lead    Disposition:   FU one year  Signed, Tonny Bollman, MD  07/11/2015 3:20 PM    [facility] [address] [place], [place], [place]  [address] Phone: [phone]; Fax: [phone]

## 2015-07-15 ENCOUNTER — Other Ambulatory Visit: Payer: Self-pay

## 2015-07-15 MED ORDER — ATORVASTATIN CALCIUM 80 MG PO TABS: 80.0000 mg | ORAL_TABLET | Freq: Every day | ORAL | Status: DC

## 2015-07-15 MED ORDER — SPIRONOLACTONE 25 MG PO TABS: 25.0000 mg | ORAL_TABLET | Freq: Every day | ORAL | Status: DC

## 2015-07-17 DIAGNOSIS — J449 Chronic obstructive pulmonary disease, unspecified: Secondary | ICD-10-CM | POA: Diagnosis not present

## 2015-08-01 DIAGNOSIS — M5136 Other intervertebral disc degeneration, lumbar region: Secondary | ICD-10-CM | POA: Diagnosis not present

## 2015-08-01 DIAGNOSIS — I10 Essential (primary) hypertension: Secondary | ICD-10-CM | POA: Diagnosis not present

## 2015-08-01 DIAGNOSIS — D696 Thrombocytopenia, unspecified: Secondary | ICD-10-CM | POA: Diagnosis not present

## 2015-08-01 DIAGNOSIS — J4520 Mild intermittent asthma, uncomplicated: Secondary | ICD-10-CM | POA: Diagnosis not present

## 2015-08-01 DIAGNOSIS — I2510 Atherosclerotic heart disease of native coronary artery without angina pectoris: Secondary | ICD-10-CM | POA: Diagnosis not present

## 2015-08-01 DIAGNOSIS — Z23 Encounter for immunization: Secondary | ICD-10-CM | POA: Diagnosis not present

## 2015-08-09 ENCOUNTER — Other Ambulatory Visit: Payer: Self-pay | Admitting: Cardiovascular Disease

## 2015-08-16 DIAGNOSIS — J449 Chronic obstructive pulmonary disease, unspecified: Secondary | ICD-10-CM | POA: Diagnosis not present

## 2015-08-24 ENCOUNTER — Other Ambulatory Visit: Payer: Self-pay | Admitting: Internal Medicine

## 2015-08-24 DIAGNOSIS — E2839 Other primary ovarian failure: Secondary | ICD-10-CM

## 2015-09-16 DIAGNOSIS — J449 Chronic obstructive pulmonary disease, unspecified: Secondary | ICD-10-CM | POA: Diagnosis not present

## 2015-09-18 ENCOUNTER — Other Ambulatory Visit: Payer: Self-pay | Admitting: Cardiovascular Disease

## 2015-09-20 ENCOUNTER — Other Ambulatory Visit: Payer: Medicare Other

## 2015-10-16 DIAGNOSIS — J449 Chronic obstructive pulmonary disease, unspecified: Secondary | ICD-10-CM | POA: Diagnosis not present

## 2016-07-13 ENCOUNTER — Encounter: Payer: Self-pay | Admitting: Cardiovascular Disease

## 2016-07-13 ENCOUNTER — Ambulatory Visit (INDEPENDENT_AMBULATORY_CARE_PROVIDER_SITE_OTHER): Payer: Medicare Other | Admitting: Cardiovascular Disease

## 2016-07-13 ENCOUNTER — Other Ambulatory Visit: Payer: Self-pay | Admitting: Cardiovascular Disease

## 2016-07-13 DIAGNOSIS — I5022 Chronic systolic (congestive) heart failure: Secondary | ICD-10-CM | POA: Diagnosis not present

## 2016-07-13 DIAGNOSIS — R0602 Shortness of breath: Secondary | ICD-10-CM | POA: Diagnosis not present

## 2016-07-13 DIAGNOSIS — I10 Essential (primary) hypertension: Secondary | ICD-10-CM

## 2016-07-13 LAB — BASIC METABOLIC PANEL
BUN: 13 mg/dL (ref 7–25)
CALCIUM: 8.9 mg/dL (ref 8.6–10.4)
CO2: 28 mmol/L (ref 20–31)
Chloride: 108 mmol/L (ref 98–110)
Creat: 1.12 mg/dL — ABNORMAL HIGH (ref 0.60–0.93)
Glucose, Bld: 110 mg/dL — ABNORMAL HIGH (ref 65–99)
POTASSIUM: 4.3 mmol/L (ref 3.5–5.3)
SODIUM: 143 mmol/L (ref 135–146)

## 2016-07-13 LAB — BRAIN NATRIURETIC PEPTIDE: BRAIN NATRIURETIC PEPTIDE: 211.9 pg/mL — AB (ref <100)

## 2016-07-13 MED ORDER — NITROGLYCERIN 0.4 MG SL SUBL: 0.4000 mg | SUBLINGUAL_TABLET | SUBLINGUAL | 3 refills | Status: DC | PRN

## 2016-07-13 MED ORDER — SPIRONOLACTONE 25 MG PO TABS: 25.0000 mg | ORAL_TABLET | Freq: Every day | ORAL | 3 refills | Status: DC

## 2016-07-13 MED ORDER — CARVEDILOL 6.25 MG PO TABS: 6.2500 mg | ORAL_TABLET | Freq: Two times a day (BID) | ORAL | 3 refills | Status: DC

## 2016-07-13 NOTE — Patient Instructions (Signed)
Medication Instructions:  Your physician recommends that you continue on your current medications as directed. Please refer to the Current Medication list given to you today.  Labwork: Your physician recommends that you have lab work today: BMP and BNP  Testing/Procedures: No new orders.   Follow-Up: Your physician wants you to follow-up in: 6 MONTHS with Tereso NewcomerScott Weaver PA-C.  You will receive a reminder letter in the mail two months in advance. If you don't receive a letter, please call our office to schedule the follow-up appointment.   Any Other Special Instructions Will Be Listed Below (If Applicable).     If you need a refill on your cardiac medications before your next appointment, please call your pharmacy.

## 2016-07-13 NOTE — Progress Notes (Signed)
Cardiology Office Note Date:  07/13/2016   ID:  [name], DOB [birth year], MRN [id number]  PCP:  Dorrene GermanEdwin A Avbuere, MD  Cardiologist:  Tonny Bollmanooper, Lanitra Battaglini, MD    Chief Complaint  Patient presents with  . Coronary Artery Disease     History of Present Illness: [name] is a 75 y.o. female who presents for follow-up of coronary artery disease and chronic systolic heart failure. The patient underwent multivessel CABG in 2009 after presenting with non-ST elevation infarction in the setting of respiratory failure. The patient's LVEF last assessed in 2014 was 35-40%. Other medical problems include hypertension and hyperlipidemia.  Here alone today. Now ambulating with a cane or a walker because of weakness in her legs and unsteadiness.   She complains of shortness of breath with exertion, NYHA Class II-III symptoms. No edema, orthopnea, or PND. No palpitations, lightheadedness, or syncope.   Past Medical History:  Diagnosis Date  . CAD (coronary artery disease)    s/p NSTEMI 2009 - s/p CABG 2009: L-LAD, S-OM1 and OM2, S-Dx, S-PDA.;  Myoview 11/04/11:  Small inf scar, mod ant apical and septal scar, no ischemia, EF 31%.  . Cardiomyopathy, ischemic    a.  LVEF 35-40%;  b. Echo 5/14: Moderate LVH, EF 30-35%, mid to distal anteroseptal and apical HK  . Chronic systolic heart failure   . HIT (heparin-induced thrombocytopenia)   . HLD (hyperlipidemia)   . HTN (hypertension)   . Other emphysema   . Shortness of breath   . Thrombocytopenia     Past Surgical History:  Procedure Laterality Date  . CORONARY ARTERY BYPASS GRAFT  November 21, 2007   5 vessel    Current Outpatient Prescriptions  Medication Sig Dispense Refill  . acetaminophen-codeine (TYLENOL #3) 300-30 MG per tablet Take 1 tablet by mouth 2 (two) times daily as needed for severe pain.     [name]. albuterol (PROVENTIL HFA;VENTOLIN HFA) 108 (90 BASE) MCG/ACT inhaler 1-2 puffs every 6 (six) hours as needed (wheezing). 1-2 puffs  every 4-6 hours as needed    . allopurinol (ZYLOPRIM) 100 MG tablet Take 100 mg by mouth daily.     [name]. aspirin 81 MG tablet Take 81 mg by mouth daily.     [name]. atorvastatin (LIPITOR) 80 MG tablet Take 1 tablet (80 mg total) by mouth daily. 30 tablet 11  . carvedilol (COREG) 6.25 MG tablet Take 1 tablet (6.25 mg total) by mouth 2 (two) times daily with a meal. 180 tablet 3  . colchicine 0.6 MG tablet Take 0.6 mg by mouth 2 (two) times daily as needed. For gout    . furosemide (LASIX) 20 MG tablet Take 1 tablet (20 mg total) by mouth daily. 30 tablet 11  . isosorbide-hydrALAZINE (BIDIL) 20-37.5 MG per tablet Take 1 tablet by mouth 3 (three) times daily.    [name]. lisinopril (PRINIVIL,ZESTRIL) 10 MG tablet TAKE 1 TABLET BY MOUTH EVERY DAY 90 tablet 11  . Multiple Vitamin (MULTIVITAMIN) tablet Take 1 tablet by mouth daily.     [name]. omeprazole (PRILOSEC) 10 MG capsule Take 1 capsule (10 mg total) by mouth daily. 30 capsule 3  . potassium chloride SA (K-DUR,KLOR-CON) 20 MEQ tablet TAKE 1 TABLET BY MOUTH DAILY 30 tablet 9  . spironolactone (ALDACTONE) 25 MG tablet Take 1 tablet (25 mg total) by mouth daily. 90 tablet 3  . albuterol (PROVENTIL) (5 MG/ML) 0.5% nebulizer solution Take 0.5 mLs (2.5 mg total) by nebulization every 2 (two) hours as needed  for wheezing or shortness of breath. 20 mL 0  . budesonide-formoterol (SYMBICORT) 160-4.5 MCG/ACT inhaler Inhale 2 puffs into the lungs 2 (two) times daily. 3 Inhaler 3  . ipratropium (ATROVENT) 0.02 % nebulizer solution Take 2.5 mLs (0.5 mg total) by nebulization 4 (four) times daily as needed for wheezing. 75 mL 0  . nitroGLYCERIN (NITROSTAT) 0.4 MG SL tablet Place 1 tablet (0.4 mg total) under the tongue every 5 (five) minutes as needed for chest pain. 25 tablet 3  . tiotropium (SPIRIVA HANDIHALER) 18 MCG inhalation capsule Place 1 capsule (18 mcg total) into inhaler and inhale daily. 90 capsule 3   No current facility-administered medications for this visit.      Allergies:   Review of patient's allergies indicates no known allergies.   Social History:  The patient  reports that she quit smoking about 8 years ago. Her smoking use included Cigarettes. She has a 44.00 pack-year smoking history. She does not have any smokeless tobacco history on file. She reports that she does not drink alcohol or use drugs.   Family History:  The patient's  family history includes Asthma in her mother; Diabetes in her mother; Hypertension in her mother; Rheum arthritis in her mother.   ROS:  Please see the history of present illness.  Otherwise, review of systems is positive for DOE, decreased appetite.  All other systems are reviewed and negative.   PHYSICAL EXAM: VS:  BP 116/72   Pulse 92   Ht 5\' 5"  (1.651 m)   Wt 206 lb 12.8 oz (93.8 kg)   BMI 34.41 kg/m  , BMI Body mass index is 34.41 kg/m. GEN: Well nourished, well developed, in no acute distress  HEENT: normal  Neck: no JVD, no masses. No carotid bruits Cardiac: RRR without murmur or gallop                Respiratory:  clear to auscultation bilaterally, normal work of breathing GI: soft, nontender, nondistended, + BS MS: no deformity or atrophy  Ext: no pretibial edema Skin: warm and dry, no rash Neuro:  Strength and sensation are intact Psych: euthymic mood, full affect  EKG:  EKG is ordered today. The ekg ordered today shows sinus rhythm 95 bpm, inferior infarct age undetermined, anterolateral infarct age undetermined.  Recent Labs: No results found for requested labs within last 8760 hours.   Lipid Panel     Component Value Date/Time   CHOL 75 01/29/2014 1137   TRIG 106.0 01/29/2014 1137   HDL 24.40 (L) 01/29/2014 1137   CHOLHDL 3 01/29/2014 1137   VLDL 21.2 01/29/2014 1137   LDLCALC 29 01/29/2014 1137      Wt Readings from Last 3 Encounters:  07/13/16 206 lb 12.8 oz (93.8 kg)  07/11/15 208 lb 6.4 oz (94.5 kg)  07/03/14 197 lb (89.4 kg)    ASSESSMENT AND PLAN: 1.  Chronic  systolic heart failure, NYHA functional class III: Patient has significant limitation but suspect this is multifactorial and she has COPD and chronic weakness. On physical exam there are no signs of volume excess. Her medications are reviewed and include carvedilol, furosemide, isosorbide/hydralazine, lisinopril, and LDL back down. Will update a metabolic program to evaluate renal function, BNP, and potassium. Current medicines will be continued without change.  2. Coronary artery disease, native vessel, without symptoms of angina: Patient is status post CABG. Will continue same medications.  3. Hyperlipidemia: She continues on a high intensity statin drug with atorvastatin 80 mg daily.  4. Hypertension with heart failure: Blood pressure is well controlled.  Current medicines are reviewed with the patient today.  The patient does not have concerns regarding medicines.  Labs/ tests ordered today include:   Orders Placed This Encounter  Procedures  . Basic metabolic panel  . B Nat Peptide    Disposition:   FU APP in 6 months  Signed, Tonny Bollman, MD  07/13/2016 1:24 PM    [facility] [address] [place], [place], [place]  [address] Phone: [phone]; Fax: [phone]

## 2016-07-14 ENCOUNTER — Other Ambulatory Visit: Payer: Self-pay

## 2016-07-14 DIAGNOSIS — I5022 Chronic systolic (congestive) heart failure: Secondary | ICD-10-CM

## 2016-07-15 ENCOUNTER — Other Ambulatory Visit: Payer: Self-pay | Admitting: Cardiovascular Disease

## 2016-07-15 ENCOUNTER — Other Ambulatory Visit: Payer: Self-pay

## 2016-07-15 MED ORDER — FUROSEMIDE 40 MG PO TABS
40.0000 mg | ORAL_TABLET | Freq: Every day | ORAL | 11 refills | Status: DC
Start: 2016-07-15 — End: 2016-07-15

## 2016-07-21 ENCOUNTER — Other Ambulatory Visit: Payer: Self-pay | Admitting: Cardiovascular Disease

## 2016-07-22 ENCOUNTER — Other Ambulatory Visit: Payer: Self-pay

## 2016-07-22 ENCOUNTER — Other Ambulatory Visit: Payer: Self-pay | Admitting: Cardiovascular Disease

## 2016-07-22 MED ORDER — ATORVASTATIN CALCIUM 80 MG PO TABS: 80.0000 mg | ORAL_TABLET | Freq: Every day | ORAL | 11 refills | Status: DC

## 2016-08-04 ENCOUNTER — Other Ambulatory Visit: Payer: Self-pay | Admitting: Cardiovascular Disease

## 2016-08-04 NOTE — Telephone Encounter (Signed)
spironolactone (ALDACTONE) 25 MG tablet  Medication  Date: 07/13/2016 Department[facility] [address] Office Ordering/Authorizing: Tonny BollmanMichael Cooper, MD  Order Providers   Prescribing Provider Encounter Provider  Tonny BollmanMichael Cooper, MD Tonny BollmanMichael Cooper, MD  Medication Detail    Disp Refills Start End   spironolactone (ALDACTONE) 25 MG tablet 90 tablet 3 07/13/2016    Sig - Route: Take 1 tablet (25 mg total) by mouth daily. - Oral   E-Prescribing Status: Receipt confirmed by pharmacy (07/13/2016 12:59 PM EDT)   Associated Diagnoses   DYSPNEA     Chronic systolic heart failure (HCC)     Pharmacy   [facility] [id number] - [place], [place] - [address] AT [address]

## 2016-10-17 ENCOUNTER — Other Ambulatory Visit: Payer: Self-pay | Admitting: Cardiovascular Disease

## 2016-10-29 ENCOUNTER — Other Ambulatory Visit: Payer: Self-pay | Admitting: Cardiovascular Disease

## 2016-12-31 ENCOUNTER — Encounter: Payer: Self-pay | Admitting: Physician Assistant

## 2017-01-09 ENCOUNTER — Emergency Department (HOSPITAL_COMMUNITY)
Admission: EM | Admit: 2017-01-09 | Discharge: 2017-01-09 | Disposition: A | Discharge status: Home / Self Care | Payer: Medicare Other | Attending: Emergency Medicine | Admitting: Emergency Medicine

## 2017-01-09 ENCOUNTER — Encounter (HOSPITAL_COMMUNITY): Payer: Self-pay | Admitting: Emergency Medicine

## 2017-01-09 DIAGNOSIS — H5789 Other specified disorders of eye and adnexa: Secondary | ICD-10-CM

## 2017-01-09 DIAGNOSIS — Z87891 Personal history of nicotine dependence: Secondary | ICD-10-CM | POA: Insufficient documentation

## 2017-01-09 DIAGNOSIS — L538 Other specified erythematous conditions: Secondary | ICD-10-CM | POA: Diagnosis not present

## 2017-01-09 DIAGNOSIS — I2510 Atherosclerotic heart disease of native coronary artery without angina pectoris: Secondary | ICD-10-CM | POA: Diagnosis not present

## 2017-01-09 DIAGNOSIS — I10 Essential (primary) hypertension: Secondary | ICD-10-CM | POA: Insufficient documentation

## 2017-01-09 DIAGNOSIS — R220 Localized swelling, mass and lump, head: Secondary | ICD-10-CM | POA: Diagnosis present

## 2017-01-09 DIAGNOSIS — Z7982 Long term (current) use of aspirin: Secondary | ICD-10-CM | POA: Insufficient documentation

## 2017-01-09 DIAGNOSIS — Z79899 Other long term (current) drug therapy: Secondary | ICD-10-CM | POA: Insufficient documentation

## 2017-01-09 DIAGNOSIS — T7840XA Allergy, unspecified, initial encounter: Secondary | ICD-10-CM

## 2017-01-09 MED ORDER — PREDNISOLONE ACETATE 1 % OP SUSP
1.0000 [drp] | Freq: Four times a day (QID) | OPHTHALMIC | Status: DC
  Administered 2017-01-09: 1 [drp] via OPHTHALMIC
  Filled 2017-01-09: qty 1

## 2017-01-09 MED ORDER — FAMOTIDINE 20 MG PO TABS: 20.0000 mg | ORAL_TABLET | Freq: Two times a day (BID) | ORAL | 0 refills | Status: DC

## 2017-01-09 MED ORDER — FAMOTIDINE 20 MG PO TABS
20.0000 mg | ORAL_TABLET | Freq: Once | ORAL | Status: AC
Start: 2017-01-09 — End: 2017-01-09
  Administered 2017-01-09: 20 mg via ORAL
  Filled 2017-01-09: qty 1

## 2017-01-09 NOTE — Discharge Instructions (Signed)
As discussed, your evaluation today has been largely reassuring.  But, it is important that you monitor your condition carefully, and do not hesitate to return to the ED if you develop new, or concerning changes in your condition.   Please use the provided eyedrops 4 times daily for the next 3 days. In addition, use the prescribed antihistamines as described.  It is important that you follow-up with your ophthalmologist.

## 2017-01-09 NOTE — ED Notes (Signed)
Pt A&OX4 ambulatory with steady gait NAD but wheeled out of ED via wheelchair

## 2017-01-09 NOTE — ED Provider Notes (Signed)
[facility] DEPT Provider Note   CSN: [id number] Arrival date & time: 01/09/17  1433     History   Chief Complaint Chief Complaint  Patient presents with  . Eye Problem  . Facial Swelling    HPI [name] is a 76 y.o. female.  HPI Patient presents with concern of bilateral eye itchiness, erythema, drainage. Symptoms began yesterday, initially with itchiness, bilaterally. However, over the past 12 hours in particular, after awakening this morning, patient has noticed increasing erythema, swelling about both periorbital areas. No other new concerns, including no headache, no nausea, no vision changes. Patient notes a history of allergies, has had similar prior events, previously treated with ophthalmologic prednisone. Patient has multiple other medical issues, saw her physician 2 days ago, had she says an unremarkable checkup. No new medication changes. During this illness, no clear alleviating or exacerbating factors, no clear precipitating factors either.  Past Medical History:  Diagnosis Date  . CAD (coronary artery disease)    s/p NSTEMI 2009 - s/p CABG 2009: L-LAD, S-OM1 and OM2, S-Dx, S-PDA.;  Myoview 11/04/11:  Small inf scar, mod ant apical and septal scar, no ischemia, EF 31%.  . Cardiomyopathy, ischemic    a.  LVEF 35-40%;  b. Echo 5/14: Moderate LVH, EF 30-35%, mid to distal anteroseptal and apical HK  . Chronic systolic heart failure (HCC)   . HIT (heparin-induced thrombocytopenia) (HCC)   . HLD (hyperlipidemia)   . HTN (hypertension)   . Other emphysema (HCC)   . Shortness of breath   . Thrombocytopenia [facility])     Patient Active Problem List   Diagnosis Date Noted  . Ischemic cardiomyopathy 03/23/2013  . Dyspepsia 10/06/2011  . Chronic systolic heart failure (HCC) 08/15/2009  . EMPHYSEMA 04/15/2009  . HYPERLIPIDEMIA-MIXED 11/25/2008  . HYPERTENSION, BENIGN 11/25/2008  . CAD 11/25/2008    Past Surgical History:  Procedure Laterality Date  .  CORONARY ARTERY BYPASS GRAFT  November 21, 2007   5 vessel    OB History    No data available       Home Medications    Prior to Admission medications   Medication Sig Start Date End Date Taking? Authorizing Provider  acetaminophen-codeine (TYLENOL #3) 300-30 MG per tablet Take 1 tablet by mouth 2 (two) times daily as needed for severe pain.  06/26/14   Historical Provider, MD  albuterol (PROVENTIL HFA;VENTOLIN HFA) 108 (90 BASE) MCG/ACT inhaler 1-2 puffs every 6 (six) hours as needed (wheezing). 1-2 puffs every 4-6 hours as needed 01/08/11   Barbaraann Share, MD  albuterol (PROVENTIL) (5 MG/ML) 0.5% nebulizer solution Take 0.5 mLs (2.5 mg total) by nebulization every 2 (two) hours as needed for wheezing or shortness of breath. 09/16/11 01/15/14  Shanker Levora Dredge, MD  allopurinol (ZYLOPRIM) 100 MG tablet Take 100 mg by mouth daily.     Historical Provider, MD  aspirin 81 MG tablet Take 81 mg by mouth daily.     Historical Provider, MD  atorvastatin (LIPITOR) 80 MG tablet Take 1 tablet (80 mg total) by mouth daily. 07/22/16   Tonny Bollman, MD  BIDIL 20-37.5 MG tablet TAKE 1 TABLET BY MOUTH THREE TIMES DAILY 07/22/16   Tonny Bollman, MD  budesonide-formoterol [facility]) 160-4.5 MCG/ACT inhaler Inhale 2 puffs into the lungs 2 (two) times daily. 02/25/12 04/13/14  Barbaraann Share, MD  carvedilol (COREG) 6.25 MG tablet Take 1 tablet (6.25 mg total) by mouth 2 (two) times daily with a meal. 07/13/16   Tonny Bollman,  MD  colchicine 0.6 MG tablet Take 0.6 mg by mouth 2 (two) times daily as needed. For gout    Historical Provider, MD  furosemide (LASIX) 40 MG tablet TAKE 1 TABLET BY MOUTH DAILY 07/15/16   Tonny Bollman, MD  ipratropium (ATROVENT) 0.02 % nebulizer solution Take 2.5 mLs (0.5 mg total) by nebulization 4 (four) times daily as needed for wheezing. 09/16/11 01/15/14  Shanker Levora Dredge, MD  lisinopril (PRINIVIL,ZESTRIL) 10 MG tablet TAKE 1 TABLET BY MOUTH EVERY DAY 10/29/16   Tonny Bollman, MD    Multiple Vitamin (MULTIVITAMIN) tablet Take 1 tablet by mouth daily.     Historical Provider, MD  nitroGLYCERIN (NITROSTAT) 0.4 MG SL tablet PLACE 1 TABLET UNDER TONGUE EVERY 5 MINUTES AS NEEDED FOR CHEST PAIN 07/14/16   Tonny Bollman, MD  omeprazole (PRILOSEC) 10 MG capsule Take 1 capsule (10 mg total) by mouth daily. 01/15/14   Beatrice Lecher, PA-C  potassium chloride SA (K-DUR,KLOR-CON) 20 MEQ tablet TAKE 1 TABLET BY MOUTH DAILY 10/20/16   Tonny Bollman, MD  spironolactone (ALDACTONE) 25 MG tablet Take 1 tablet (25 mg total) by mouth daily. 07/13/16   Tonny Bollman, MD  tiotropium (SPIRIVA HANDIHALER) 18 MCG inhalation capsule Place 1 capsule (18 mcg total) into inhaler and inhale daily. 02/25/12 04/13/14  Barbaraann Share, MD    Family History Family History  Problem Relation Age of Onset  . Asthma Mother   . Hypertension Mother   . Diabetes Mother   . Rheum arthritis Mother     Social History Social History  Substance Use Topics  . Smoking status: Former Smoker    Packs/day: 1.00    Years: 44.00    Types: Cigarettes    Quit date: 10/20/2007  . Smokeless tobacco: Never Used     Comment: started at age 56s.   . Alcohol use No     Allergies   Patient has no known allergies.   Review of Systems Review of Systems  Constitutional:       Per HPI, otherwise negative  HENT:       Per HPI, otherwise negative  Eyes: Positive for discharge, redness and itching. Negative for photophobia, pain and visual disturbance.  Respiratory:       Per HPI, otherwise negative  Cardiovascular:       Per HPI, otherwise negative  Gastrointestinal: Negative for vomiting.  Endocrine:       Negative aside from HPI  Genitourinary:       Neg aside from HPI   Musculoskeletal:       Per HPI, otherwise negative  Skin: Negative.   Neurological: Negative for syncope and headaches.     Physical Exam Updated Vital Signs BP (!) 152/74 (BP Location: Left Arm)   Pulse 72   Temp 98.5 F (36.9 C)  (Oral)   Resp 17   Ht 5\' 5"  (1.651 m)   Wt 199 lb (90.3 kg)   SpO2 97%   BMI 33.12 kg/m   Physical Exam  Constitutional: She is oriented to person, place, and time. She appears well-developed and well-nourished. No distress.  HENT:  Head: Normocephalic and atraumatic.  Eyes:  Pupils are equal round reactive to light, extraocular motion is unremarkable. On the left greater than the right there is notable.  Orbital edema, erythema, and conjunctival discharge. No tenderness to palpation.  Cardiovascular: Normal rate and regular rhythm.   Pulmonary/Chest: Effort normal and breath sounds normal. No stridor. No respiratory distress.  Abdominal: She exhibits  no distension.  Musculoskeletal: She exhibits no edema.  Neurological: She is alert and oriented to person, place, and time. She displays no atrophy and no tremor. No cranial nerve deficit. She exhibits normal muscle tone. She displays no seizure activity. Coordination normal.  Skin: Skin is warm and dry.  Psychiatric: She has a normal mood and affect.  Nursing note and vitals reviewed.    ED Treatments / Results   Procedures Procedures (including critical care time)  Medications Ordered in ED Medications  famotidine (PEPCID) tablet 20 mg (not administered)  prednisoLONE acetate (PRED FORTE) 1 % ophthalmic suspension 1 drop (not administered)     Initial Impression / Assessment and Plan / ED Course  I have reviewed the triage vital signs and the nursing notes.  Pertinent labs & imaging results that were available during my care of the patient were reviewed by me and considered in my medical decision making (see chart for details).   elderly female presents with concern of bilateral eye itchiness, discharge, surrounding erythema. Here she is awake, alert, in no distress, she is neurologically intact, with no evidence for intracranial lesions. Patient is a history of atopy, and this presentation likely is inflammatory,  possibly allergic reaction. With no evidence for vision loss, no evidence for acute glaucoma, no evidence for intraocular pathology, patient started course of topical steroids, oral anti-histamines discharged in stable condition to follow-up with her ophthalmologist.  Final Clinical Impressions(s) / ED Diagnoses  Periorbital erythema   Gerhard Munchobert Alaura Schippers, MD 01/09/17 1755

## 2017-01-09 NOTE — ED Triage Notes (Signed)
Pt. Stated, My left eye was itching yesterday and when I woke up this morning it was swollen and painful

## 2017-01-11 ENCOUNTER — Encounter (INDEPENDENT_AMBULATORY_CARE_PROVIDER_SITE_OTHER): Payer: Self-pay

## 2017-01-11 ENCOUNTER — Ambulatory Visit (INDEPENDENT_AMBULATORY_CARE_PROVIDER_SITE_OTHER): Payer: Medicare Other | Admitting: Physician Assistant

## 2017-01-11 ENCOUNTER — Encounter: Payer: Self-pay | Admitting: Physician Assistant

## 2017-01-11 VITALS — BP 110/60 | HR 83 | Ht 65.0 in | Wt 198.0 lb

## 2017-01-11 DIAGNOSIS — E784 Other hyperlipidemia: Secondary | ICD-10-CM | POA: Diagnosis not present

## 2017-01-11 DIAGNOSIS — I10 Essential (primary) hypertension: Secondary | ICD-10-CM

## 2017-01-11 DIAGNOSIS — J449 Chronic obstructive pulmonary disease, unspecified: Secondary | ICD-10-CM

## 2017-01-11 DIAGNOSIS — I5022 Chronic systolic (congestive) heart failure: Secondary | ICD-10-CM

## 2017-01-11 DIAGNOSIS — I2510 Atherosclerotic heart disease of native coronary artery without angina pectoris: Secondary | ICD-10-CM

## 2017-01-11 DIAGNOSIS — E7849 Other hyperlipidemia: Secondary | ICD-10-CM

## 2017-01-11 NOTE — Patient Instructions (Addendum)
Medication Instructions:  Your physician recommends that you continue on your current medications as directed. Please refer to the Current Medication list given to you today.  Labwork: TODAY LIPID AND CMET  Testing/Procedures: NONE ORDERED  Follow-Up: Your physician wants you to follow-up in: 6 MONTHS WITH DR. Theodoro ParmaOOPER You will receive a reminder letter in the mail two months in advance. If you don't receive a letter, please call our office to schedule the follow-up appointment.  Any Other Special Instructions Will Be Listed Below (If Applicable).  If you need a refill on your cardiac medications before your next appointment, please call your pharmacy.

## 2017-01-11 NOTE — Progress Notes (Addendum)
Cardiology Office Note:    Date:  01/11/2017   ID:  [name], DOB [birth year], MRN [id number]  PCP:  Dorrene GermanEdwin A Avbuere, MD  Cardiologist:  Dr. Tonny BollmanMichael Cooper   Electrophysiologist:  Dr. Sherryl MangesSteven Klein   Referring MD: Fleet ContrasAvbuere, Edwin, MD   Chief Complaint  Patient presents with  . Congestive Heart Failure    Follow up  . Coronary Artery Disease    Follow up    History of Present Illness:    [name] is a 76 y.o. female with a hx of CAD, s/p CABG in 2009, ICM, systolic CHF, HTN, HLP, COPD, chronic dyspnea, thrombocytopenia. 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 She initially presented with an NSTEMI. Subsequent bypass included a L-LAD, S-OM1 and OM2, S-Dx, S-PDA.  She was admitted in 08/2011 with community acquired pneumonia versus COPD exacerbation.  EF remained < 35% and she was set up to see Dr. Graciela HusbandsKlein for consideration of ICD implantation. His recommendation was to further titrate medical Rx first.  Her EF improved to 35-40% and no ICD was felt to be necessary.  She was last seen by Dr. Excell Seltzerooper in 9/17.[name]. She had significant limitation. However, this was felt to be multifactorial and related to COPD and chronic weakness. BNP was mildly elevated. Her furosemide was increased.   She returns for cardiology follow-up.  She is here alone.  Since last seen, she denies chest pain, syncope, orthopnea, PND, edema, weight gain.  Her BP at home is usually optimal.  She has chronic dyspnea on exertion without change.  She denies cough or wheezing.  She went to the ED over the weekend for allergies which have manifested with periorbital edema.  This is improved.    Prior CV studies:   The following studies were reviewed today:  Echo (06/23/13):   Mild focal basal septal hypertrophy, EF 35-40%, anteroseptal and apical HK, grade 2 diastolic dysfunction, mild MR, reduced RV systolic function, moderate TR, PASP 47.      Echocardiogram (5/14):  Moderate LVH,  EF 30-35%, anteroseptal and apical HK.   Myoview (11/04/11):   Small inf scar, mod ant apical and septal scar, no ischemia, EF 31%.  Med Rx continued.     Past Medical History:  Diagnosis Date  . CAD (coronary artery disease)    s/p NSTEMI 2009 - s/p CABG 2009: L-LAD, S-OM1 and OM2, S-Dx, S-PDA.;  Myoview 11/04/11:  Small inf scar, mod ant apical and septal scar, no ischemia, EF 31%.  . Cardiomyopathy, ischemic    a.  LVEF 35-40% // b. Echo 5/14: Moderate LVH, EF 30-35%, mid to distal ant-septal, apical HK // c. Echo  9/14: Mild focal basal septal hypertrophy, EF 35-40%, anteroseptal and apical HK, grade 2 diastolic dysfunction, mild MR, reduced RV systolic function, moderate TR, PASP 47.      [name]. Chronic systolic heart failure (HCC)   . HIT (heparin-induced thrombocytopenia) (HCC)   . HLD (hyperlipidemia)   . HTN (hypertension)   . Other emphysema (HCC)     Past Surgical History:  Procedure Laterality Date  . CORONARY ARTERY BYPASS GRAFT  November 21, 2007   5 vessel    Current Medications: Current Meds  Medication Sig  . acetaminophen-codeine (TYLENOL #3) 300-30 MG per tablet Take 1 tablet by mouth 2 (two) times daily as needed for severe pain.   [name]. albuterol (PROVENTIL HFA;VENTOLIN HFA) 108 (90 BASE) MCG/ACT inhaler 1-2 puffs every 6 (six) hours as needed (  wheezing). 1-2 puffs every 4-6 hours as needed  . albuterol (PROVENTIL) (5 MG/ML) 0.5% nebulizer solution Take 0.5 mLs (2.5 mg total) by nebulization every 2 (two) hours as needed for wheezing or shortness of breath.  . allopurinol (ZYLOPRIM) 100 MG tablet Take 100 mg by mouth daily.   [name] aspirin 81 MG tablet Take 81 mg by mouth daily.   [name] atorvastatin (LIPITOR) 80 MG tablet Take 1 tablet (80 mg total) by mouth daily.  [name] BIDIL 20-37.5 MG tablet TAKE 1 TABLET BY MOUTH THREE TIMES DAILY  . budesonide-formoterol (SYMBICORT) 160-4.5 MCG/ACT inhaler Inhale 2 puffs into the lungs 2 (two) times daily.  . carvedilol (COREG) 6.25 MG tablet  Take 1 tablet (6.25 mg total) by mouth 2 (two) times daily with a meal.  . colchicine 0.6 MG tablet Take 0.6 mg by mouth 2 (two) times daily as needed. For gout  . famotidine (PEPCID) 20 MG tablet Take 1 tablet (20 mg total) by mouth 2 (two) times daily. Take one tablet twice daily for two days  . furosemide (LASIX) 40 MG tablet TAKE 1 TABLET BY MOUTH DAILY  . ipratropium (ATROVENT) 0.02 % nebulizer solution Take 2.5 mLs (0.5 mg total) by nebulization 4 (four) times daily as needed for wheezing.  [name] lisinopril (PRINIVIL,ZESTRIL) 10 MG tablet TAKE 1 TABLET BY MOUTH EVERY DAY  . Multiple Vitamin (MULTIVITAMIN) tablet Take 1 tablet by mouth daily.   . nitroGLYCERIN (NITROSTAT) 0.4 MG SL tablet PLACE 1 TABLET UNDER TONGUE EVERY 5 MINUTES AS NEEDED FOR CHEST PAIN  . omeprazole (PRILOSEC) 10 MG capsule Take 1 capsule (10 mg total) by mouth daily.  . potassium chloride SA (K-DUR,KLOR-CON) 20 MEQ tablet TAKE 1 TABLET BY MOUTH DAILY  . prednisoLONE sodium phosphate (INFLAMASE FORTE) 1 % ophthalmic solution Place 1 drop into both eyes 4 (four) times daily.  [name] spironolactone (ALDACTONE) 25 MG tablet Take 1 tablet (25 mg total) by mouth daily.  [name] tiotropium (SPIRIVA HANDIHALER) 18 MCG inhalation capsule Place 1 capsule (18 mcg total) into inhaler and inhale daily.     Allergies:   Patient has no known allergies.   Social History   Social History  . Marital status: Legally Separated    Spouse name: N/A  . Number of children: N/A  . Years of education: N/A   Occupational History  . does not currently work    Social History Main Topics  . Smoking status: Former Smoker    Packs/day: 1.00    Years: 44.00    Types: Cigarettes    Quit date: 10/20/2007  . Smokeless tobacco: Never Used     Comment: started at age 70s.   . Alcohol use No  . Drug use: No  . Sexual activity: Not Asked   Other Topics Concern  . None   Social History Narrative   Pt lives alone in [place].    Children live nearby       Family History  Problem Relation Age of Onset  . Asthma Mother   . Hypertension Mother   . Diabetes Mother   . Rheum arthritis Mother      ROS:   Please see the history of present illness.    Review of Systems  Eyes: Positive for visual disturbance.       Facial swelling  Musculoskeletal: Positive for back pain.  Neurological: Positive for loss of balance.   All other systems reviewed and are negative.   EKGs/Labs/Other Test Reviewed:    EKG:  EKG is  ordered today.  The ekg ordered today demonstrates NSR, HR 83, LAD, ant-lat Q waves, QTc 455 ms, no change since prior tracing.   Recent Labs: 07/13/2016: Brain Natriuretic Peptide 211.9; BUN 13; Creat 1.12; Potassium 4.3; Sodium 143   Lab Results  Component Value Date   PLT 106.0 (L) 05/25/2013    Recent Lipid Panel    Component Value Date/Time   CHOL 75 01/29/2014 1137   TRIG 106.0 01/29/2014 1137   HDL 24.40 (L) 01/29/2014 1137   CHOLHDL 3 01/29/2014 1137   VLDL 21.2 01/29/2014 1137   LDLCALC 29 01/29/2014 1137    Physical Exam:    VS:  BP 110/60   Pulse 83   Ht 5\' 5"  (1.651 m)   Wt 198 lb (89.8 kg)   SpO2 92%   BMI 32.95 kg/m     Wt Readings from Last 3 Encounters:  01/11/17 198 lb (89.8 kg)  01/09/17 199 lb (90.3 kg)  07/13/16 206 lb 12.8 oz (93.8 kg)     Physical Exam  Constitutional: She is oriented to person, place, and time. She appears well-developed and well-nourished. No distress.  HENT:  Head: Normocephalic and atraumatic.  Eyes: No scleral icterus.  Small area of edema noted superior nasal quadrant  Neck: Normal range of motion. No JVD present.  Cardiovascular: Normal rate, regular rhythm, S1 normal, S2 normal and normal heart sounds.   No murmur heard. Pulmonary/Chest: She has decreased breath sounds. She has no wheezes. She has no rhonchi. She has no rales.  Abdominal: Soft. There is no tenderness.  Musculoskeletal: She exhibits no edema.  Neurological: She is alert and oriented  to person, place, and time.  Skin: Skin is warm and dry.  Psychiatric: She has a normal mood and affect.    ASSESSMENT:    1. Chronic systolic heart failure (HCC)   2. Coronary artery disease involving native coronary artery of native heart without angina pectoris   3. HYPERTENSION, BENIGN   4. Other hyperlipidemia   5. Chronic obstructive pulmonary disease, unspecified COPD type (HCC)    PLAN:    In order of problems listed above:  1. Chronic systolic heart failure (HCC) -  2/2 Ischemic CM.  EF has improved to 35-40 with medical Rx.  She is overall stable with NYHA 3 symptoms.  Dyspnea is multifactorial related to CHF, CAD, COPD, obesity, deconditioning.  Volume appears stable.   -  Continue beta-blocker, ACE inhibitor, spironolactone, hydralazine/nitrates  -  Continue current dose of furosemide.    -  CMET today  2 Coronary artery disease involving native coronary artery of native heart without angina pectoris -  s/p CABG in 2009.  Myoview in 2013 low risk.  Denies angina. Continue ASA, statin, beta-blocker.   3. HYPERTENSION, BENIGN -  BP controlled.   4. Other hyperlipidemia - Continue high dose statin.  Check Lipids, CMET today.   5. Chronic obstructive pulmonary disease, unspecified COPD type (HCC) -  Continue follow up with PCP.   Dispo:  Return in about 6 months (around 07/14/2017) for Routine Follow Up, w/ Dr. Excell Seltzer.   Medication Adjustments/Labs and Tests Ordered: Current medicines are reviewed at length with the patient today.  Concerns regarding medicines are outlined above.  Medication changes, Labs and Tests ordered today are outlined in the Patient Instructions noted below. Patient Instructions  Medication Instructions:  Your physician recommends that you continue on your current medications as directed. Please refer to the Current Medication list given  to you today.  Labwork: TODAY LIPID AND CMET  Testing/Procedures: NONE ORDERED  Follow-Up: Your  physician wants you to follow-up in: 6 MONTHS WITH DR. Theodoro Parma will receive a reminder letter in the mail two months in advance. If you don't receive a letter, please call our office to schedule the follow-up appointment.  Any Other Special Instructions Will Be Listed Below (If Applicable).  If you need a refill on your cardiac medications before your next appointment, please call your pharmacy.  Signed, Tereso Newcomer, PA-C  01/11/2017 12:41 PM    [facility] [address] [place], [place], [place]  [address] Phone: [phone]; Fax: [phone]

## 2017-01-12 ENCOUNTER — Telehealth: Payer: Self-pay | Admitting: *Deleted

## 2017-01-12 DIAGNOSIS — I2510 Atherosclerotic heart disease of native coronary artery without angina pectoris: Secondary | ICD-10-CM

## 2017-01-12 DIAGNOSIS — E7849 Other hyperlipidemia: Secondary | ICD-10-CM

## 2017-01-12 LAB — COMPREHENSIVE METABOLIC PANEL
ALK PHOS: 116 IU/L (ref 39–117)
ALT: 15 IU/L (ref 0–32)
AST: 18 IU/L (ref 0–40)
Albumin/Globulin Ratio: 1.2 (ref 1.2–2.2)
Albumin: 3.9 g/dL (ref 3.5–4.8)
BUN/Creatinine Ratio: 19 (ref 12–28)
BUN: 20 mg/dL (ref 8–27)
Bilirubin Total: 0.7 mg/dL (ref 0.0–1.2)
CO2: 26 mmol/L (ref 18–29)
CREATININE: 1.08 mg/dL — AB (ref 0.57–1.00)
Calcium: 9.2 mg/dL (ref 8.7–10.3)
Chloride: 101 mmol/L (ref 96–106)
GFR calc Af Amer: 57 mL/min/{1.73_m2} — ABNORMAL LOW (ref >59)
GFR calc non Af Amer: 50 mL/min/{1.73_m2} — ABNORMAL LOW (ref >59)
GLOBULIN, TOTAL: 3.2 g/dL (ref 1.5–4.5)
GLUCOSE: 115 mg/dL — AB (ref 65–99)
Potassium: 3.8 mmol/L (ref 3.5–5.2)
Sodium: 144 mmol/L (ref 134–144)
Total Protein: 7.1 g/dL (ref 6.0–8.5)

## 2017-01-12 LAB — LIPID PANEL
CHOLESTEROL TOTAL: 156 mg/dL (ref 100–199)
Chol/HDL Ratio: 4.6 ratio units — ABNORMAL HIGH (ref 0.0–4.4)
HDL: 34 mg/dL — AB (ref >39)
LDL CALC: 97 mg/dL (ref 0–99)
TRIGLYCERIDES: 127 mg/dL (ref 0–149)
VLDL Cholesterol Cal: 25 mg/dL (ref 5–40)

## 2017-01-12 NOTE — Telephone Encounter (Signed)
Pt notified of lab results. Pt agreeable to work on diet better and repeat FLP/LFT 07/19/17

## 2017-01-12 NOTE — Telephone Encounter (Signed)
-----   Message from Beatrice LecherScott T Weaver, [place] sent at 01/12/2017  5:24 PM EDT ----- Please call the patient Kidney function is stable. Liver enzymes normal. Lipids borderline controlled. Continue current dose of Lipitor. Work on diet to manage cholesterol (goal is LDL < 70). Repeat fasting Lipids in 6 mos.  Tereso NewcomerScott Weaver, PA-C   01/12/2017 5:24 PM

## 2017-04-15 ENCOUNTER — Other Ambulatory Visit: Payer: Self-pay | Admitting: Internal Medicine

## 2017-04-15 DIAGNOSIS — E2839 Other primary ovarian failure: Secondary | ICD-10-CM

## 2017-04-19 ENCOUNTER — Ambulatory Visit
Admission: RE | Admit: 2017-04-19 | Discharge: 2017-04-19 | Disposition: A | Discharge status: Home / Self Care | Payer: Medicare Other | Source: Ambulatory Visit | Attending: Internal Medicine | Admitting: Internal Medicine

## 2017-04-19 DIAGNOSIS — E2839 Other primary ovarian failure: Secondary | ICD-10-CM

## 2017-07-07 ENCOUNTER — Other Ambulatory Visit: Payer: Self-pay | Admitting: Cardiovascular Disease

## 2017-07-07 DIAGNOSIS — R0602 Shortness of breath: Secondary | ICD-10-CM

## 2017-07-07 DIAGNOSIS — I5022 Chronic systolic (congestive) heart failure: Secondary | ICD-10-CM

## 2017-07-12 ENCOUNTER — Other Ambulatory Visit: Payer: Self-pay | Admitting: Cardiovascular Disease

## 2017-07-14 ENCOUNTER — Encounter: Payer: Self-pay | Admitting: Cardiovascular Disease

## 2017-07-19 ENCOUNTER — Other Ambulatory Visit: Payer: Medicare Other

## 2017-07-19 DIAGNOSIS — E7849 Other hyperlipidemia: Secondary | ICD-10-CM

## 2017-07-19 DIAGNOSIS — I2510 Atherosclerotic heart disease of native coronary artery without angina pectoris: Secondary | ICD-10-CM

## 2017-07-19 LAB — LIPID PANEL
CHOLESTEROL TOTAL: 152 mg/dL (ref 100–199)
Chol/HDL Ratio: 4.5 ratio — ABNORMAL HIGH (ref 0.0–4.4)
HDL: 34 mg/dL — ABNORMAL LOW (ref >39)
LDL CALC: 85 mg/dL (ref 0–99)
TRIGLYCERIDES: 167 mg/dL — AB (ref 0–149)
VLDL CHOLESTEROL CAL: 33 mg/dL (ref 5–40)

## 2017-07-19 LAB — HEPATIC FUNCTION PANEL
ALT: 11 IU/L (ref 0–32)
AST: 11 IU/L (ref 0–40)
Albumin: 3.9 g/dL (ref 3.5–4.8)
Alkaline Phosphatase: 104 IU/L (ref 39–117)
Bilirubin Total: 0.6 mg/dL (ref 0.0–1.2)
Bilirubin, Direct: 0.16 mg/dL (ref 0.00–0.40)
Total Protein: 6.8 g/dL (ref 6.0–8.5)

## 2017-07-20 ENCOUNTER — Telehealth: Payer: Self-pay | Admitting: *Deleted

## 2017-07-20 DIAGNOSIS — E7849 Other hyperlipidemia: Secondary | ICD-10-CM

## 2017-07-20 MED ORDER — ROSUVASTATIN CALCIUM 40 MG PO TABS: 40.0000 mg | ORAL_TABLET | Freq: Every day | ORAL | 3 refills | Status: DC

## 2017-07-20 NOTE — Telephone Encounter (Signed)
-----   Message from Beatrice Lecher, [place] sent at 07/19/2017  9:55 PM EDT ----- Please call the patient LFTs normal. Lipids out of control.  LDL too high. Is she still taking Lipitor 80 mg? # If no, resume Lipitor 80 mg QD and repeat Lipids and LFTs in 3 months. # If yes, DC Lipitor and Start Crestor 40 mg QD and repeat Lipids and LFTs in 3 months.  Tereso Newcomer, PA-C    07/19/2017 9:55 PM

## 2017-07-20 NOTE — Telephone Encounter (Signed)
Pt has been notified of lab results and findings by phone with verbal understanding. Pt confirms she is still taking Lipitor. I advised pt per Tereso Newcomer, PA recommendation then to stop Lipitor and start Crestor 40 mg daily. New Rx has been sent in for Crestor. FLP/LFT 10/20/17. Went over some diet suggestions to try switching from simple carbs to complex carbs to help lessen Trigs. Watch fried, fatty foods to help lessen LDL. Pt is agreeable to plan of care and thanked me for my call today.

## 2017-07-26 ENCOUNTER — Ambulatory Visit (INDEPENDENT_AMBULATORY_CARE_PROVIDER_SITE_OTHER): Payer: Medicare Other | Admitting: Cardiovascular Disease

## 2017-07-26 ENCOUNTER — Encounter: Payer: Self-pay | Admitting: Cardiovascular Disease

## 2017-07-26 VITALS — BP 140/70 | HR 81 | Ht 64.0 in | Wt 192.8 lb

## 2017-07-26 DIAGNOSIS — M7989 Other specified soft tissue disorders: Secondary | ICD-10-CM

## 2017-07-26 DIAGNOSIS — I2510 Atherosclerotic heart disease of native coronary artery without angina pectoris: Secondary | ICD-10-CM

## 2017-07-26 DIAGNOSIS — I5022 Chronic systolic (congestive) heart failure: Secondary | ICD-10-CM | POA: Diagnosis not present

## 2017-07-26 MED ORDER — ROSUVASTATIN CALCIUM 40 MG PO TABS: 40.0000 mg | ORAL_TABLET | Freq: Every day | ORAL | 3 refills | Status: DC

## 2017-07-26 NOTE — Progress Notes (Signed)
Cardiology Office Note Date:  07/28/2017   ID:  [name], [name] 09-09-1941, MRN [id number]  PCP:  Fleet Contras, MD  Cardiologist:  Tonny Bollman, MD    Chief Complaint  Patient presents with  . Follow-up    CHF     History of Present Illness: [name] is a 76 y.o. female who presents for follow-up of chronic systolic heart failure and CAD s/p CABG.   She initially presented with an NSTEMI in 2009. Subsequent bypass included a L-LAD, S-OM1 and OM2, S-Dx, S-PDA.   She's here alone today. Continues to complain of shortness of breath with activity, but denies any change over the past year. She uses inhalers frequently and states that this helps. No chest pain or pressure. Complains of swelling in her left forearm. Occasional pain in this area.    Past Medical History:  Diagnosis Date  . CAD (coronary artery disease)    s/p NSTEMI 2009 - s/p CABG 2009: L-LAD, S-OM1 and OM2, S-Dx, S-PDA.;  Myoview 11/04/11:  Small inf scar, mod ant apical and septal scar, no ischemia, EF 31%.  . Cardiomyopathy, ischemic    a.  LVEF 35-40% // b. Echo 5/14: Moderate LVH, EF 30-35%, mid to distal ant-septal, apical HK // c. Echo  9/14: Mild focal basal septal hypertrophy, EF 35-40%, anteroseptal and apical HK, grade 2 diastolic dysfunction, mild MR, reduced RV systolic function, moderate TR, PASP 47.      [name] Chronic systolic heart failure (HCC)   . HIT (heparin-induced thrombocytopenia) (HCC)   . HLD (hyperlipidemia)   . HTN (hypertension)   . Other emphysema (HCC)     Past Surgical History:  Procedure Laterality Date  . CORONARY ARTERY BYPASS GRAFT  November 21, 2007   5 vessel    Current Outpatient Prescriptions  Medication Sig Dispense Refill  . acetaminophen-codeine (TYLENOL #3) 300-30 MG per tablet Take 1 tablet by mouth 2 (two) times daily as needed for severe pain.     [name] albuterol (PROVENTIL HFA;VENTOLIN HFA) 108 (90 BASE) MCG/ACT inhaler 1-2 puffs every 6 (six) hours as needed  (wheezing). 1-2 puffs every 4-6 hours as needed    . albuterol (PROVENTIL) (5 MG/ML) 0.5% nebulizer solution Take 2.5 mg by nebulization every 2 (two) hours as needed for wheezing or shortness of breath.    . allopurinol (ZYLOPRIM) 100 MG tablet Take 100 mg by mouth daily.     [name] aspirin 81 MG tablet Take 81 mg by mouth daily.     [name] BIDIL 20-37.5 MG tablet TAKE 1 TABLET BY MOUTH THREE TIMES DAILY 90 tablet 11  . budesonide-formoterol (SYMBICORT) 160-4.5 MCG/ACT inhaler Inhale 2 puffs into the lungs 2 (two) times daily.    . carvedilol (COREG) 6.25 MG tablet TAKE 1 TABLET BY MOUTH TWICE DAILY WITH A MEAL 180 tablet 0  . colchicine 0.6 MG tablet Take 0.6 mg by mouth 2 (two) times daily as needed. For gout    . famotidine (PEPCID) 20 MG tablet Take 1 tablet (20 mg total) by mouth 2 (two) times daily. Take one tablet twice daily for two days 10 tablet 0  . furosemide (LASIX) 40 MG tablet TAKE 1 TABLET BY MOUTH DAILY 90 tablet 0  . ipratropium (ATROVENT) 0.02 % nebulizer solution Take 0.5 mg by nebulization 4 (four) times daily.    [name] lisinopril (PRINIVIL,ZESTRIL) 10 MG tablet TAKE 1 TABLET BY MOUTH EVERY DAY 90 tablet 2  . Multiple Vitamin (MULTIVITAMIN) tablet Take 1 tablet  by mouth daily.     . nitroGLYCERIN (NITROSTAT) 0.4 MG SL tablet PLACE 1 TABLET UNDER TONGUE EVERY 5 MINUTES AS NEEDED FOR CHEST PAIN 75 tablet 0  . omeprazole (PRILOSEC) 10 MG capsule Take 1 capsule (10 mg total) by mouth daily. 30 capsule 3  . potassium chloride SA (K-DUR,KLOR-CON) 20 MEQ tablet Take 1 tablet (20 mEq total) by mouth daily. 90 tablet 1  . prednisoLONE sodium phosphate (INFLAMASE FORTE) 1 % ophthalmic solution Place 1 drop into both eyes 4 (four) times daily.    [name] spironolactone (ALDACTONE) 25 MG tablet Take 1 tablet (25 mg total) by mouth daily. 90 tablet 3  . tiotropium (SPIRIVA) 18 MCG inhalation capsule Place 18 mcg into inhaler and inhale daily.    . rosuvastatin (CRESTOR) 40 MG tablet Take 1 tablet (40 mg  total) by mouth daily. 90 tablet 3   No current facility-administered medications for this visit.     Allergies:   Patient has no known allergies.   Social History:  The patient  reports that she quit smoking about 9 years ago. Her smoking use included Cigarettes. She has a 44.00 pack-year smoking history. She has never used smokeless tobacco. She reports that she does not drink alcohol or use drugs.   Family History:  The patient's family history includes Asthma in her mother; Diabetes in her mother; Hypertension in her mother; Rheum arthritis in her mother.    ROS:  Please see the history of present illness.  Otherwise, review of systems is positive for back pain, leg pain, wheezing, constipation, balance problems (ambulates with a cane).  All other systems are reviewed and negative.    PHYSICAL EXAM: VS:  BP 140/70   Pulse 81   Ht  (1.626 m)   Wt 192 lb 12.8 oz (87.5 kg)   BMI 33.09 kg/m  , BMI Body mass index is 33.09 kg/m. GEN: Well nourished, well developed, in no acute distress  HEENT: normal  Neck: no JVD, no masses. No carotid bruits Cardiac: RRR without murmur or gallop                Respiratory:  clear to auscultation bilaterally, normal work of breathing GI: soft, nontender, nondistended, + BS MS: no deformity or atrophy  Ext: no pretibial edema, pedal pulses 2+= bilaterally. Left arm with an area of swelling along the medial aspect of the elbow, nontender, possible soft tissue swelling Skin: warm and dry, no rash Neuro:  Strength and sensation are intact Psych: euthymic mood, full affect  EKG:  EKG is ordered today. The ekg ordered today shows NSR with age-indeterminate anterior infarct, pulmonary disease pattern  Recent Labs: 01/11/2017: BUN 20; Creatinine, Ser 1.08; Potassium 3.8; Sodium 144 07/19/2017: ALT 11   Lipid Panel     Component Value Date/Time   CHOL 152 07/19/2017 1022   TRIG 167 (H) 07/19/2017 1022   HDL 34 (L) 07/19/2017 1022   CHOLHDL  4.5 (H) 07/19/2017 1022   CHOLHDL 3 01/29/2014 1137   VLDL 21.2 01/29/2014 1137   LDLCALC 85 07/19/2017 1022      Wt Readings from Last 3 Encounters:  07/26/17 192 lb 12.8 oz (87.5 kg)  01/11/17 198 lb (89.8 kg)  01/09/17 199 lb (90.3 kg)     Cardiac Studies Reviewed: 2D Echo 06-23-2013: Study Conclusions  - Left ventricle: The cavity size was normal. There was mild focal basal hypertrophy of the septum. Systolic function was moderately reduced. The estimated ejection fraction was  in the range of 35% to 40%. There is akinesis of the mid-distalanteroseptal and apical myocardium. There is akinesis of the mid-distalinferior myocardium. Features are consistent with a pseudonormal left ventricular filling pattern, with concomitant abnormal relaxation and increased filling pressure (grade 2 diastolic dysfunction). - Mitral valve: Mild regurgitation. - Right ventricle: Systolic function was reduced. - Tricuspid valve: Moderate regurgitation. - Pulmonary arteries: Systolic pressure was moderately increased. PA peak pressure: 47mm Hg (S).  ASSESSMENT AND PLAN: 1.  Chronic systolic heart failure, NYHA 3 symptoms at least in part due to COPD. Her functional limitation from exertional dyspnea is unchanged over time and she has no clinical exam evidence of volume overload. Will continue same Rx with carvedilol, bidil, lisinopril, and aldactone. Repeat labs in January 2019.  2. CAD, native vessel, without angina: s/p CABG 2009. Continue ASA and a statin drug.  3. HTN: BP controlled on current Rx.   4. Hyperlipidemia: recently changed to rosuvastatin. Follow-up labs in January as planned  5,. Left arm swelling: will check venous duplex to rule out DVT  Current medicines are reviewed with the patient today.  The patient does not have concerns regarding medicines.  Labs/ tests ordered today include:   Orders Placed This Encounter  Procedures  . TSH  . Basic  Metabolic Panel (BMET)  . EKG 12-Lead    Disposition:   FU Tereso Newcomer, PA-C in 6 months  Signed, Tonny Bollman, MD  07/28/2017 11:18 PM    [facility] [address] [place], [place], [place]  [address] Phone: [phone]; Fax: [phone]

## 2017-07-26 NOTE — Patient Instructions (Signed)
Medication Instructions:  Your provider recommends that you continue on your current medications as directed. Please refer to the Current Medication list given to you today.    Labwork: Keep your lab appointment in January.  Testing/Procedures: Dr. Excell Seltzer recommends you have a LEFT UPPER EXTREMITY DOPPLER.  Follow-Up: Your provider wants you to follow-up in: 6 months with Dr. Earmon Phoenix assistant. You will receive a reminder letter in the mail two months in advance. If you don't receive a letter, please call our office to schedule the follow-up appointment.  Your provider wants you to follow-up in: 1 year with Dr. Excell Seltzer. You will receive a reminder letter in the mail two months in advance. If you don't receive a letter, please call our office to schedule the follow-up appointment.      Any Other Special Instructions Will Be Listed Below (If Applicable).     If you need a refill on your cardiac medications before your next appointment, please call your pharmacy.

## 2017-07-27 ENCOUNTER — Other Ambulatory Visit: Payer: Self-pay | Admitting: Cardiovascular Disease

## 2017-07-27 DIAGNOSIS — R52 Pain, unspecified: Secondary | ICD-10-CM

## 2017-07-27 DIAGNOSIS — M7989 Other specified soft tissue disorders: Secondary | ICD-10-CM

## 2017-07-28 ENCOUNTER — Ambulatory Visit (HOSPITAL_COMMUNITY)
Admission: RE | Admit: 2017-07-28 | Discharge: 2017-07-28 | Disposition: A | Discharge status: Home / Self Care | Payer: Medicare Other | Source: Ambulatory Visit | Attending: Internal Medicine | Admitting: Internal Medicine

## 2017-07-28 DIAGNOSIS — M79602 Pain in left arm: Secondary | ICD-10-CM | POA: Insufficient documentation

## 2017-07-28 DIAGNOSIS — R52 Pain, unspecified: Secondary | ICD-10-CM

## 2017-07-28 DIAGNOSIS — M7989 Other specified soft tissue disorders: Secondary | ICD-10-CM | POA: Diagnosis present

## 2017-09-15 ENCOUNTER — Other Ambulatory Visit: Payer: Self-pay | Admitting: Cardiovascular Disease

## 2017-10-11 ENCOUNTER — Other Ambulatory Visit: Payer: Self-pay | Admitting: Cardiovascular Disease

## 2017-10-11 DIAGNOSIS — R0602 Shortness of breath: Secondary | ICD-10-CM

## 2017-10-11 DIAGNOSIS — I5022 Chronic systolic (congestive) heart failure: Secondary | ICD-10-CM

## 2017-10-20 ENCOUNTER — Other Ambulatory Visit: Payer: Medicare Other | Admitting: *Deleted

## 2017-10-20 DIAGNOSIS — I5022 Chronic systolic (congestive) heart failure: Secondary | ICD-10-CM

## 2017-10-20 DIAGNOSIS — E7849 Other hyperlipidemia: Secondary | ICD-10-CM

## 2017-10-21 LAB — LIPID PANEL
CHOLESTEROL TOTAL: 119 mg/dL (ref 100–199)
Chol/HDL Ratio: 3.2 ratio (ref 0.0–4.4)
HDL: 37 mg/dL — AB (ref >39)
LDL CALC: 54 mg/dL (ref 0–99)
TRIGLYCERIDES: 140 mg/dL (ref 0–149)
VLDL CHOLESTEROL CAL: 28 mg/dL (ref 5–40)

## 2017-10-21 LAB — BASIC METABOLIC PANEL
BUN / CREAT RATIO: 19 (ref 12–28)
BUN: 26 mg/dL (ref 8–27)
CO2: 23 mmol/L (ref 20–29)
Calcium: 10.1 mg/dL (ref 8.7–10.3)
Chloride: 96 mmol/L (ref 96–106)
Creatinine, Ser: 1.38 mg/dL — ABNORMAL HIGH (ref 0.57–1.00)
GFR calc Af Amer: 42 mL/min/{1.73_m2} — ABNORMAL LOW (ref >59)
GFR calc non Af Amer: 37 mL/min/{1.73_m2} — ABNORMAL LOW (ref >59)
GLUCOSE: 112 mg/dL — AB (ref 65–99)
POTASSIUM: 4.4 mmol/L (ref 3.5–5.2)
SODIUM: 140 mmol/L (ref 134–144)

## 2017-10-21 LAB — HEPATIC FUNCTION PANEL
ALK PHOS: 121 IU/L — AB (ref 39–117)
ALT: 12 IU/L (ref 0–32)
AST: 17 IU/L (ref 0–40)
Albumin: 4.6 g/dL (ref 3.5–4.8)
Bilirubin Total: 0.6 mg/dL (ref 0.0–1.2)
Bilirubin, Direct: 0.17 mg/dL (ref 0.00–0.40)
Total Protein: 8.4 g/dL (ref 6.0–8.5)

## 2017-10-21 LAB — TSH: TSH: 3.72 u[IU]/mL (ref 0.450–4.500)

## 2017-11-08 ENCOUNTER — Telehealth: Payer: Self-pay

## 2017-11-08 DIAGNOSIS — I5022 Chronic systolic (congestive) heart failure: Secondary | ICD-10-CM

## 2017-11-08 NOTE — Telephone Encounter (Signed)
-----   Message from Tonny BollmanMichael Cooper, MD sent at 11/04/2017 10:33 PM EST ----- Reviewed. Medical program reviewed for CHF. Repeat BMET 6 months. thx

## 2017-11-08 NOTE — Telephone Encounter (Signed)
Informed patient of results and verbal understanding expressed.  Repeat BMET ordered to be scheduled in 3 months. Recall placed per patient request. Patient agrees with treatment plan.

## 2018-01-15 ENCOUNTER — Other Ambulatory Visit: Payer: Self-pay | Admitting: Cardiovascular Disease

## 2018-01-19 ENCOUNTER — Ambulatory Visit (INDEPENDENT_AMBULATORY_CARE_PROVIDER_SITE_OTHER): Payer: Medicare Other | Admitting: Physician Assistant

## 2018-01-19 ENCOUNTER — Encounter: Payer: Self-pay | Admitting: Physician Assistant

## 2018-01-19 VITALS — BP 130/78 | HR 70 | Ht 64.0 in | Wt 194.8 lb

## 2018-01-19 DIAGNOSIS — I5022 Chronic systolic (congestive) heart failure: Secondary | ICD-10-CM

## 2018-01-19 DIAGNOSIS — E7849 Other hyperlipidemia: Secondary | ICD-10-CM

## 2018-01-19 DIAGNOSIS — I2510 Atherosclerotic heart disease of native coronary artery without angina pectoris: Secondary | ICD-10-CM | POA: Diagnosis not present

## 2018-01-19 DIAGNOSIS — I10 Essential (primary) hypertension: Secondary | ICD-10-CM

## 2018-01-19 MED ORDER — POTASSIUM CHLORIDE CRYS ER 20 MEQ PO TBCR: 10.0000 meq | EXTENDED_RELEASE_TABLET | Freq: Every day | ORAL | 3 refills | Status: DC

## 2018-01-19 MED ORDER — FUROSEMIDE 40 MG PO TABS: 20.0000 mg | ORAL_TABLET | Freq: Every day | ORAL | 3 refills | Status: DC

## 2018-01-19 MED ORDER — SACUBITRIL-VALSARTAN 24-26 MG PO TABS: 1.0000 | ORAL_TABLET | Freq: Two times a day (BID) | ORAL | 3 refills | Status: DC

## 2018-01-19 NOTE — Patient Instructions (Addendum)
Medication Instructions:  1. STOP Lisinopril  2. START Entresto 24/26 mg Twice daily   **Start the [employer]resto 36 hours after your last dose of Lisinopril**  **So, for example, if you take Lisinopril at 7 am every day - your 1st dose of Entresto would be 7 pm the next day**  3. DECREASE Lasix to 20 mg (take 1/2 of the 40 mg tab)  4. DECREASE Potassium to 10 mEq (take 1/2 of the 20 mEq tab)  Labwork: Today - BMET   2 weeks - BMET  Testing/Procedures: None   Follow-Up: Tereso NewcomerScott Tashara Suder, PA-C in 3 months   Any Other Special Instructions Will Be Listed Below (If Applicable).  If you need a refill on your cardiac medications before your next appointment, please call your pharmacy.

## 2018-01-19 NOTE — Progress Notes (Signed)
Cardiology Office Note:    Date:  01/19/2018   ID:  Baleria, Wyman Sep 07, 1941, MRN [id number]  PCP:  Fleet Contras, MD  Cardiologist:  Tonny Bollman, MD   Referring MD: Fleet Contras, MD   Chief Complaint  Patient presents with  . Follow-up    CAD, CHF    History of Present Illness:    [name] is a 77 y.o. female with CAD, s/p CABG in 2009, ICM, systolic CHF, HTN, HLP, COPD, chronic dyspnea, thrombocytopenia. 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 She initially presented with an NSTEMI. Subsequent bypass included a L-LAD, S-OM1 and OM2, S-Dx, S-PDA. She was admitted in 08/2011 with community acquired pneumonia versus COPD exacerbation. EF remained <35% and she was set up to see Dr. Graciela Husbands for consideration of ICD implantation. His recommendation was to further titrate medical Rx first.  Her EF improved to 35-40% and no ICD was felt to be necessary.   She was last seen by Dr. Excell Seltzer October 2018.  She had an ultrasound of her left arm due to swelling which is negative for DVT.    [name] returns for follow up.  She is here alone.  Her chronic dyspnea is unchanged.  She denies chest pain. She denies paroxysmal nocturnal dyspnea, edema, syncope.  She denies bleeding issues.   Prior CV studies:   The following studies were reviewed today:  Echo (06/23/13):  Mild focal basal septal hypertrophy, EF 35-40%, anteroseptal and apical HK, grade 2 diastolic dysfunction, mild MR, reduced RV systolic function, moderate TR, PASP 47.   Echocardiogram (5/14):  Moderate LVH, EF 30-35%, anteroseptal and apical HK.   Myoview (11/04/11):  Small inf scar, mod ant apical and septal scar, no ischemia, EF 31%. Med Rx continued.    Past Medical History:  Diagnosis Date  . CAD (coronary artery disease)    s/p NSTEMI 2009 - s/p CABG 2009: L-LAD, S-OM1 and OM2, S-Dx, S-PDA.;  Myoview 11/04/11:  Small inf scar, mod ant apical and septal scar, no  ischemia, EF 31%.  . Cardiomyopathy, ischemic    a.  LVEF 35-40% // b. Echo 5/14: Moderate LVH, EF 30-35%, mid to distal ant-septal, apical HK // c. Echo  9/14: Mild focal basal septal hypertrophy, EF 35-40%, anteroseptal and apical HK, grade 2 diastolic dysfunction, mild MR, reduced RV systolic function, moderate TR, PASP 47.      [name] Chronic systolic heart failure (HCC)   . HIT (heparin-induced thrombocytopenia) (HCC)   . HLD (hyperlipidemia)   . HTN (hypertension)   . Other emphysema (HCC)    Surgical Hx: The patient  has a past surgical history that includes Coronary artery bypass graft (November 21, 2007).   Current Medications: Current Meds  Medication Sig  . acetaminophen-codeine (TYLENOL #3) 300-30 MG per tablet Take 1 tablet by mouth 2 (two) times daily as needed for severe pain.   [name] albuterol (PROVENTIL HFA;VENTOLIN HFA) 108 (90 BASE) MCG/ACT inhaler 1-2 puffs every 6 (six) hours as needed (wheezing). 1-2 puffs every 4-6 hours as needed  . albuterol (PROVENTIL) (5 MG/ML) 0.5% nebulizer solution Take 2.5 mg by nebulization every 2 (two) hours as needed for wheezing or shortness of breath.  . allopurinol (ZYLOPRIM) 100 MG tablet Take 100 mg by mouth daily.   [name] aspirin 81 MG tablet Take 81 mg by mouth daily.   [name] BIDIL 20-37.5 MG tablet TAKE 1 TABLET BY MOUTH THREE TIMES DAILY  . budesonide-formoterol (SYMBICORT)  160-4.5 MCG/ACT inhaler Inhale 2 puffs into the lungs 2 (two) times daily.  . carvedilol (COREG) 6.25 MG tablet Take 1 tablet (6.25 mg total) by mouth 2 (two) times daily with a meal.  . colchicine 0.6 MG tablet Take 0.6 mg by mouth 2 (two) times daily as needed. For gout  . famotidine (PEPCID) 20 MG tablet Take 1 tablet (20 mg total) by mouth 2 (two) times daily. Take one tablet twice daily for two days  . ipratropium (ATROVENT) 0.02 % nebulizer solution Take 0.5 mg by nebulization 4 (four) times daily.  . Multiple Vitamin (MULTIVITAMIN) tablet Take 1 tablet by mouth daily.     . nitroGLYCERIN (NITROSTAT) 0.4 MG SL tablet PLACE 1 TABLET UNDER TONGUE EVERY 5 MINUTES AS NEEDED FOR CHEST PAIN  . omeprazole (PRILOSEC) 10 MG capsule Take 1 capsule (10 mg total) by mouth daily.  . rosuvastatin (CRESTOR) 40 MG tablet Take 1 tablet (40 mg total) by mouth daily.  [name] spironolactone (ALDACTONE) 25 MG tablet Take 1 tablet (25 mg total) by mouth daily.  [name] tiotropium (SPIRIVA) 18 MCG inhalation capsule Place 18 mcg into inhaler and inhale daily.  . [DISCONTINUED] furosemide (LASIX) 40 MG tablet Take 1 tablet (40 mg total) by mouth daily.  . [DISCONTINUED] lisinopril (PRINIVIL,ZESTRIL) 10 MG tablet TAKE 1 TABLET BY MOUTH EVERY DAY  . [DISCONTINUED] potassium chloride SA (K-DUR,KLOR-CON) 20 MEQ tablet TAKE 1 TABLET BY MOUTH ONCE DAILY     Allergies:   Patient has no known allergies.   Social History   Tobacco Use  . Smoking status: Former Smoker    Packs/day: 1.00    Years: 44.00    Pack years: 44.00    Types: Cigarettes    Last attempt to quit: 10/20/2007    Years since quitting: 10.2  . Smokeless tobacco: Never Used  . Tobacco comment: started at age 72s.   Substance Use Topics  . Alcohol use: No  . Drug use: No     Family Hx: The patient's family history includes Asthma in her mother; Diabetes in her mother; Hypertension in her mother; Rheum arthritis in her mother.  ROS:   Please see the history of present illness.    Review of Systems  Cardiovascular: Positive for dyspnea on exertion.  Musculoskeletal: Positive for back pain and joint pain.   All other systems reviewed and are negative.   EKGs/Labs/Other Test Reviewed:    EKG:  EKG is  ordered today.  The ekg ordered today demonstrates normal sinus rhythm, heart rate 70, left axis deviation, PACs, QTC 468  Recent Labs: 10/20/2017: ALT 12; BUN 26; Creatinine, Ser 1.38; Potassium 4.4; Sodium 140; TSH 3.720   Recent Lipid Panel Lab Results  Component Value Date/Time   CHOL 119 10/20/2017 12:04 PM   TRIG  140 10/20/2017 12:04 PM   HDL 37 (L) 10/20/2017 12:04 PM   CHOLHDL 3.2 10/20/2017 12:04 PM   CHOLHDL 3 01/29/2014 11:37 AM   LDLCALC 54 10/20/2017 12:04 PM    Physical Exam:    VS:  BP 130/78   Pulse 70   Ht 5\' 4"  (1.626 m)   Wt 194 lb 12.8 oz (88.4 kg)   SpO2 96%   BMI 33.44 kg/m     Wt Readings from Last 3 Encounters:  01/19/18 194 lb 12.8 oz (88.4 kg)  07/26/17 192 lb 12.8 oz (87.5 kg)  01/11/17 198 lb (89.8 kg)     Physical Exam  Constitutional: She is oriented to person, place,  and time. She appears well-developed and well-nourished. No distress.  HENT:  Head: Normocephalic and atraumatic.  Neck: No JVD present.  Cardiovascular: Normal rate and regular rhythm.  No murmur heard. Pulmonary/Chest: She has decreased breath sounds. She has no rales.  Abdominal: Soft.  Musculoskeletal: She exhibits no edema.  Neurological: She is alert and oriented to person, place, and time.  Skin: Skin is warm and dry.    ASSESSMENT & PLAN:    #1.  Chronic systolic heart failure (HCC) EF 35-40.  She has chronic dyspnea related to COPD.  However, I suspect her dyspnea is multifactorial.  She is at least NYHA 2 b.  She is on a good medical regimen which includes beta-blocker, hydralazine and nitrates, ACE inhibitor and spironolactone.  I think she would benefit [facility].  We discussed the benefits of this drug and she would like to try it.  -DC lisinopril  -Start Entresto 24/26 mg twice daily (36 hours after last dose of lisinopril)  -BMET today  -BMET 2 weeks  -Follow-up with me in 3 months  #2.  Coronary artery disease involving native coronary artery of native heart without angina pectoris History of CABG in 2009 and low risk Myoview in 2013.  She denies anginal symptoms.  Continue aspirin, statin.  #3.  Essential hypertension The patient's blood pressure is controlled on her current regimen.  Continue current therapy.   #4.  Other hyperlipidemia LDL optimal on most recent  lab work.  Continue current Rx.     Dispo:  Return in about 3 months (around 04/20/2018) for Routine Follow Up, w/ Tereso NewcomerScott Weaver, PA-C.   Medication Adjustments/Labs and Tests Ordered: Current medicines are reviewed at length with the patient today.  Concerns regarding medicines are outlined above.  Tests Ordered: Orders Placed This Encounter  Procedures  . Basic Metabolic Panel (BMET)  . Basic Metabolic Panel (BMET)  . EKG 12-Lead   Medication Changes: Meds ordered this encounter  Medications  . sacubitril-valsartan (ENTRESTO) 24-26 MG    Sig: Take 1 tablet by mouth 2 (two) times daily.    Dispense:  180 tablet    Refill:  3    D/C LISINOPRIL; START ENTRESTO  . furosemide (LASIX) 40 MG tablet    Sig: Take 0.5 tablets (20 mg total) by mouth daily.    Dispense:  45 tablet    Refill:  3    DECREASE IN DOSE  . potassium chloride SA (K-DUR,KLOR-CON) 20 MEQ tablet    Sig: Take 0.5 tablets (10 mEq total) by mouth daily.    Dispense:  45 tablet    Refill:  3    DECREASE IN DOSE    Signed, Tereso NewcomerScott Weaver, PA-C  01/19/2018 11:52 AM    [facility] [address], [place], [place]  [id number] Phone: [phone]; Fax: [phone]

## 2018-01-20 ENCOUNTER — Other Ambulatory Visit: Payer: Self-pay | Admitting: Cardiovascular Disease

## 2018-01-20 DIAGNOSIS — R0602 Shortness of breath: Secondary | ICD-10-CM

## 2018-01-20 DIAGNOSIS — I5022 Chronic systolic (congestive) heart failure: Secondary | ICD-10-CM

## 2018-01-20 LAB — BASIC METABOLIC PANEL
BUN/Creatinine Ratio: 15 (ref 12–28)
BUN: 16 mg/dL (ref 8–27)
CALCIUM: 9.7 mg/dL (ref 8.7–10.3)
CHLORIDE: 103 mmol/L (ref 96–106)
CO2: 23 mmol/L (ref 20–29)
Creatinine, Ser: 1.06 mg/dL — ABNORMAL HIGH (ref 0.57–1.00)
GFR calc non Af Amer: 50 mL/min/{1.73_m2} — ABNORMAL LOW (ref >59)
GFR, EST AFRICAN AMERICAN: 58 mL/min/{1.73_m2} — AB (ref >59)
GLUCOSE: 102 mg/dL — AB (ref 65–99)
POTASSIUM: 4.6 mmol/L (ref 3.5–5.2)
Sodium: 146 mmol/L — ABNORMAL HIGH (ref 134–144)

## 2018-01-21 ENCOUNTER — Telehealth: Payer: Self-pay | Admitting: *Deleted

## 2018-01-21 NOTE — Telephone Encounter (Signed)
-----   Message from Beatrice LecherScott T Weaver, PA-C sent at 01/20/2018  7:58 AM EDT ----- Renal function stable.  The potassium is normal. Continue current medications and follow up as planned.  Tereso NewcomerScott Weaver, PA-C    01/20/2018 7:58 AM

## 2018-01-21 NOTE — Telephone Encounter (Signed)
Pt has been notified of lab results by phone with verbal understanding. Pt agreeable to plan of care and thanked me for my call today.

## 2018-02-02 ENCOUNTER — Other Ambulatory Visit: Payer: Medicare Other | Admitting: *Deleted

## 2018-02-02 DIAGNOSIS — I5022 Chronic systolic (congestive) heart failure: Secondary | ICD-10-CM

## 2018-02-03 LAB — BASIC METABOLIC PANEL
BUN / CREAT RATIO: 14 (ref 12–28)
BUN: 14 mg/dL (ref 8–27)
CO2: 27 mmol/L (ref 20–29)
CREATININE: 1 mg/dL (ref 0.57–1.00)
Calcium: 9.2 mg/dL (ref 8.7–10.3)
Chloride: 104 mmol/L (ref 96–106)
GFR calc Af Amer: 62 mL/min/{1.73_m2} (ref >59)
GFR calc non Af Amer: 54 mL/min/{1.73_m2} — ABNORMAL LOW (ref >59)
GLUCOSE: 101 mg/dL — AB (ref 65–99)
POTASSIUM: 4.4 mmol/L (ref 3.5–5.2)
SODIUM: 145 mmol/L — AB (ref 134–144)

## 2018-04-19 ENCOUNTER — Encounter: Payer: Self-pay | Admitting: Physician Assistant

## 2018-04-19 ENCOUNTER — Ambulatory Visit (INDEPENDENT_AMBULATORY_CARE_PROVIDER_SITE_OTHER): Payer: Medicare Other | Admitting: Physician Assistant

## 2018-04-19 VITALS — BP 98/60 | HR 77 | Ht 64.0 in | Wt 199.8 lb

## 2018-04-19 DIAGNOSIS — I5022 Chronic systolic (congestive) heart failure: Secondary | ICD-10-CM

## 2018-04-19 DIAGNOSIS — I2510 Atherosclerotic heart disease of native coronary artery without angina pectoris: Secondary | ICD-10-CM | POA: Diagnosis not present

## 2018-04-19 DIAGNOSIS — I10 Essential (primary) hypertension: Secondary | ICD-10-CM | POA: Diagnosis not present

## 2018-04-19 DIAGNOSIS — E7849 Other hyperlipidemia: Secondary | ICD-10-CM

## 2018-04-19 NOTE — Patient Instructions (Signed)
Medication Instructions:  No changes.  Continue your current medications.   Labwork: None   Testing/Procedures: None   Follow-Up: Tonny BollmanMichael Cooper, MD or Tereso NewcomerScott Raylee Adamec, PA-C in 6 months.   Any Other Special Instructions Will Be Listed Below (If Applicable).  If you need a refill on your cardiac medications before your next appointment, please call your pharmacy.

## 2018-04-19 NOTE — Progress Notes (Signed)
Cardiology Office Note:    Date:  04/19/2018   ID:  [name], DOB [birth year], MRN [id number]  PCP:  Fleet ContrasAvbuere, Edwin, MD  Cardiologist:  Tonny BollmanMichael Cooper, MD   Referring MD: Fleet ContrasAvbuere, Edwin, MD   Chief Complaint  Patient presents with  . Follow-up    CHF, CAD    History of Present Illness:    [name] is a 77 y.o. female with coronary artery disease s/p CABG in 2009, ischemic cardiomyopathy, systolic congestive heart failure, hypertension, hyperlipidemia, COPD, chronic dyspnea, thrombocytopenia. 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 She initially presented with an NSTEMI. Subsequent bypass included a L-LAD, S-OM1 and OM2, S-Dx, S-PDA. She was admitted in 08/2011 with community acquired pneumonia versus COPD exacerbation. EF remained <35% and she was set up to see Dr. Graciela HusbandsKlein for consideration of ICD implantation.His recommendationwas to further titrate medical Rx first. Her EF improved to 35-40% and no ICD was felt to be necessary.  I last saw her in April 2019.  I changed her ACE inhibitor to angiotensin receptor neprilysin inhibitor (Entresto) at that visit.    [name] returns for follow up on congestive heart failure and coronary artery disease.  She is here alone.  Her breathing is stable.  She has chronic dyspnea on exertion with moderate activities.  She has occasional chest pain that she attributes to gas.  She denies exertional chest pain or chest pain similar to her previous angina.  She denies orthopnea, paroxysmal nocturnal dyspnea, leg swelling.  She denies dizziness, near syncope or syncope.    Prior CV studies:   The following studies were reviewed today:  Echo (06/23/13):  Mild focal basal septal hypertrophy, EF 35-40%, anteroseptal and apical HK, grade 2 diastolic dysfunction, mild MR, reduced RV systolic function, moderate TR, PASP 47.   Echocardiogram (5/14):  Moderate LVH, EF 30-35%, anteroseptal and apical HK.     Myoview (11/04/11):  Small inf scar, mod ant apical and septal scar, no ischemia, EF 31%. Med Rx continued.  Past Medical History:  Diagnosis Date  . CAD (coronary artery disease)    s/p NSTEMI 2009 - s/p CABG 2009: L-LAD, S-OM1 and OM2, S-Dx, S-PDA.;  Myoview 11/04/11:  Small inf scar, mod ant apical and septal scar, no ischemia, EF 31%.  . Cardiomyopathy, ischemic    a.  LVEF 35-40% // b. Echo 5/14: Moderate LVH, EF 30-35%, mid to distal ant-septal, apical HK // c. Echo  9/14: Mild focal basal septal hypertrophy, EF 35-40%, anteroseptal and apical HK, grade 2 diastolic dysfunction, mild MR, reduced RV systolic function, moderate TR, PASP 47.      [name]. Chronic systolic heart failure (HCC)   . HIT (heparin-induced thrombocytopenia) (HCC)   . HLD (hyperlipidemia)   . HTN (hypertension)   . Other emphysema (HCC)    Surgical Hx: The patient  has a past surgical history that includes Coronary artery bypass graft (November 21, 2007).   Current Medications: Current Meds  Medication Sig  . acetaminophen-codeine (TYLENOL #3) 300-30 MG per tablet Take 1 tablet by mouth 2 (two) times daily as needed for severe pain.   [name]. albuterol (PROVENTIL HFA;VENTOLIN HFA) 108 (90 BASE) MCG/ACT inhaler 1-2 puffs every 6 (six) hours as needed (wheezing). 1-2 puffs every 4-6 hours as needed  . albuterol (PROVENTIL) (5 MG/ML) 0.5% nebulizer solution Take 2.5 mg by nebulization every 2 (two) hours as needed for wheezing or shortness of breath.  . allopurinol (ZYLOPRIM) 100 MG  tablet Take 100 mg by mouth daily.   [name] aspirin 81 MG tablet Take 81 mg by mouth daily.   [name] BIDIL 20-37.5 MG tablet TAKE 1 TABLET BY MOUTH THREE TIMES DAILY  . budesonide-formoterol (SYMBICORT) 160-4.5 MCG/ACT inhaler Inhale 2 puffs into the lungs 2 (two) times daily.  . carvedilol (COREG) 6.25 MG tablet Take 1 tablet (6.25 mg total) by mouth 2 (two) times daily with a meal.  . colchicine 0.6 MG tablet Take 0.6 mg by mouth 2 (two) times daily  as needed. For gout  . famotidine (PEPCID) 20 MG tablet Take 1 tablet (20 mg total) by mouth 2 (two) times daily. Take one tablet twice daily for two days  . furosemide (LASIX) 40 MG tablet Take 0.5 tablets (20 mg total) by mouth daily.  [name] ipratropium (ATROVENT) 0.02 % nebulizer solution Take 0.5 mg by nebulization 4 (four) times daily.  . Multiple Vitamin (MULTIVITAMIN) tablet Take 1 tablet by mouth daily.   . nitroGLYCERIN (NITROSTAT) 0.4 MG SL tablet PLACE 1 TABLET UNDER TONGUE EVERY 5 MINUTES AS NEEDED FOR CHEST PAIN  . omeprazole (PRILOSEC) 10 MG capsule Take 1 capsule (10 mg total) by mouth daily.  . potassium chloride SA (K-DUR,KLOR-CON) 20 MEQ tablet Take 0.5 tablets (10 mEq total) by mouth daily.  . rosuvastatin (CRESTOR) 40 MG tablet Take 1 tablet (40 mg total) by mouth daily.  . sacubitril-valsartan (ENTRESTO) 24-26 MG Take 1 tablet by mouth 2 (two) times daily.  [name] spironolactone (ALDACTONE) 25 MG tablet TAKE 1 TABLET BY MOUTH DAILY  . tiotropium (SPIRIVA) 18 MCG inhalation capsule Place 18 mcg into inhaler and inhale daily.     Allergies:   Patient has no known allergies.   Social History   Tobacco Use  . Smoking status: Former Smoker    Packs/day: 1.00    Years: 44.00    Pack years: 44.00    Types: Cigarettes    Last attempt to quit: 10/20/2007    Years since quitting: 10.5  . Smokeless tobacco: Never Used  . Tobacco comment: started at age 38s.   Substance Use Topics  . Alcohol use: No  . Drug use: No     Family Hx: The patient's family history includes Asthma in her mother; Diabetes in her mother; Hypertension in her mother; Rheum arthritis in her mother.  ROS:   Please see the history of present illness.    Review of Systems  Constitution: Positive for malaise/fatigue.  Cardiovascular: Positive for chest pain.  Respiratory: Positive for shortness of breath.   Musculoskeletal: Positive for back pain, joint pain and myalgias.  Neurological: Positive for loss of  balance.   All other systems reviewed and are negative.   EKGs/Labs/Other Test Reviewed:    EKG:  EKG is not ordered today.    Recent Labs: 10/20/2017: ALT 12; TSH 3.720 02/02/2018: BUN 14; Creatinine, Ser 1.00; Potassium 4.4; Sodium 145   Recent Lipid Panel Lab Results  Component Value Date/Time   CHOL 119 10/20/2017 12:04 PM   TRIG 140 10/20/2017 12:04 PM   HDL 37 (L) 10/20/2017 12:04 PM   CHOLHDL 3.2 10/20/2017 12:04 PM   CHOLHDL 3 01/29/2014 11:37 AM   LDLCALC 54 10/20/2017 12:04 PM    Physical Exam:    VS:  BP 98/60   Pulse 77   Ht 5\' 4"  (1.626 m)   Wt 199 lb 12.8 oz (90.6 kg)   SpO2 93%   BMI 34.30 kg/m     Wt Readings  from Last 3 Encounters:  04/19/18 199 lb 12.8 oz (90.6 kg)  01/19/18 194 lb 12.8 oz (88.4 kg)  07/26/17 192 lb 12.8 oz (87.5 kg)     Physical Exam  Constitutional: She is oriented to person, place, and time. She appears well-developed and well-nourished. No distress.  HENT:  Head: Normocephalic and atraumatic.  Neck: Neck supple.  Cardiovascular: Normal rate and regular rhythm. Exam reveals distant heart sounds.  No murmur heard. Pulmonary/Chest: Effort normal. She has no wheezes. She has no rales.  Abdominal: Soft.  Musculoskeletal: She exhibits no edema.  Neurological: She is alert and oriented to person, place, and time.  Skin: Skin is warm and dry.    ASSESSMENT & PLAN:    Chronic systolic heart failure (HCC) EF 35-40.  NYHA 3a.  Her dyspnea is multifactorial and related to COPD, CHF, CAD, deconditioning.  Her blood pressure cannot tolerate further adjustments in her heart failure therapy.  Continue current dose of carvedilol, furosemide, isosorbide/hydralazine, Entresto, spironolactone.  Coronary artery disease involving native coronary artery of native heart without angina pectoris History of prior myocardial infarction followed by CABG in 2009.  Nuclear stress test in 2013 was low risk.  She has occasional chest pain that she  attributes to gas.  She denies any recurrent anginal symptoms.  Continue current therapy with aspirin 81 mg, carvedilol, rosuvastatin.  Essential hypertension The patient's blood pressure is controlled on her current regimen.  Continue current therapy.   Other hyperlipidemia LDL optimal on most recent lab work.  Continue current Rx.     Dispo:  Return in about 6 months (around 10/20/2018) for Routine Follow Up, w/ Dr. Excell Seltzer, or Tereso Newcomer, PA-C.   Medication Adjustments/Labs and Tests Ordered: Current medicines are reviewed at length with the patient today.  Concerns regarding medicines are outlined above.  Tests Ordered: No orders of the defined types were placed in this encounter.  Medication Changes: No orders of the defined types were placed in this encounter.   Signed, Tereso Newcomer, PA-C  04/19/2018 12:46 PM    [facility] [address] [place], [place], [place]  [address] Phone: [phone]; Fax: [phone]

## 2018-07-10 ENCOUNTER — Other Ambulatory Visit: Payer: Self-pay | Admitting: Cardiovascular Disease

## 2018-09-08 ENCOUNTER — Other Ambulatory Visit: Payer: Self-pay | Admitting: Physician Assistant

## 2018-10-04 ENCOUNTER — Other Ambulatory Visit: Payer: Self-pay | Admitting: Cardiovascular Disease

## 2018-10-04 DIAGNOSIS — I5022 Chronic systolic (congestive) heart failure: Secondary | ICD-10-CM

## 2018-10-04 DIAGNOSIS — R0602 Shortness of breath: Secondary | ICD-10-CM

## 2018-10-18 ENCOUNTER — Encounter: Payer: Self-pay | Admitting: Physician Assistant

## 2018-10-18 ENCOUNTER — Telehealth: Payer: Self-pay

## 2018-10-18 ENCOUNTER — Ambulatory Visit (INDEPENDENT_AMBULATORY_CARE_PROVIDER_SITE_OTHER): Payer: Medicare Other | Admitting: Physician Assistant

## 2018-10-18 VITALS — BP 120/62 | HR 75 | Ht 64.0 in | Wt 208.0 lb

## 2018-10-18 DIAGNOSIS — I2510 Atherosclerotic heart disease of native coronary artery without angina pectoris: Secondary | ICD-10-CM

## 2018-10-18 DIAGNOSIS — R079 Chest pain, unspecified: Secondary | ICD-10-CM | POA: Diagnosis not present

## 2018-10-18 DIAGNOSIS — I493 Ventricular premature depolarization: Secondary | ICD-10-CM

## 2018-10-18 DIAGNOSIS — Z79899 Other long term (current) drug therapy: Secondary | ICD-10-CM

## 2018-10-18 DIAGNOSIS — I5022 Chronic systolic (congestive) heart failure: Secondary | ICD-10-CM

## 2018-10-18 DIAGNOSIS — I10 Essential (primary) hypertension: Secondary | ICD-10-CM | POA: Diagnosis not present

## 2018-10-18 DIAGNOSIS — E7849 Other hyperlipidemia: Secondary | ICD-10-CM

## 2018-10-18 LAB — COMPREHENSIVE METABOLIC PANEL
ALT: 13 IU/L (ref 0–32)
AST: 16 IU/L (ref 0–40)
Albumin/Globulin Ratio: 1.5 (ref 1.2–2.2)
Albumin: 4.3 g/dL (ref 3.5–4.8)
Alkaline Phosphatase: 87 IU/L (ref 39–117)
BUN / CREAT RATIO: 13 (ref 12–28)
BUN: 16 mg/dL (ref 8–27)
Bilirubin Total: 0.5 mg/dL (ref 0.0–1.2)
CALCIUM: 10 mg/dL (ref 8.7–10.3)
CHLORIDE: 100 mmol/L (ref 96–106)
CO2: 24 mmol/L (ref 20–29)
CREATININE: 1.27 mg/dL — AB (ref 0.57–1.00)
GFR calc non Af Amer: 40 mL/min/{1.73_m2} — ABNORMAL LOW (ref >59)
GFR, EST AFRICAN AMERICAN: 46 mL/min/{1.73_m2} — AB (ref >59)
GLUCOSE: 111 mg/dL — AB (ref 65–99)
Globulin, Total: 2.9 g/dL (ref 1.5–4.5)
Potassium: 3.3 mmol/L — ABNORMAL LOW (ref 3.5–5.2)
Sodium: 143 mmol/L (ref 134–144)
TOTAL PROTEIN: 7.2 g/dL (ref 6.0–8.5)

## 2018-10-18 LAB — MAGNESIUM: MAGNESIUM: 1.9 mg/dL (ref 1.6–2.3)

## 2018-10-18 MED ORDER — SPIRONOLACTONE 25 MG PO TABS: 25.0000 mg | ORAL_TABLET | Freq: Every day | ORAL | 3 refills | Status: DC

## 2018-10-18 MED ORDER — POTASSIUM CHLORIDE CRYS ER 20 MEQ PO TBCR: 20.0000 meq | EXTENDED_RELEASE_TABLET | Freq: Every day | ORAL | 3 refills | Status: DC

## 2018-10-18 MED ORDER — ROSUVASTATIN CALCIUM 40 MG PO TABS: 40.0000 mg | ORAL_TABLET | Freq: Every day | ORAL | 3 refills | Status: DC

## 2018-10-18 NOTE — Progress Notes (Signed)
Cardiology Office Note:    Date:  10/18/2018   ID:  [name], DOB 12/08/1940, MRN [id number]  PCP:  Avbuere, Edwin, MD  Cardiologist:  Michael Cooper, MD   Electrophysiologist:  None   Referring MD: Avbuere, Edwin, MD   Chief Complaint  Patient presents with  . Follow-up    CAD, CHF     History of Present Illness:    [name] is a 77 y.o. female with coronary artery disease s/p CABG in 2009, ischemic cardiomyopathy, systolic congestive heart failure, hypertension, hyperlipidemia, COPD, chronic dyspnea, thrombocytopenia. 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 She initially presented with an NSTEMI. Subsequent bypass included a L-LAD, S-OM1 and OM2, S-Dx, S-PDA. She was admitted in 08/2011 with community acquired pneumonia versus COPD exacerbation. EF remained <35% and she was set up to see Dr. Klein for consideration of ICD implantation.His recommendationwas to further titrate medical Rx first. Her EF improved to 35-40% and no ICD was felt to be necessary.  She was last seen in clinic in July 2019.   [name] returns for follow up on her coronary artery disease and congestive heart failure.  She is here alone.  She has chronic dyspnea related to COPD.  Overall, her shortness of breath seems stable.  She denies paroxysmal nocturnal dyspnea, orthopnea, leg swelling.  She denies syncope.  She has occasional chest pain that she attributes to acid reflux/gas.  She is able to take antacids with relief.  However, recently, she has noticed L arm pain that is fairly constant at times.  She has not had any injury and does not have worsening pain with positional changes.  She is fairly sedentary.  She may have more symptoms when she is active.  Her previous anginal equivalent was shortness of breath.    Prior CV studies:   The following studies were reviewed today:  Echo (06/23/13):  Mild focal basal septal hypertrophy, EF 35-40%, anteroseptal  and apical HK, grade 2 diastolic dysfunction, mild MR, reduced RV systolic function, moderate TR, PASP 47.   Echocardiogram (5/14):  Moderate LVH, EF 30-35%, anteroseptal and apical HK.   Myoview (11/04/11):  Small inf scar, mod ant apical and septal scar, no ischemia, EF 31%. Med Rx continued.  Past Medical History:  Diagnosis Date  . CAD (coronary artery disease)    s/p NSTEMI 2009 - s/p CABG 2009: L-LAD, S-OM1 and OM2, S-Dx, S-PDA.;  Myoview 11/04/11:  Small inf scar, mod ant apical and septal scar, no ischemia, EF 31%.  . Cardiomyopathy, ischemic    a.  LVEF 35-40% // b. Echo 5/14: Moderate LVH, EF 30-35%, mid to distal ant-septal, apical HK // c. Echo  9/14: Mild focal basal septal hypertrophy, EF 35-40%, anteroseptal and apical HK, grade 2 diastolic dysfunction, mild MR, reduced RV systolic function, moderate TR, PASP 47.      . Chronic systolic heart failure (HCC)   . HIT (heparin-induced thrombocytopenia) (HCC)   . HLD (hyperlipidemia)   . HTN (hypertension)   . Other emphysema (HCC)    Surgical Hx: The patient  has a past surgical history that includes Coronary artery bypass graft (November 21, 2007).   Current Medications: Current Meds  Medication Sig  . acetaminophen-codeine (TYLENOL #3) 300-30 MG per tablet Take 1 tablet by mouth 2 (two) times daily as needed for severe pain.   . albuterol (PROVENTIL HFA;VENTOLIN HFA) 108 (90 BASE) MCG/ACT inhaler 1-2 puffs every 6 (six) hours as needed (  wheezing). 1-2 puffs every 4-6 hours as needed  . albuterol (PROVENTIL) (5 MG/ML) 0.5% nebulizer solution Take 2.5 mg by nebulization every 2 (two) hours as needed for wheezing or shortness of breath.  . allopurinol (ZYLOPRIM) 100 MG tablet Take 100 mg by mouth daily.   . aspirin 81 MG tablet Take 81 mg by mouth daily.   . BIDIL 20-37.5 MG tablet TAKE 1 TABLET BY MOUTH THREE TIMES DAILY  . budesonide-formoterol (SYMBICORT) 160-4.5 MCG/ACT inhaler Inhale 2 puffs into the lungs 2 (two)  times daily.  . carvedilol (COREG) 6.25 MG tablet TAKE 1 TABLET(6.25 MG) BY MOUTH TWICE DAILY WITH A MEAL  . colchicine 0.6 MG tablet Take 0.6 mg by mouth 2 (two) times daily as needed. For gout  . famotidine (PEPCID) 20 MG tablet Take 1 tablet (20 mg total) by mouth 2 (two) times daily. Take one tablet twice daily for two days  . furosemide (LASIX) 40 MG tablet Take 0.5 tablets (20 mg total) by mouth daily.  . ipratropium (ATROVENT) 0.02 % nebulizer solution Take 0.5 mg by nebulization 4 (four) times daily.  . Multiple Vitamin (MULTIVITAMIN) tablet Take 1 tablet by mouth daily.   . nitroGLYCERIN (NITROSTAT) 0.4 MG SL tablet PLACE 1 TABLET UNDER TONGUE EVERY 5 MINUTES AS NEEDED FOR CHEST PAIN  . omeprazole (PRILOSEC) 10 MG capsule Take 1 capsule (10 mg total) by mouth daily.  . potassium chloride SA (K-DUR,KLOR-CON) 20 MEQ tablet Take 0.5 tablets (10 mEq total) by mouth daily.  . rosuvastatin (CRESTOR) 40 MG tablet Take 1 tablet (40 mg total) by mouth daily.  . sacubitril-valsartan (ENTRESTO) 24-26 MG Take 1 tablet by mouth 2 (two) times daily.  . spironolactone (ALDACTONE) 25 MG tablet Take 1 tablet (25 mg total) by mouth daily.  . tiotropium (SPIRIVA) 18 MCG inhalation capsule Place 18 mcg into inhaler and inhale daily.  . [DISCONTINUED] rosuvastatin (CRESTOR) 40 MG tablet Take 1 tablet (40 mg total) by mouth daily.  . [DISCONTINUED] spironolactone (ALDACTONE) 25 MG tablet TAKE 1 TABLET BY MOUTH DAILY     Allergies:   Patient has no known allergies.   Social History   Tobacco Use  . Smoking status: Former Smoker    Packs/day: 1.00    Years: 44.00    Pack years: 44.00    Types: Cigarettes    Last attempt to quit: 10/20/2007    Years since quitting: 11.0  . Smokeless tobacco: Never Used  . Tobacco comment: started at age 28s.   Substance Use Topics  . Alcohol use: No  . Drug use: No     Family Hx: The patient's family history includes Asthma in her mother; Diabetes in her mother;  Hypertension in her mother; Rheum arthritis in her mother.  ROS:   Please see the history of present illness.    Review of Systems  Cardiovascular: Positive for chest pain.  Respiratory: Positive for shortness of breath and wheezing.   Musculoskeletal: Positive for back pain, joint pain and myalgias.  Neurological: Positive for dizziness.   All other systems reviewed and are negative.   EKGs/Labs/Other Test Reviewed:    EKG:  EKG is  ordered today.  The ekg ordered today demonstrates NSR, HR 75, LAD, IVCD, multifocal PVCs, QTc 484  Recent Labs: 10/20/2017: ALT 12; TSH 3.720 02/02/2018: BUN 14; Creatinine, Ser 1.00; Potassium 4.4; Sodium 145   Recent Lipid Panel Lab Results  Component Value Date/Time   CHOL 119 10/20/2017 12:04 PM   TRIG 140 10/20/2017   12:04 PM   HDL 37 (L) 10/20/2017 12:04 PM   CHOLHDL 3.2 10/20/2017 12:04 PM   CHOLHDL 3 01/29/2014 11:37 AM   LDLCALC 54 10/20/2017 12:04 PM    Physical Exam:    VS:  BP 120/62   Pulse 75   Ht 5' 4" (1.626 m)   Wt 208 lb (94.3 kg)   SpO2 94%   BMI 35.70 kg/m     Wt Readings from Last 3 Encounters:  10/18/18 208 lb (94.3 kg)  04/19/18 199 lb 12.8 oz (90.6 kg)  01/19/18 194 lb 12.8 oz (88.4 kg)     Physical Exam  Constitutional: She is oriented to person, place, and time. She appears well-developed and well-nourished. No distress.  HENT:  Head: Normocephalic and atraumatic.  Eyes: No scleral icterus.  Neck: Neck supple. No JVD present. No thyromegaly present.  Cardiovascular: Normal rate, S1 normal, S2 normal and normal heart sounds. A regularly irregular rhythm present.  No murmur heard. Pulmonary/Chest: Effort normal. She has no rales.  Abdominal: Soft. There is no hepatomegaly.  Musculoskeletal:        General: No edema.  Lymphadenopathy:    She has no cervical adenopathy.  Neurological: She is alert and oriented to person, place, and time.  Skin: Skin is warm and dry.  Psychiatric: She has a normal mood and  affect.    ASSESSMENT & PLAN:    Chronic systolic heart failure (HCC)  EF 35-40.  NYHA 3a.  Volume appears stable.  She is on an good regimen for CHF.  Continue beta-blocker, angiotensin receptor neprilysin inhibitor, spironolactone, hydralazine, nitrates.  If EF much lower on Nuclear stress test (see below), will get a repeat echo.    Chest Pain/Left arm pain She has had some atypical chest pain for a while that sounds more GI than cardiac.  However, she has recently noticed L arm pain.  Her ECG shows a few PVCs, but no ischemic changes when compared to her prior ECG.  It has been 6+ years since her last assessment for ischemia.  I have recommended she proceed with stress testing to rule out ischemia.    -Arrange Lexiscan Myoview.  Coronary artery disease involving native coronary artery of native heart without angina pectoris Hx of MI in 2009 treated with CABG.  Nuclear stress test in 2013 was low risk.  She is having some chest pain and L arm pain.  Proceed with Nuclear stress test as noted.  Continue ASA, statin, beta-blocker, nitrates.  Essential hypertension The patient's blood pressure is controlled on her current regimen.  Continue current therapy.    Other hyperlipidemia Arrange follow up CMET, Lipids. Continue statin Rx.  PVCs She has some multifocal PVCs on ECG.  Proceed with stress testing as noted.  Obtain CMET, Mg2+ level today.  Dispo:  Return in about 6 months (around 04/18/2019) for Routine Follow Up, w/ Dr. Burt Knack, or Richardson Dopp, PA-C.   Medication Adjustments/Labs and Tests Ordered: Current medicines are reviewed at length with the patient today.  Concerns regarding medicines are outlined above.  Tests Ordered: Orders Placed This Encounter  Procedures  . Lipid panel  . Comp Met (CMET)  . Magnesium  . MYOCARDIAL PERFUSION IMAGING   Medication Changes: Meds ordered this encounter  Medications  . rosuvastatin (CRESTOR) 40 MG tablet    Sig: Take 1 tablet (40 mg  total) by mouth daily.    Dispense:  90 tablet    Refill:  3  . spironolactone (ALDACTONE) 25  MG tablet    Sig: Take 1 tablet (25 mg total) by mouth daily.    Dispense:  90 tablet    Refill:  3    Signed, Scott Weaver, PA-C  10/18/2018 1:39 PM    [facility] [address], [place], [place]  [address] Phone: [phone]; Fax: [phone]    

## 2018-10-18 NOTE — Telephone Encounter (Signed)
-----   Message from Beatrice LecherScott T Weaver, PA-C sent at 10/18/2018  4:22 PM EST ----- Mg2+ normal.  K+ is low.  Creatinine increase some but overall stable.  LFTs normal.   Recommendations:  - Take a total of 40 mEq of K+ today   - Then increase daily K+ to 20 mEq QD (starting tomorrow)   - BMET 1 week Tereso NewcomerScott Weaver, PA-C    10/18/2018 4:18 PM

## 2018-10-18 NOTE — Telephone Encounter (Signed)
Notes recorded by Sigurd Sosapp, Laurens Matheny, RN on 10/18/2018 at 4:33 PM EST The patient has been notified of the result and verbalized understanding. All questions (if any) were answered. Sigurd SosMichael Terrel Nesheiwat, RN 10/18/2018 4:33 PM   ------

## 2018-10-18 NOTE — Patient Instructions (Addendum)
Medication Instructions:  Your physician recommends that you continue on your current medications as directed. Please refer to the Current Medication list given to you today.  If you need a refill on your cardiac medications before your next appointment, please call your pharmacy.   Lab work: TODAY: CMET & MAGNESIUM  Your physician recommends that you return for a FASTING lipid profile on the same day you have your stress test   If you have labs (blood work) drawn today and your tests are completely normal, you will receive your results only by: [name]. MyChart Message (if you have MyChart) OR . A paper copy in the mail If you have any lab test that is abnormal or we need to change your treatment, we will call you to review the results.  Testing/Procedures: Your physician has requested that you have a lexiscan myoview. For further information please visit [email or web address]. Please follow instruction sheet, as given.  Follow-Up: At [facility], you and your health needs are our priority.  As part of our continuing mission to provide you with exceptional heart care, we have created designated Provider Care Teams.  These Care Teams include your primary Cardiologist (physician) and Advanced Practice Providers (APPs -  Physician Assistants and Nurse Practitioners) who all work together to provide you with the care you need, when you need it. You will need a follow up appointment in:  6 months.  Please call our office 2 months in advance to schedule this appointment.  You may see Tonny BollmanMichael Cooper, MD or one of the following Advanced Practice Providers on your designated Care Team: Tereso NewcomerScott Weaver, PA-C  Any Other Special Instructions Will Be Listed Below (If Applicable).

## 2018-10-25 ENCOUNTER — Other Ambulatory Visit: Payer: Medicare Other | Admitting: *Deleted

## 2018-10-25 DIAGNOSIS — Z79899 Other long term (current) drug therapy: Secondary | ICD-10-CM

## 2018-10-26 ENCOUNTER — Telehealth: Payer: Self-pay

## 2018-10-26 DIAGNOSIS — I10 Essential (primary) hypertension: Secondary | ICD-10-CM

## 2018-10-26 DIAGNOSIS — Z79899 Other long term (current) drug therapy: Secondary | ICD-10-CM

## 2018-10-26 DIAGNOSIS — I2510 Atherosclerotic heart disease of native coronary artery without angina pectoris: Secondary | ICD-10-CM

## 2018-10-26 DIAGNOSIS — I5022 Chronic systolic (congestive) heart failure: Secondary | ICD-10-CM

## 2018-10-26 LAB — BASIC METABOLIC PANEL
BUN/Creatinine Ratio: 9 — ABNORMAL LOW (ref 12–28)
BUN: 11 mg/dL (ref 8–27)
CO2: 24 mmol/L (ref 20–29)
Calcium: 9.5 mg/dL (ref 8.7–10.3)
Chloride: 104 mmol/L (ref 96–106)
Creatinine, Ser: 1.19 mg/dL — ABNORMAL HIGH (ref 0.57–1.00)
GFR calc Af Amer: 50 mL/min/{1.73_m2} — ABNORMAL LOW (ref >59)
GFR calc non Af Amer: 44 mL/min/{1.73_m2} — ABNORMAL LOW (ref >59)
Glucose: 119 mg/dL — ABNORMAL HIGH (ref 65–99)
Potassium: 3.7 mmol/L (ref 3.5–5.2)
Sodium: 144 mmol/L (ref 134–144)

## 2018-10-26 MED ORDER — POTASSIUM CHLORIDE CRYS ER 20 MEQ PO TBCR: 30.0000 meq | EXTENDED_RELEASE_TABLET | Freq: Every day | ORAL | 3 refills | Status: DC

## 2018-10-26 NOTE — Telephone Encounter (Signed)
-----   Message from Beatrice LecherScott T Weaver, PA-C sent at 10/26/2018 10:46 AM EST ----- Creatinine stable.  K+ improved and now normal.  But, would like to see K+ 4. Recommendations:  - Increase K+ to 30 mEq QD  - Repeat BMET at time of Myoview on 1/16 Tereso NewcomerScott Weaver, PA-C    10/26/2018 10:44 AM

## 2018-10-26 NOTE — Telephone Encounter (Signed)
Reviewed results and recommendations with patient who verbalized understanding. Per Tereso NewcomerScott Weaver to increase K+ to 30 mEq daily and repeat Bmet at time of Myoview 1/6. Orders have been placed in epic.

## 2018-11-01 ENCOUNTER — Telehealth (HOSPITAL_COMMUNITY): Payer: Self-pay

## 2018-11-01 NOTE — Telephone Encounter (Signed)
Pt contacted and detailed instructions were given. Pt stated that she understood and would be here. S.Breeze Angell EMTP

## 2018-11-03 ENCOUNTER — Ambulatory Visit (HOSPITAL_COMMUNITY): Payer: Medicare Other | Attending: Internal Medicine

## 2018-11-03 ENCOUNTER — Other Ambulatory Visit: Payer: Medicare Other | Admitting: *Deleted

## 2018-11-03 ENCOUNTER — Telehealth: Payer: Self-pay

## 2018-11-03 DIAGNOSIS — I2510 Atherosclerotic heart disease of native coronary artery without angina pectoris: Secondary | ICD-10-CM | POA: Diagnosis present

## 2018-11-03 DIAGNOSIS — Z79899 Other long term (current) drug therapy: Secondary | ICD-10-CM

## 2018-11-03 DIAGNOSIS — I10 Essential (primary) hypertension: Secondary | ICD-10-CM

## 2018-11-03 DIAGNOSIS — I5022 Chronic systolic (congestive) heart failure: Secondary | ICD-10-CM

## 2018-11-03 DIAGNOSIS — R079 Chest pain, unspecified: Secondary | ICD-10-CM

## 2018-11-03 DIAGNOSIS — R7989 Other specified abnormal findings of blood chemistry: Secondary | ICD-10-CM

## 2018-11-03 LAB — MYOCARDIAL PERFUSION IMAGING
LV dias vol: 91 mL (ref 46–106)
LV sys vol: 49 mL
Peak HR: 75 {beats}/min
Rest HR: 62 {beats}/min
SDS: 0
SRS: 11
SSS: 12
TID: 1.06

## 2018-11-03 LAB — BASIC METABOLIC PANEL
BUN/Creatinine Ratio: 14 (ref 12–28)
BUN: 22 mg/dL (ref 8–27)
CO2: 22 mmol/L (ref 20–29)
Calcium: 9.6 mg/dL (ref 8.7–10.3)
Chloride: 101 mmol/L (ref 96–106)
Creatinine, Ser: 1.59 mg/dL — ABNORMAL HIGH (ref 0.57–1.00)
GFR calc non Af Amer: 31 mL/min/{1.73_m2} — ABNORMAL LOW (ref >59)
GFR, EST AFRICAN AMERICAN: 35 mL/min/{1.73_m2} — AB (ref >59)
Glucose: 131 mg/dL — ABNORMAL HIGH (ref 65–99)
Potassium: 4.8 mmol/L (ref 3.5–5.2)
Sodium: 140 mmol/L (ref 134–144)

## 2018-11-03 MED ORDER — TECHNETIUM TC 99M TETROFOSMIN IV KIT
11.0000 | PACK | Freq: Once | INTRAVENOUS | Status: AC | PRN
  Administered 2018-11-03: 11 via INTRAVENOUS
  Filled 2018-11-03: qty 11

## 2018-11-03 MED ORDER — REGADENOSON 0.4 MG/5ML IV SOLN
0.4000 mg | Freq: Once | INTRAVENOUS | Status: AC
  Administered 2018-11-03: 0.4 mg via INTRAVENOUS

## 2018-11-03 MED ORDER — TECHNETIUM TC 99M TETROFOSMIN IV KIT
31.6000 | PACK | Freq: Once | INTRAVENOUS | Status: AC | PRN
  Administered 2018-11-03: 31.6 via INTRAVENOUS
  Filled 2018-11-03: qty 32

## 2018-11-03 NOTE — Telephone Encounter (Signed)
-----   Message from Beatrice Lecher, PA-C sent at 11/03/2018  4:47 PM EST ----- K+ normal.  Creatinine increased some. Recommendations:  - Hold Lasix x 1 day  - Repeat BMET 1 week. Tereso Newcomer, PA-C    11/03/2018 4:46 PM

## 2018-11-03 NOTE — Telephone Encounter (Signed)
Patient aware of lab results. Per Tereso Newcomer PA, K+ normal. Creatinine increased some.Recommendations: - Hold Lasix x 1 day- Repeat BMET 1 week. Patient verbalized understanding. She will come in on 11/10/18 for lab work.

## 2018-11-04 ENCOUNTER — Encounter: Payer: Self-pay | Admitting: Physician Assistant

## 2018-11-04 ENCOUNTER — Telehealth: Payer: Self-pay

## 2018-11-04 NOTE — Telephone Encounter (Signed)
Reviewed results with patient who verbalized understanding. Also made pt aware of lab appt on 1/23. Pt thanked me for the call.

## 2018-11-04 NOTE — Telephone Encounter (Signed)
-----   Message from Beatrice LecherScott T Weaver, PA-C sent at 11/04/2018  8:33 AM EST ----- Please call patient. Her stress test shows evidence of her old heart attack.  There is no evidence of a new blockage ("no ischemia").  Her heart function (ejection fraction) is improved at 46%.  It was previously 35-40% on Echo in 2014. Recommendations:  - Continue current medications and follow up as planned.   - Send copy to PCP Tereso NewcomerScott Weaver, PA-C    11/04/2018 8:28 AM

## 2018-11-10 ENCOUNTER — Other Ambulatory Visit: Payer: Medicare Other | Admitting: *Deleted

## 2018-11-10 DIAGNOSIS — R7989 Other specified abnormal findings of blood chemistry: Secondary | ICD-10-CM

## 2018-11-11 ENCOUNTER — Telehealth: Payer: Self-pay | Admitting: Cardiovascular Disease

## 2018-11-11 LAB — BASIC METABOLIC PANEL
BUN/Creatinine Ratio: 11 — ABNORMAL LOW (ref 12–28)
BUN: 14 mg/dL (ref 8–27)
CO2: 25 mmol/L (ref 20–29)
Calcium: 9.5 mg/dL (ref 8.7–10.3)
Chloride: 103 mmol/L (ref 96–106)
Creatinine, Ser: 1.24 mg/dL — ABNORMAL HIGH (ref 0.57–1.00)
GFR, EST AFRICAN AMERICAN: 48 mL/min/{1.73_m2} — AB (ref >59)
GFR, EST NON AFRICAN AMERICAN: 41 mL/min/{1.73_m2} — AB (ref >59)
Glucose: 95 mg/dL (ref 65–99)
Potassium: 4 mmol/L (ref 3.5–5.2)
Sodium: 144 mmol/L (ref 134–144)

## 2018-11-11 NOTE — Telephone Encounter (Signed)
Returned pt's and reviewed results with patient who verbalized understanding.

## 2018-11-11 NOTE — Telephone Encounter (Signed)
New message  ° ° ° °Pt returning call about lab results  °

## 2018-12-03 ENCOUNTER — Other Ambulatory Visit: Payer: Self-pay | Admitting: Physician Assistant

## 2019-04-02 ENCOUNTER — Other Ambulatory Visit: Payer: Self-pay | Admitting: Cardiovascular Disease

## 2019-04-02 DIAGNOSIS — I5022 Chronic systolic (congestive) heart failure: Secondary | ICD-10-CM

## 2019-04-02 DIAGNOSIS — R0602 Shortness of breath: Secondary | ICD-10-CM

## 2019-04-03 ENCOUNTER — Other Ambulatory Visit: Payer: Self-pay | Admitting: Physician Assistant

## 2019-04-03 MED ORDER — FUROSEMIDE 40 MG PO TABS: 20.0000 mg | ORAL_TABLET | Freq: Every day | ORAL | 1 refills | Status: DC

## 2019-05-02 ENCOUNTER — Telehealth: Payer: Self-pay | Admitting: Cardiovascular Disease

## 2019-05-02 NOTE — Telephone Encounter (Signed)
error 

## 2019-05-03 ENCOUNTER — Ambulatory Visit: Payer: Medicare Other | Admitting: Cardiovascular Disease

## 2019-05-31 ENCOUNTER — Encounter: Payer: Self-pay | Admitting: Physician Assistant

## 2019-05-31 ENCOUNTER — Other Ambulatory Visit: Payer: Self-pay

## 2019-05-31 ENCOUNTER — Ambulatory Visit (INDEPENDENT_AMBULATORY_CARE_PROVIDER_SITE_OTHER): Payer: Medicare Other | Admitting: Physician Assistant

## 2019-05-31 VITALS — BP 114/60 | HR 79 | Ht 65.0 in | Wt 206.0 lb

## 2019-05-31 DIAGNOSIS — I5022 Chronic systolic (congestive) heart failure: Secondary | ICD-10-CM | POA: Diagnosis not present

## 2019-05-31 DIAGNOSIS — I2510 Atherosclerotic heart disease of native coronary artery without angina pectoris: Secondary | ICD-10-CM

## 2019-05-31 DIAGNOSIS — I10 Essential (primary) hypertension: Secondary | ICD-10-CM | POA: Diagnosis not present

## 2019-05-31 DIAGNOSIS — E7849 Other hyperlipidemia: Secondary | ICD-10-CM | POA: Diagnosis not present

## 2019-05-31 NOTE — Progress Notes (Signed)
Cardiology Office Note:    Date:  05/31/2019   ID:  [name], [name] 01/09/1941, MRN [id number]  PCP:  Nolene Ebbs, MD  Cardiologist:  Sherren Mocha, MD  Electrophysiologist:  None   Referring MD: Nolene Ebbs, MD   Chief Complaint  Patient presents with  . Follow-up    CAD    History of Present Illness:    [name] is a 78 y.o. female with:  Coronary artery disease  S/p NSTEMI >>  CABG in 2009  Myoview 10/2018: ant-sept, apical infarct, no ischemia  Chronic systolic CHF  Ischemic cardiomyopathy  Eval by Dr. 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 [name] was last seen in December 2019.  She complained of chest discomfort and left arm pain at that time.  A Myoview was obtained and demonstrated EF 46%, old scar but no ischemia.  She returns for follow-up.  She is here alone.  She has not had any chest discomfort.  She has chronic dyspnea and this is unchanged.  She has not had PND, significant leg swelling or syncope.  Prior CV studies:   The following studies were reviewed today:  Myoview 11/03/2018 Abnormal, low risk stress nuclear study with prior anteroseptal and apical infarct; no ischemia; EF 46 with akinesis of the apex.  Echo (06/23/13):  Mild focal basal septal hypertrophy, EF 35-40%, anteroseptal and apical HK, grade 2 diastolic dysfunction, mild MR, reduced RV systolic function, moderate TR, PASP 47.   Echocardiogram (5/14):  Moderate LVH, EF 30-35%, anteroseptal and apical HK.   Myoview (11/04/11):  Small inf scar, mod ant apical and septal scar, no ischemia, EF 31%. Med Rx continued.  Past Medical History:  Diagnosis Date  . CAD (coronary artery disease)    s/p NSTEMI 2009 - s/p CABG 2009: L-LAD, S-OM1 and OM2, S-Dx, S-PDA.;  Myoview 11/04/11:  Small inf scar,  mod ant apical and septal scar, no ischemia, EF 31%.// Myoview 10/2018:  EF 46, ant-sept and apical infarct; no ischemia, apical AK; Low Risk   . Cardiomyopathy, ischemic    a.  LVEF 35-40% // b. Echo 5/14: Moderate LVH, EF 30-35%, mid to distal ant-septal, apical HK // c. Echo  9/14: Mild focal basal septal hypertrophy, EF 35-40%, anteroseptal and apical HK, grade 2 diastolic dysfunction, mild MR, reduced RV systolic function, moderate TR, PASP 47.      [name] Chronic systolic heart failure ([place])   . HIT (heparin-induced thrombocytopenia) ([name])   . HLD (hyperlipidemia)   . HTN (hypertension)   . Other emphysema ([name])    Surgical Hx: The patient  has a past surgical history that includes Coronary artery bypass graft (November 21, 2007).   Current Medications: Current Meds  Medication Sig  . acetaminophen-codeine (TYLENOL #3) 300-30 MG per tablet Take 1 tablet by mouth 2 (two) times daily as needed for severe pain.   [name] albuterol (PROVENTIL HFA;VENTOLIN HFA) 108 (90 BASE) MCG/ACT inhaler 1-2 puffs every 6 (six) hours as needed (wheezing). 1-2 puffs every 4-6 hours as needed  . albuterol (PROVENTIL) (5 MG/ML) 0.5% nebulizer solution Take 2.5 mg by nebulization every 2 (two) hours as needed for wheezing or shortness of breath.  . allopurinol (ZYLOPRIM) 100 MG tablet Take 100 mg by mouth daily.   [name] aspirin 81 MG  tablet Take 81 mg by mouth daily.   [name]. BIDIL 20-37.5 MG tablet TAKE 1 TABLET BY MOUTH THREE TIMES DAILY  . budesonide-formoterol (SYMBICORT) 160-4.5 MCG/ACT inhaler Inhale 2 puffs into the lungs 2 (two) times daily.  . carvedilol (COREG) 6.25 MG tablet TAKE 1 TABLET(6.25 MG) BY MOUTH TWICE DAILY WITH A MEAL  . colchicine 0.6 MG tablet Take 0.6 mg by mouth 2 (two) times daily as needed. For gout  . ENTRESTO 24-26 MG TAKE 1 TABLET BY MOUTH TWICE DAILY. DISCONTINUE LISINOPRIL MEDICATION  . famotidine (PEPCID) 20 MG tablet Take 1 tablet (20 mg total) by mouth 2 (two) times daily. Take one tablet  twice daily for two days  . furosemide (LASIX) 40 MG tablet Take 0.5 tablets (20 mg total) by mouth daily.  [name]. ipratropium (ATROVENT) 0.02 % nebulizer solution Take 0.5 mg by nebulization 4 (four) times daily.  . Multiple Vitamin (MULTIVITAMIN) tablet Take 1 tablet by mouth daily.   . nitroGLYCERIN (NITROSTAT) 0.4 MG SL tablet PLACE 1 TABLET UNDER TONGUE EVERY 5 MINUTES AS NEEDED FOR CHEST PAIN  . omeprazole (PRILOSEC) 10 MG capsule Take 1 capsule (10 mg total) by mouth daily.  . potassium chloride SA (K-DUR) 20 MEQ tablet Take 20 mEq by mouth daily.  . rosuvastatin (CRESTOR) 40 MG tablet Take 1 tablet (40 mg total) by mouth daily.  [name]. spironolactone (ALDACTONE) 25 MG tablet Take 1 tablet (25 mg total) by mouth daily.  [name]. tiotropium (SPIRIVA) 18 MCG inhalation capsule Place 18 mcg into inhaler and inhale daily.     Allergies:   Patient has no known allergies.   Social History   Tobacco Use  . Smoking status: Former Smoker    Packs/day: 1.00    Years: 44.00    Pack years: 44.00    Types: Cigarettes    Quit date: 10/20/2007    Years since quitting: 11.6  . Smokeless tobacco: Never Used  . Tobacco comment: started at age [age]s.   Substance Use Topics  . Alcohol use: No  . Drug use: No     Family Hx: The patient's family history includes Asthma in her mother; Diabetes in her mother; Hypertension in her mother; Rheum arthritis in her mother.  ROS:   Please see the history of present illness.    Review of Systems  All other systems reviewed and are negative.  All other systems reviewed and are negative.   EKGs/Labs/Other Test Reviewed:    EKG:  EKG is  ordered today.  The ekg ordered today demonstrates normal sinus rhythm, heart rate 79, left axis deviation, right bundle branch block, QTC 447, no change from prior tracing  Recent Labs: 10/18/2018: ALT 13; Magnesium 1.9 11/10/2018: BUN 14; Creatinine, Ser 1.24; Potassium 4.0; Sodium 144   Recent Lipid Panel Lab Results  Component  Value Date/Time   CHOL 119 10/20/2017 12:04 PM   TRIG 140 10/20/2017 12:04 PM   HDL 37 (L) 10/20/2017 12:04 PM   CHOLHDL 3.2 10/20/2017 12:04 PM   CHOLHDL 3 01/29/2014 11:37 AM   LDLCALC 54 10/20/2017 12:04 PM    Physical Exam:    VS:  BP 114/60   Pulse 79   Ht 5\' 5"  (1.651 m)   Wt 206 lb (93.4 kg)   BMI 34.28 kg/m     Wt Readings from Last 3 Encounters:  05/31/19 206 lb (93.4 kg)  11/03/18 208 lb (94.3 kg)  10/18/18 208 lb (94.3 kg)     Physical Exam  Constitutional:  She is oriented to person, place, and time. She appears well-developed and well-nourished. No distress.  HENT:  Head: Normocephalic and atraumatic.  Eyes: No scleral icterus.  Neck: No JVD present. No thyromegaly present.  Cardiovascular: Normal rate, regular rhythm and normal heart sounds.  No murmur heard. Pulmonary/Chest: Effort normal. She has decreased breath sounds. She has no rales.  Abdominal: Soft. There is no hepatomegaly.  Musculoskeletal:        General: No edema.  Lymphadenopathy:    She has no cervical adenopathy.  Neurological: She is alert and oriented to person, place, and time.  Skin: Skin is warm and dry.  Psychiatric: She has a normal mood and affect.    ASSESSMENT & PLAN:    1. Coronary artery disease involving native coronary artery of native heart without angina pectoris History of myocardial infarction in 2009 treated with CABG.  Recent Myoview January 2020 with scar but no ischemia.  She is doing well without anginal symptoms.  Continue aspirin, statin.  2. Chronic systolic heart failure (HCC) EF 46 by recent nuclear study.  She is NYHA 3.  Her dyspnea is mainly related to COPD.  Volume status appears stable.  Continue isosorbide/hydralazine, carvedilol, Entresto, spironolactone.  3. Essential hypertension The patient's blood pressure is controlled on her current regimen.  Continue current therapy.   4. Other hyperlipidemia Continue high-dose statin therapy.  Arrange  fasting CMP, lipids.   Dispo:  Return in about 6 months (around 12/01/2019) for Routine Follow Up, w/ Dr. Excell Seltzerooper, or Tereso NewcomerScott Weaver, PA-C.   Medication Adjustments/Labs and Tests Ordered: Current medicines are reviewed at length with the patient today.  Concerns regarding medicines are outlined above.  Tests Ordered: Orders Placed This Encounter  Procedures  . EKG 12-Lead   Medication Changes: No orders of the defined types were placed in this encounter.   Signed, Tereso NewcomerScott Weaver, PA-C  05/31/2019 5:08 PM    [facility] [address], [place], [place]  [address] Phone: [phone]; Fax: [phone]

## 2019-05-31 NOTE — Patient Instructions (Signed)
Medication Instructions:  none If you need a refill on your cardiac medications before your next appointment, please call your pharmacy.   Lab work:  Call to schedule [phone] Fasting Lipid  CMET If you have labs (blood work) drawn today and your tests are completely normal, you will receive your results only by: [name] MyChart Message (if you have MyChart) OR . A paper copy in the mail If you have any lab test that is abnormal or we need to change your treatment, we will call you to review the results.  Testing/Procedures: NONE  Follow-Up: At [facility], you and your health needs are our priority.  As part of our continuing mission to provide you with exceptional heart care, we have created designated Provider Care Teams.  These Care Teams include your primary Cardiologist (physician) and Advanced Practice Providers (APPs -  Physician Assistants and Nurse Practitioners) who all work together to provide you with the care you need, when you need it. You will need a follow up appointment in:  6 months.  Please call our office 2 months in advance to schedule this appointment.  You may see Sherren Mocha, MD or one of the following Advanced Practice Providers on your designated Care Team: Richardson Dopp, PA-C [place], [place] . Daune Perch, NP  Any Other Special Instructions Will Be Listed Below (If Applicable).

## 2019-06-02 ENCOUNTER — Telehealth: Payer: Self-pay

## 2019-06-02 DIAGNOSIS — E785 Hyperlipidemia, unspecified: Secondary | ICD-10-CM

## 2019-06-02 NOTE — Telephone Encounter (Signed)
Lpmtcb 8/14 for lab appointment

## 2019-06-02 NOTE — Telephone Encounter (Signed)
I spoke to the patient and scheduled labs for 8/26 (FLP/CMET).  She verbalized understanding.

## 2019-06-14 ENCOUNTER — Other Ambulatory Visit: Payer: Medicare Other | Admitting: *Deleted

## 2019-06-14 ENCOUNTER — Other Ambulatory Visit: Payer: Self-pay

## 2019-06-14 DIAGNOSIS — E785 Hyperlipidemia, unspecified: Secondary | ICD-10-CM

## 2019-06-15 LAB — COMPREHENSIVE METABOLIC PANEL
ALT: 16 IU/L (ref 0–32)
AST: 20 IU/L (ref 0–40)
Albumin/Globulin Ratio: 1.7 (ref 1.2–2.2)
Albumin: 4.5 g/dL (ref 3.7–4.7)
Alkaline Phosphatase: 80 IU/L (ref 39–117)
BUN/Creatinine Ratio: 14 (ref 12–28)
BUN: 18 mg/dL (ref 8–27)
Bilirubin Total: 0.8 mg/dL (ref 0.0–1.2)
CO2: 25 mmol/L (ref 20–29)
Calcium: 9.6 mg/dL (ref 8.7–10.3)
Chloride: 99 mmol/L (ref 96–106)
Creatinine, Ser: 1.32 mg/dL — ABNORMAL HIGH (ref 0.57–1.00)
GFR calc Af Amer: 44 mL/min/{1.73_m2} — ABNORMAL LOW (ref >59)
GFR calc non Af Amer: 38 mL/min/{1.73_m2} — ABNORMAL LOW (ref >59)
Globulin, Total: 2.7 g/dL (ref 1.5–4.5)
Glucose: 121 mg/dL — ABNORMAL HIGH (ref 65–99)
Potassium: 4.4 mmol/L (ref 3.5–5.2)
Sodium: 141 mmol/L (ref 134–144)
Total Protein: 7.2 g/dL (ref 6.0–8.5)

## 2019-06-15 LAB — LIPID PANEL
Chol/HDL Ratio: 2.9 ratio (ref 0.0–4.4)
Cholesterol, Total: 100 mg/dL (ref 100–199)
HDL: 34 mg/dL — ABNORMAL LOW (ref >39)
LDL Calculated: 41 mg/dL (ref 0–99)
Triglycerides: 125 mg/dL (ref 0–149)
VLDL Cholesterol Cal: 25 mg/dL (ref 5–40)

## 2019-08-30 ENCOUNTER — Other Ambulatory Visit: Payer: Self-pay | Admitting: Physician Assistant

## 2019-09-29 ENCOUNTER — Other Ambulatory Visit: Payer: Self-pay | Admitting: Cardiovascular Disease

## 2019-09-29 ENCOUNTER — Other Ambulatory Visit: Payer: Self-pay | Admitting: Physician Assistant

## 2019-09-29 DIAGNOSIS — R0602 Shortness of breath: Secondary | ICD-10-CM

## 2019-09-29 DIAGNOSIS — I5022 Chronic systolic (congestive) heart failure: Secondary | ICD-10-CM

## 2019-11-27 NOTE — Progress Notes (Signed)
Cardiology Office Note:    Date:  11/28/2019   ID:  Daryn, Pisani [birth year], MRN [id number]  PCP:  [name], MD  Cardiologist:  Tonny Bollman, MD   Electrophysiologist:  None   Referring MD: Fleet Contras, MD   Chief Complaint:  Follow-up (CAD, CHF)    Patient Profile:    [name] is a 79 y.o. female with:   Coronary artery disease ? S/p NSTEMI >>  CABG in 2009 ? Myoview 10/2018: ant-sept, apical infarct, no ischemia  Chronic systolic CHF ? Ischemic cardiomyopathy ? Eval by Dr. Graciela Husbands in past for ?ICD; Meds titrated >> Echo 9/14: EF 35-40; no ICD ? EF 46 by Myoview in 10/2018  Hypertension  Hyperlipidemia  COPD with chronic dyspnea  Thrombocytopenia ? 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  Echo (06/23/13):  Mild focal basal septal hypertrophy, EF 35-40%, anteroseptal and apical HK, grade 2 diastolic dysfunction, mild MR, reduced RV systolic function, moderate TR, PASP 47.   Echocardiogram (5/14):  Moderate LVH, EF 30-35%, anteroseptal and apical HK.   Myoview (11/04/11):  Small inf scar, mod ant apical and septal scar, no ischemia, EF 31%. Med Rx continued.  History of Present Illness:    [name] was last seen by me in clinic in 05/2019.    She returns for follow-up.  She is here alone.  She has chronic shortness of breath related to COPD.  The weather is much warmer today than it was in the last few days.  This typically causes worsening shortness of breath for her.  She has not had orthopnea, paroxysmal nocturnal dyspnea, leg swelling.  She has not had syncope or near syncope.  She has not had chest discomfort.     Past Medical History:  Diagnosis Date  . CAD (coronary artery disease)    s/p NSTEMI 2009 - s/p CABG 2009: L-LAD, S-OM1 and OM2, S-Dx, S-PDA.;  Myoview 11/04/11:  Small  inf scar, mod ant apical and septal scar, no ischemia, EF 31%.// Myoview 10/2018:  EF 46, ant-sept and apical infarct; no ischemia, apical AK; Low Risk   . Cardiomyopathy, ischemic    a.  LVEF 35-40% // b. Echo 5/14: Moderate LVH, EF 30-35%, mid to distal ant-septal, apical HK // c. Echo  9/14: Mild focal basal septal hypertrophy, EF 35-40%, anteroseptal and apical HK, grade 2 diastolic dysfunction, mild MR, reduced RV systolic function, moderate TR, PASP 47.      [name] Chronic systolic heart failure (HCC)   . HIT (heparin-induced thrombocytopenia) (HCC)   . HLD (hyperlipidemia)   . HTN (hypertension)   . Other emphysema (HCC)     Current Medications: Current Meds  Medication Sig  . acetaminophen-codeine (TYLENOL #3) 300-30 MG per tablet Take 1 tablet by mouth 2 (two) times daily as needed for severe pain.   [name] albuterol (PROVENTIL HFA;VENTOLIN HFA) 108 (90 BASE) MCG/ACT inhaler 1-2 puffs every 6 (six) hours as needed (wheezing). 1-2 puffs every 4-6 hours as needed  . albuterol (PROVENTIL) (5 MG/ML) 0.5% nebulizer solution Take 2.5 mg by nebulization every 2 (two) hours as needed for wheezing or shortness of breath.  . allopurinol (ZYLOPRIM) 100 MG tablet Take 100 mg by mouth daily.   [name] aspirin 81 MG tablet Take 81 mg by mouth daily.   [name] BIDIL 20-37.5 MG  tablet TAKE 1 TABLET BY MOUTH THREE TIMES DAILY  . budesonide-formoterol (SYMBICORT) 160-4.5 MCG/ACT inhaler Inhale 2 puffs into the lungs 2 (two) times daily.  . carvedilol (COREG) 6.25 MG tablet TAKE 1 TABLET(6.25 MG) BY MOUTH TWICE DAILY WITH A MEAL  . colchicine 0.6 MG tablet Take 0.6 mg by mouth 2 (two) times daily as needed. For gout  . ENTRESTO 24-26 MG TAKE 1 TABLET BY MOUTH TWICE DAILY. DISCONTINUE LISINOPRIL MEDICATION  . famotidine (PEPCID) 20 MG tablet Take 1 tablet (20 mg total) by mouth 2 (two) times daily. Take one tablet twice daily for two days  . furosemide (LASIX) 40 MG tablet TAKE 1/2 TABLET(20 MG) BY MOUTH DAILY  . ipratropium  (ATROVENT) 0.02 % nebulizer solution Take 0.5 mg by nebulization 4 (four) times daily.  . Multiple Vitamin (MULTIVITAMIN) tablet Take 1 tablet by mouth daily.   . nitroGLYCERIN (NITROSTAT) 0.4 MG SL tablet PLACE 1 TABLET UNDER TONGUE EVERY 5 MINUTES AS NEEDED FOR CHEST PAIN  . omeprazole (PRILOSEC) 10 MG capsule Take 1 capsule (10 mg total) by mouth daily.  . potassium chloride SA (K-DUR) 20 MEQ tablet Take 20 mEq by mouth daily.  . rosuvastatin (CRESTOR) 40 MG tablet TAKE 1 TABLET(40 MG) BY MOUTH DAILY  . spironolactone (ALDACTONE) 25 MG tablet Take 1 tablet (25 mg total) by mouth daily.  [name] tiotropium (SPIRIVA) 18 MCG inhalation capsule Place 18 mcg into inhaler and inhale daily.  . traMADol (ULTRAM) 50 MG tablet Take 50 mg by mouth every 6 (six) hours as needed.     Allergies:   Patient has no known allergies.   Social History   Tobacco Use  . Smoking status: Former Smoker    Packs/day: 1.00    Years: 44.00    Pack years: 44.00    Types: Cigarettes    Quit date: 10/20/2007    Years since quitting: 12.1  . Smokeless tobacco: Never Used  . Tobacco comment: started at age 22s.   Substance Use Topics  . Alcohol use: No  . Drug use: No     Family Hx: The patient's family history includes Asthma in her mother; Diabetes in her mother; Hypertension in her mother; Rheum arthritis in her mother.  Review of Systems  Gastrointestinal: Negative for hematochezia and melena.     EKGs/Labs/Other Test Reviewed:    EKG:  EKG is not ordered today.  The ekg ordered today demonstrates n/a  Recent Labs: 06/14/2019: ALT 16; BUN 18; Creatinine, Ser 1.32; Potassium 4.4; Sodium 141   Recent Lipid Panel Lab Results  Component Value Date/Time   CHOL 100 06/14/2019 12:50 PM   TRIG 125 06/14/2019 12:50 PM   HDL 34 (L) 06/14/2019 12:50 PM   CHOLHDL 2.9 06/14/2019 12:50 PM   CHOLHDL 3 01/29/2014 11:37 AM   LDLCALC 41 06/14/2019 12:50 PM    Physical Exam:    VS:  BP 112/60   Pulse 83   Ht 5'  5" (1.651 m)   Wt 188 lb 12.8 oz (85.6 kg)   SpO2 94%   BMI 31.42 kg/m     Wt Readings from Last 3 Encounters:  11/28/19 188 lb 12.8 oz (85.6 kg)  05/31/19 206 lb (93.4 kg)  11/03/18 208 lb (94.3 kg)     Constitutional:      Appearance: Healthy appearance. Not in distress.  Neck:     Thyroid: Thyroid normal.     Vascular: JVD normal.  Pulmonary:     Effort: Pulmonary effort  is normal.     Breath sounds: No wheezing. No rales.  Cardiovascular:     Normal rate. Regular rhythm.     Murmurs: There is no murmur.     No gallop.  Edema:    Peripheral edema absent.  Abdominal:     Palpations: Abdomen is soft. There is no hepatomegaly.  Skin:    General: Skin is warm and dry.  Neurological:     General: No focal deficit present.     Mental Status: Alert and oriented to person, place and time.       ASSESSMENT & PLAN:    1. Coronary artery disease involving native coronary artery of native heart without angina pectoris S/p NSTEMI in 2009 managed with CABG.  Myoview in 10/2018 demonstrated scar but no ischemia.  She has not had any angina.  Continue current regimen which includes ASA 81 mg, Carvedilol 6.25 mg twice daily, Isosorbide Dinitrate (BiDil), Rosuvastatin, Valsartan (Entresto).  Follow up with me or Dr. Burt Knack in 6 mos via Telemedicine.   2. Chronic systolic heart failure ([place]) EF 46 by Myoview in 2020.  She is NYHA IIb-III.  She is mainly limited by COPD.  Volume status is optimal.  Continue Entresto, beta-blocker (Carvedilol), Spironolactone and nitrates/hydralazine (BiDil).    3. Essential hypertension The patient's blood pressure is controlled on her current regimen.  Continue current carvedilol, isosorbide/hydralazine, Entresto, spironolactone.  4. Hyperlipidemia, unspecified hyperlipidemia type LDL optimal on most recent lab work.  Continue current Rx with rosuvastatin 40 mg daily.   Dispo:  Return in about 6 months (around 05/27/2020) for Routine Follow Up, w/ Dr.  Burt Knack, or Richardson Dopp, PA-C, via Telemedicine.   Medication Adjustments/Labs and Tests Ordered: Current medicines are reviewed at length with the patient today.  Concerns regarding medicines are outlined above.  Tests Ordered: No orders of the defined types were placed in this encounter.  Medication Changes: No orders of the defined types were placed in this encounter.   Signed, Richardson Dopp, PA-C  11/28/2019 2:39 PM    [facility] [place], [place], [place]  [address] Phone: [phone]; Fax: [phone]

## 2019-11-28 ENCOUNTER — Other Ambulatory Visit: Payer: Self-pay | Admitting: Physician Assistant

## 2019-11-28 ENCOUNTER — Ambulatory Visit (INDEPENDENT_AMBULATORY_CARE_PROVIDER_SITE_OTHER): Payer: Medicare Other | Admitting: Physician Assistant

## 2019-11-28 ENCOUNTER — Other Ambulatory Visit: Payer: Self-pay

## 2019-11-28 ENCOUNTER — Encounter: Payer: Self-pay | Admitting: Physician Assistant

## 2019-11-28 VITALS — BP 112/60 | HR 83 | Ht 65.0 in | Wt 188.8 lb

## 2019-11-28 DIAGNOSIS — I2510 Atherosclerotic heart disease of native coronary artery without angina pectoris: Secondary | ICD-10-CM

## 2019-11-28 DIAGNOSIS — E785 Hyperlipidemia, unspecified: Secondary | ICD-10-CM

## 2019-11-28 DIAGNOSIS — I5022 Chronic systolic (congestive) heart failure: Secondary | ICD-10-CM | POA: Diagnosis not present

## 2019-11-28 DIAGNOSIS — I10 Essential (primary) hypertension: Secondary | ICD-10-CM

## 2019-11-28 NOTE — Patient Instructions (Signed)
Medication Instructions:   Your physician recommends that you continue on your current medications as directed. Please refer to the Current Medication list given to you today.  *If you need a refill on your cardiac medications before your next appointment, please call your pharmacy*  Lab Work:  None ordered today  If you have labs (blood work) drawn today and your tests are completely normal, you will receive your results only by: . MyChart Message (if you have MyChart) OR . A paper copy in the mail If you have any lab test that is abnormal or we need to change your treatment, we will call you to review the results.  Testing/Procedures:  None ordered today  Follow-Up: At [facility], you and your health needs are our priority.  As part of our continuing mission to provide you with exceptional heart care, we have created designated Provider Care Teams.  These Care Teams include your primary Cardiologist (physician) and Advanced Practice Providers (APPs -  Physician Assistants and Nurse Practitioners) who all work together to provide you with the care you need, when you need it.  Your next appointment:   6 month(s)  The format for your next appointment:   In Person  Provider:   You may see Michael Cooper, MD or one of the following Advanced Practice Providers on your designated Care Team:    Scott Weaver, PA-C  Vin Bhagat, PA-C  Janine Hammond, NP     

## 2019-12-15 ENCOUNTER — Ambulatory Visit: Payer: Medicare Other | Attending: Internal Medicine

## 2019-12-15 DIAGNOSIS — Z23 Encounter for immunization: Secondary | ICD-10-CM

## 2019-12-15 NOTE — Progress Notes (Signed)
   Covid-19 Vaccination Clinic  Name:  LATALIA ETZLER    MRN: [id number] DOB: 30-Oct-1940  12/15/2019  Ms. Gaertner was observed post Covid-19 immunization for 15 minutes without incidence. She was provided with Vaccine Information Sheet and instruction to access the V-Safe system.   Ms. Scarberry was instructed to call 911 with any severe reactions post vaccine: [name] Difficulty breathing  . Swelling of your face and throat  . A fast heartbeat  . A bad rash all over your body  . Dizziness and weakness    Immunizations Administered    Name Date Dose VIS Date Route   Pfizer COVID-19 Vaccine 12/15/2019  3:01 PM 0.3 mL 09/29/2019 Intramuscular   Manufacturer: [employer], [employer]   Lot: [id number]   NDC: [address]-3

## 2019-12-18 ENCOUNTER — Other Ambulatory Visit: Payer: Self-pay

## 2019-12-18 ENCOUNTER — Ambulatory Visit (INDEPENDENT_AMBULATORY_CARE_PROVIDER_SITE_OTHER): Payer: Medicare Other | Admitting: Podiatry

## 2019-12-18 ENCOUNTER — Encounter: Payer: Self-pay | Admitting: Podiatry

## 2019-12-18 DIAGNOSIS — M79675 Pain in left toe(s): Secondary | ICD-10-CM | POA: Diagnosis not present

## 2019-12-18 DIAGNOSIS — M79674 Pain in right toe(s): Secondary | ICD-10-CM | POA: Diagnosis not present

## 2019-12-18 DIAGNOSIS — B351 Tinea unguium: Secondary | ICD-10-CM | POA: Diagnosis not present

## 2019-12-18 NOTE — Progress Notes (Signed)
Complaint:  Visit Type: Patient presents  to my office for  preventative foot care services. Complaint: Patient states" my nails have grown long and thick and become painful to walk and wear shoes" . The patient presents for preventative foot care services.  Podiatric Exam: Vascular: dorsalis pedis and posterior tibial pulses are palpable bilateral. Capillary return is immediate. Temperature gradient is WNL. Skin turgor WNL  Sensorium: Normal Semmes Weinstein monofilament test. Normal tactile sensation bilaterally. Nail Exam: Pt has thick disfigured discolored nails with subungual debris noted hallux  B/L. Ulcer Exam: There is no evidence of ulcer or pre-ulcerative changes or infection. Orthopedic Exam: Muscle tone and strength are WNL. No limitations in general ROM. No crepitus or effusions noted. Foot type and digits show no abnormalities. Bony prominences are unremarkable. Skin: No Porokeratosis. No infection or ulcers  Diagnosis:  Onychomycosis, , Pain in right toe, pain in left toes  Treatment & Plan Procedures and Treatment: Consent by patient was obtained for treatment procedures.   Debridement of mycotic and hypertrophic toenails, 1 through 5 bilateral and clearing of subungual debris. No ulceration, no infection noted.  Return Visit-Office Procedure: Patient instructed to return to the office for a follow up visit 3 months for continued evaluation and treatment.    Helane Gunther DPM

## 2019-12-28 ENCOUNTER — Other Ambulatory Visit: Payer: Self-pay | Admitting: Physician Assistant

## 2019-12-28 DIAGNOSIS — I5022 Chronic systolic (congestive) heart failure: Secondary | ICD-10-CM

## 2020-01-10 ENCOUNTER — Ambulatory Visit: Payer: Medicare Other | Attending: Internal Medicine

## 2020-01-10 DIAGNOSIS — Z23 Encounter for immunization: Secondary | ICD-10-CM

## 2020-01-10 NOTE — Progress Notes (Signed)
   Covid-19 Vaccination Clinic  Name:  [name]    MRN: [id number] DOB: Mar 11, 1941  01/10/2020  [name] was observed post Covid-19 immunization for 15 minutes without incident. She was provided with Vaccine Information Sheet and instruction to access the V-Safe system.   [name] was instructed to call 911 with any severe reactions post vaccine: [name] Difficulty breathing  . Swelling of face and throat  . A fast heartbeat  . A bad rash all over body  . Dizziness and weakness   Immunizations Administered    Name Date Dose VIS Date Route   Pfizer COVID-19 Vaccine 01/10/2020 12:50 PM 0.3 mL 09/29/2019 Intramuscular   Manufacturer: [employer], [employer]   Lot: [id number]   NDC: [address]-7

## 2020-01-29 ENCOUNTER — Other Ambulatory Visit: Payer: Self-pay

## 2020-01-29 ENCOUNTER — Emergency Department (HOSPITAL_COMMUNITY): Payer: Medicare Other

## 2020-01-29 ENCOUNTER — Encounter (HOSPITAL_COMMUNITY): Payer: Self-pay | Admitting: Emergency Medicine

## 2020-01-29 ENCOUNTER — Emergency Department (HOSPITAL_COMMUNITY)
Admission: EM | Admit: 2020-01-29 | Discharge: 2020-01-29 | Disposition: A | Discharge status: Home / Self Care | Payer: Medicare Other | Attending: Emergency Medicine | Admitting: Emergency Medicine

## 2020-01-29 DIAGNOSIS — N899 Noninflammatory disorder of vagina, unspecified: Secondary | ICD-10-CM | POA: Diagnosis not present

## 2020-01-29 DIAGNOSIS — Z5321 Procedure and treatment not carried out due to patient leaving prior to being seen by health care provider: Secondary | ICD-10-CM | POA: Insufficient documentation

## 2020-01-29 DIAGNOSIS — M79674 Pain in right toe(s): Secondary | ICD-10-CM | POA: Diagnosis not present

## 2020-01-29 DIAGNOSIS — T1490XA Injury, unspecified, initial encounter: Secondary | ICD-10-CM

## 2020-01-29 NOTE — ED Triage Notes (Signed)
Per pt, states her last 2 toes on her right foot are causing pain-states she hit them on something-also having "jelly like stuff" coming out of either her bottom or vagina, "not sure which hole"

## 2020-03-20 ENCOUNTER — Ambulatory Visit (INDEPENDENT_AMBULATORY_CARE_PROVIDER_SITE_OTHER): Payer: Medicare Other

## 2020-03-20 ENCOUNTER — Other Ambulatory Visit: Payer: Self-pay

## 2020-03-20 ENCOUNTER — Other Ambulatory Visit: Payer: Self-pay | Admitting: Podiatry

## 2020-03-20 ENCOUNTER — Ambulatory Visit: Payer: Medicare Other

## 2020-03-20 ENCOUNTER — Encounter: Payer: Self-pay | Admitting: Podiatry

## 2020-03-20 ENCOUNTER — Ambulatory Visit (INDEPENDENT_AMBULATORY_CARE_PROVIDER_SITE_OTHER): Payer: Medicare Other | Admitting: Podiatry

## 2020-03-20 VITALS — Temp 97.7°F

## 2020-03-20 DIAGNOSIS — S92309A Fracture of unspecified metatarsal bone(s), unspecified foot, initial encounter for closed fracture: Secondary | ICD-10-CM | POA: Insufficient documentation

## 2020-03-20 DIAGNOSIS — S92334A Nondisplaced fracture of third metatarsal bone, right foot, initial encounter for closed fracture: Secondary | ICD-10-CM | POA: Diagnosis not present

## 2020-03-20 DIAGNOSIS — M79676 Pain in unspecified toe(s): Secondary | ICD-10-CM | POA: Diagnosis not present

## 2020-03-20 DIAGNOSIS — M79675 Pain in left toe(s): Secondary | ICD-10-CM

## 2020-03-20 DIAGNOSIS — M79671 Pain in right foot: Secondary | ICD-10-CM

## 2020-03-20 DIAGNOSIS — S92334D Nondisplaced fracture of third metatarsal bone, right foot, subsequent encounter for fracture with routine healing: Secondary | ICD-10-CM | POA: Diagnosis not present

## 2020-03-20 DIAGNOSIS — B351 Tinea unguium: Secondary | ICD-10-CM | POA: Diagnosis not present

## 2020-03-20 NOTE — Progress Notes (Signed)
This patient presents  to the office for evaluation and treatment of long thick painful nails .  This patient is unable to trim his own nails since the patient cannot reach his feet.  Patient says the nails are painful walking and wearing his shoes. She returns for preventive foot care services.  She states she is also experiencing pain on the top of her right foot.  She says that she initially injured her right foot and proceeded to the emergency room on 01/29/2020.  X-rays were taken and no evidence of any fracture was noted at that time.  She states she continue to walk and be active until she went to be seen by another doctor.  At [facility] [place].  They evaluated her right forefoot swelling and prescribed her prednisone for her painful foot.  She says she still is experiencing pain and discomfort walking and decided to wait until she was scheduled for her nail care at this office.  She does have swelling noted over her right forefoot.  She presents the office today for nail care as well as evaluation of her right foot pain.  General Appearance  Alert, conversant and in no acute stress.  Vascular  Dorsalis pedis and posterior tibial  pulses are palpable  bilaterally.  Capillary return is within normal limits  bilaterally. Temperature is within normal limits  bilaterally.  Neurologic  Senn-Weinstein monofilament wire test within normal limits  bilaterally. Muscle power within normal limits bilaterally.  Nails Thick disfigured discolored nails with subungual debris  from hallux to fifth toes bilaterally. No evidence of bacterial infection or drainage bilaterally.  Orthopedic  No limitations of motion  feet .  No crepitus or effusions noted.  No bony pathology or digital deformities noted.  Patient has swelling noted as well as  pain over the third MPJ and fourth MPJ right foot.    Skin  normotropic skin with no porokeratosis noted bilaterally.  No signs of infections or ulcers noted.      Onychomycosis  Pain in toes right foot  Pain in toes left foot  Closed metatarsal fracture head third metatarsal right foot.  Debridement  of nails  1-5  B/L with a nail nipper.  Nails were then filed using a dremel tool with no incidents.    X-rays taken of the right foot reveal a mild lateral deviation at the head of the third metatarsal right foot.  There is also sclerosis noted at the head of the third metatarsal right foot.  These last 2 findings indicate a fracture at the head of the third metatarsal.  Patient was dispensed a surgical shoe to be worn for the next 3 to 4 weeks to help to reduce the pain and the swelling.  As patient was walking out she said the shoe felt much better.  RTC 4 weeks.    Helane Gunther DPM

## 2020-04-03 ENCOUNTER — Telehealth: Payer: Self-pay | Admitting: Hematology and Oncology

## 2020-04-03 NOTE — Telephone Encounter (Signed)
Received a new hem referral from Dr. Allena Katz at [facility] for thrombocytopenia. [name] has been cld and scheduled to see [name] on 6/23 at 12pm. Pt aware to arrive 15 minutes early.

## 2020-04-10 ENCOUNTER — Inpatient Hospital Stay: Payer: Medicare Other | Attending: Hematology and Oncology

## 2020-04-10 ENCOUNTER — Encounter: Payer: Self-pay | Admitting: Hematology and Oncology

## 2020-04-10 ENCOUNTER — Inpatient Hospital Stay (HOSPITAL_BASED_OUTPATIENT_CLINIC_OR_DEPARTMENT_OTHER): Payer: Medicare Other | Admitting: Hematology and Oncology

## 2020-04-10 ENCOUNTER — Other Ambulatory Visit: Payer: Self-pay

## 2020-04-10 DIAGNOSIS — I2510 Atherosclerotic heart disease of native coronary artery without angina pectoris: Secondary | ICD-10-CM

## 2020-04-10 DIAGNOSIS — Z87891 Personal history of nicotine dependence: Secondary | ICD-10-CM

## 2020-04-10 DIAGNOSIS — Z951 Presence of aortocoronary bypass graft: Secondary | ICD-10-CM

## 2020-04-10 DIAGNOSIS — I252 Old myocardial infarction: Secondary | ICD-10-CM | POA: Diagnosis not present

## 2020-04-10 DIAGNOSIS — D696 Thrombocytopenia, unspecified: Secondary | ICD-10-CM

## 2020-04-10 DIAGNOSIS — E538 Deficiency of other specified B group vitamins: Secondary | ICD-10-CM | POA: Insufficient documentation

## 2020-04-10 LAB — CBC WITH DIFFERENTIAL/PLATELET
Abs Immature Granulocytes: 0.02 10*3/uL (ref 0.00–0.07)
Basophils Absolute: 0 10*3/uL (ref 0.0–0.1)
Basophils Relative: 0 %
Eosinophils Absolute: 0.1 10*3/uL (ref 0.0–0.5)
Eosinophils Relative: 1 %
HCT: 46 % (ref 36.0–46.0)
Hemoglobin: 14.3 g/dL (ref 12.0–15.0)
Immature Granulocytes: 0 %
Lymphocytes Relative: 28 %
Lymphs Abs: 2.1 10*3/uL (ref 0.7–4.0)
MCH: 28.2 pg (ref 26.0–34.0)
MCHC: 31.1 g/dL (ref 30.0–36.0)
MCV: 90.7 fL (ref 80.0–100.0)
Monocytes Absolute: 0.6 10*3/uL (ref 0.1–1.0)
Monocytes Relative: 8 %
Neutro Abs: 4.7 10*3/uL (ref 1.7–7.7)
Neutrophils Relative %: 63 %
Platelets: 102 10*3/uL — ABNORMAL LOW (ref 150–400)
RBC: 5.07 MIL/uL (ref 3.87–5.11)
RDW: 13.9 % (ref 11.5–15.5)
WBC: 7.4 10*3/uL (ref 4.0–10.5)
nRBC: 0 % (ref 0.0–0.2)

## 2020-04-10 LAB — VITAMIN B12: Vitamin B-12: 174 pg/mL — ABNORMAL LOW (ref 180–914)

## 2020-04-10 LAB — SAVE SMEAR(SSMR), FOR PROVIDER SLIDE REVIEW

## 2020-04-10 NOTE — Assessment & Plan Note (Signed)
There is no contraindication to remain on antiplatelet agents or anticoagulants as long as the platelet is greater than 50,000. ° ° °

## 2020-04-10 NOTE — Assessment & Plan Note (Signed)
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

## 2020-04-10 NOTE — Addendum Note (Signed)
Addended byBertis Ruddy, Polo Mcmartin on: 04/10/2020 02:51 PM   Modules accepted: Orders

## 2020-04-10 NOTE — Progress Notes (Addendum)
[facility] CONSULT NOTE  Patient Care Team: [name] Cagey, MD as PCP - General (Internal Medicine) [name] Bollman, MD as PCP - Cardiology (Cardiology)  CHIEF COMPLAINTS/PURPOSE OF CONSULTATION:  Chronic thrombocytopenia  HISTORY OF PRESENTING ILLNESS:  [name] 79 y.o. female is here because of thrombocytopenia.  She was found to have abnormal CBC from routine blood work I have the opportunity to review her CBC from electronic record dated back to December 2009 Her platelet count ranged between 63,000-163,000 On 03/14/2020, her platelet count came back at 100,000 The patient has history of myocardial infarction with several hospitalization for cardiac related issues over the years She denies recent bruising/bleeding, such as spontaneous epistaxis, hematuria, melena or hematochezia  The patient denies history of liver disease She has been exposed to IV heparin in the past She denies prior blood or platelet transfusions  MEDICAL HISTORY:  Past Medical History:  Diagnosis Date  . CAD (coronary artery disease)    s/p NSTEMI 2009 - s/p CABG 2009: L-LAD, S-OM1 and OM2, S-Dx, S-PDA.;  Myoview 11/04/11:  Small inf scar, mod ant apical and septal scar, no ischemia, EF 31%.// Myoview 10/2018:  EF 46, ant-sept and apical infarct; no ischemia, apical AK; Low Risk   . Cardiomyopathy, ischemic    a.  LVEF 35-40% // b. Echo 5/14: Moderate LVH, EF 30-35%, mid to distal ant-septal, apical HK // c. Echo  9/14: Mild focal basal septal hypertrophy, EF 35-40%, anteroseptal and apical HK, grade 2 diastolic dysfunction, mild MR, reduced RV systolic function, moderate TR, PASP 47.      [name] Chronic systolic heart failure (HCC)   . HIT (heparin-induced thrombocytopenia) (HCC)   . HLD (hyperlipidemia)   . HTN (hypertension)   . Other emphysema (HCC)     SURGICAL HISTORY: Past Surgical History:  Procedure Laterality Date  . CORONARY ARTERY BYPASS GRAFT  November 21, 2007   5 vessel     SOCIAL HISTORY: Social History   Socioeconomic History  . Marital status: Legally Separated    Spouse name: Not on file  . Number of children: Not on file  . Years of education: Not on file  . Highest education level: Not on file  Occupational History  . Occupation: does not currently work  Tobacco Use  . Smoking status: Former Smoker    Packs/day: 1.00    Years: 44.00    Pack years: 44.00    Types: Cigarettes    Quit date: 10/20/2007    Years since quitting: 12.4  . Smokeless tobacco: Never Used  . Tobacco comment: started at age 8s.   Vaping Use  . Vaping Use: Never used  Substance and Sexual Activity  . Alcohol use: No  . Drug use: No  . Sexual activity: Not on file  Other Topics Concern  . Not on file  Social History Narrative   Pt lives alone in [place].    Children live nearby   Social Determinants of Health   Financial Resource Strain:   . Difficulty of Paying Living Expenses:   Food Insecurity:   . Worried About Programme researcher, broadcasting/film/video in the Last Year:   . Barista in the Last Year:   Transportation Needs:   . Freight forwarder (Medical):   [name] Lack of Transportation (Non-Medical):   Physical Activity:   . Days of Exercise per Week:   . Minutes of Exercise per Session:   Stress:   . Feeling of Stress :  Social Connections:   . Frequency of Communication with Friends and Family:   . Frequency of Social Gatherings with Friends and Family:   . Attends Religious Services:   . Active Member of Clubs or Organizations:   . Attends Archivist Meetings:   [name] Marital Status:   Intimate Partner Violence:   . Fear of Current or Ex-Partner:   . Emotionally Abused:   [name] Physically Abused:   . Sexually Abused:     FAMILY HISTORY: Family History  Problem Relation Age of Onset  . Asthma Mother   . Hypertension Mother   . Diabetes Mother   . Rheum arthritis Mother     ALLERGIES:  has No Known Allergies.  MEDICATIONS:  Current  Outpatient Medications  Medication Sig Dispense Refill  . albuterol (PROVENTIL HFA;VENTOLIN HFA) 108 (90 BASE) MCG/ACT inhaler 1-2 puffs every 6 (six) hours as needed (wheezing). 1-2 puffs every 4-6 hours as needed    . albuterol (PROVENTIL) (5 MG/ML) 0.5% nebulizer solution Take 2.5 mg by nebulization every 2 (two) hours as needed for wheezing or shortness of breath.    . allopurinol (ZYLOPRIM) 100 MG tablet Take 100 mg by mouth daily.     [name] aspirin 81 MG tablet Take 81 mg by mouth daily.     [name] BIDIL 20-37.5 MG tablet TAKE 1 TABLET BY MOUTH THREE TIMES DAILY 270 tablet 2  . budesonide-formoterol (SYMBICORT) 160-4.5 MCG/ACT inhaler Inhale 2 puffs into the lungs 2 (two) times daily.    . carvedilol (COREG) 6.25 MG tablet TAKE 1 TABLET(6.25 MG) BY MOUTH TWICE DAILY WITH A MEAL 180 tablet 2  . colchicine 0.6 MG tablet Take 0.6 mg by mouth 2 (two) times daily as needed. For gout    . ENTRESTO 24-26 MG TAKE 1 TABLET BY MOUTH TWICE DAILY. DISCONTINUE LISINOPRIL MEDICATION 180 tablet 2  . famotidine (PEPCID) 20 MG tablet Take 1 tablet (20 mg total) by mouth 2 (two) times daily. Take one tablet twice daily for two days 10 tablet 0  . furosemide (LASIX) 40 MG tablet TAKE 1/2 TABLET(20 MG) BY MOUTH DAILY 45 tablet 2  . ipratropium (ATROVENT) 0.02 % nebulizer solution Take 0.5 mg by nebulization 4 (four) times daily.    . Multiple Vitamin (MULTIVITAMIN) tablet Take 1 tablet by mouth daily.     . nitroGLYCERIN (NITROSTAT) 0.4 MG SL tablet PLACE 1 TABLET UNDER TONGUE EVERY 5 MINUTES AS NEEDED FOR CHEST PAIN 75 tablet 0  . potassium chloride SA (K-DUR) 20 MEQ tablet Take 20 mEq by mouth daily.    . rosuvastatin (CRESTOR) 40 MG tablet TAKE 1 TABLET(40 MG) BY MOUTH DAILY 90 tablet 3  . spironolactone (ALDACTONE) 25 MG tablet TAKE 1 TABLET(25 MG) BY MOUTH DAILY 90 tablet 3  . tiotropium (SPIRIVA) 18 MCG inhalation capsule Place 18 mcg into inhaler and inhale daily.    . traMADol (ULTRAM) 50 MG tablet Take 50 mg  by mouth every 6 (six) hours as needed.     No current facility-administered medications for this visit.    REVIEW OF SYSTEMS:   Constitutional: Denies fevers, chills or abnormal night sweats Eyes: Denies blurriness of vision, double vision or watery eyes Ears, nose, mouth, throat, and face: Denies mucositis or sore throat Respiratory: Denies cough, dyspnea or wheezes Cardiovascular: Denies palpitation, chest discomfort or lower extremity swelling Gastrointestinal:  Denies nausea, heartburn or change in bowel habits Skin: Denies abnormal skin rashes Lymphatics: Denies new lymphadenopathy or easy bruising Neurological:Denies numbness, tingling  or new weaknesses Behavioral/Psych: Mood is stable, no new changes  All other systems were reviewed with the patient and are negative.  PHYSICAL EXAMINATION: ECOG PERFORMANCE STATUS: 0 - Asymptomatic  Vitals:   04/10/20 1236  BP: (!) 141/88  Pulse: 62  Resp: 18  Temp: 98.2 F (36.8 C)  SpO2: 98%   Filed Weights   04/10/20 1236  Weight: 176 lb 6.4 oz (80 kg)    GENERAL:alert, no distress and comfortable SKIN: skin color, texture, turgor are normal, no rashes or significant lesions EYES: normal, conjunctiva are pink and non-injected, sclera clear OROPHARYNX:no exudate, no erythema and lips, buccal mucosa, and tongue normal  NECK: supple, thyroid normal size, non-tender, without nodularity LYMPH:  no palpable lymphadenopathy in the cervical, axillary or inguinal LUNGS: clear to auscultation and percussion with normal breathing effort HEART: regular rate & rhythm and no murmurs and no lower extremity edema ABDOMEN:abdomen soft, non-tender and normal bowel sounds Musculoskeletal:no cyanosis of digits and no clubbing  PSYCH: alert & oriented x 3 with fluent speech NEURO: no focal motor/sensory deficits  LABORATORY DATA:  I have reviewed the data as listed Lab Results  Component Value Date   WBC 7.4 04/10/2020   HGB 14.3 04/10/2020    HCT 46.0 04/10/2020   MCV 90.7 04/10/2020   PLT 102 (L) 04/10/2020   Peripheral blood smear is reviewed Large platelets are seen No evidence of platelet clumping Normal morphology of white blood cell and red blood cells  RADIOGRAPHIC STUDIES: I have personally reviewed the radiological images as listed and agreed with the findings in the report. DG Foot Complete Right  Result Date: 03/20/2020 Please see detailed radiograph report in office note.   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    Vitamin B12 deficiency Vitamin B12 level came back low This could cause ITP type picture/thrombocytopenia I recommend initiation of high-dose vitamin B12 supplementation with vitamin B12 injection once a week for 1 month and then monthly I plan to see her back in 3 months for further follow-up   Orders Placed This Encounter  Procedures  . CBC with Differential/Platelet    Standing Status:   Standing    Number of Occurrences:   22    Standing Expiration Date:   04/10/2021  . Vitamin B12    Standing Status:   Future    Number of Occurrences:   1    Standing Expiration Date:   04/10/2021  . Save Smear (SSMR)    Standing Status:   Future    Number of Occurrences:   1    Standing Expiration Date:   04/10/2021   All questions were answered. The patient knows to call the clinic with any problems, questions or concerns. No barriers to learning was detected. The total time spent in the appointment was 40  minutes encounter with patients including review of chart and various tests results, discussions about plan of care and coordination of care plan  [name], MD 04/10/2020 2:51 PM

## 2020-04-10 NOTE — Assessment & Plan Note (Signed)
Vitamin B12 level came back low This could cause ITP type picture/thrombocytopenia I recommend initiation of high-dose vitamin B12 supplementation with vitamin B12 injection once a week for 1 month and then monthly I plan to see her back in 3 months for further follow-up

## 2020-04-15 ENCOUNTER — Telehealth: Payer: Self-pay | Admitting: Hematology and Oncology

## 2020-04-15 NOTE — Telephone Encounter (Signed)
Scheduled appts per 6/23 and 6/24 sch msg. Pt confirmed appt dates and times.

## 2020-04-18 ENCOUNTER — Other Ambulatory Visit: Payer: Self-pay

## 2020-04-18 ENCOUNTER — Inpatient Hospital Stay: Payer: Medicare Other | Attending: Hematology and Oncology

## 2020-04-18 DIAGNOSIS — E538 Deficiency of other specified B group vitamins: Secondary | ICD-10-CM | POA: Insufficient documentation

## 2020-04-18 MED ORDER — CYANOCOBALAMIN 1000 MCG/ML IJ SOLN
1000.0000 ug | Freq: Once | INTRAMUSCULAR | Status: AC
  Administered 2020-04-18: 1000 ug via INTRAMUSCULAR

## 2020-04-25 ENCOUNTER — Inpatient Hospital Stay: Payer: Medicare Other

## 2020-04-25 ENCOUNTER — Other Ambulatory Visit: Payer: Self-pay

## 2020-04-25 VITALS — BP 137/57 | HR 70 | Temp 98.4°F | Resp 18

## 2020-04-25 DIAGNOSIS — E538 Deficiency of other specified B group vitamins: Secondary | ICD-10-CM

## 2020-04-25 MED ORDER — CYANOCOBALAMIN 1000 MCG/ML IJ SOLN
1000.0000 ug | Freq: Once | INTRAMUSCULAR | Status: AC
  Administered 2020-04-25: 1000 ug via INTRAMUSCULAR

## 2020-04-25 MED ORDER — CYANOCOBALAMIN 1000 MCG/ML IJ SOLN
INTRAMUSCULAR | Status: AC
  Filled 2020-04-25: qty 1

## 2020-05-02 ENCOUNTER — Other Ambulatory Visit: Payer: Self-pay

## 2020-05-02 ENCOUNTER — Inpatient Hospital Stay: Payer: Medicare Other

## 2020-05-02 VITALS — BP 136/65 | HR 58 | Resp 18

## 2020-05-02 DIAGNOSIS — E538 Deficiency of other specified B group vitamins: Secondary | ICD-10-CM

## 2020-05-02 MED ORDER — CYANOCOBALAMIN 1000 MCG/ML IJ SOLN
1000.0000 ug | Freq: Once | INTRAMUSCULAR | Status: AC
  Administered 2020-05-02: 1000 ug via INTRAMUSCULAR

## 2020-05-02 NOTE — Patient Instructions (Signed)

## 2020-05-08 ENCOUNTER — Ambulatory Visit (INDEPENDENT_AMBULATORY_CARE_PROVIDER_SITE_OTHER): Payer: Medicare Other | Admitting: Pulmonary Disease

## 2020-05-08 ENCOUNTER — Ambulatory Visit (INDEPENDENT_AMBULATORY_CARE_PROVIDER_SITE_OTHER): Payer: Medicare Other

## 2020-05-08 ENCOUNTER — Other Ambulatory Visit: Payer: Self-pay

## 2020-05-08 ENCOUNTER — Encounter: Payer: Self-pay | Admitting: Pulmonary Disease

## 2020-05-08 VITALS — BP 116/66 | HR 67 | Temp 98.5°F | Ht 65.0 in | Wt 173.0 lb

## 2020-05-08 DIAGNOSIS — R0602 Shortness of breath: Secondary | ICD-10-CM

## 2020-05-08 LAB — CBC WITH DIFFERENTIAL/PLATELET
Basophils Absolute: 0 10*3/uL (ref 0.0–0.1)
Basophils Relative: 0.4 % (ref 0.0–3.0)
Eosinophils Absolute: 0 10*3/uL (ref 0.0–0.7)
Eosinophils Relative: 0.4 % (ref 0.0–5.0)
HCT: 46.6 % — ABNORMAL HIGH (ref 36.0–46.0)
Hemoglobin: 15.1 g/dL — ABNORMAL HIGH (ref 12.0–15.0)
Lymphocytes Relative: 21.9 % (ref 12.0–46.0)
Lymphs Abs: 2.2 10*3/uL (ref 0.7–4.0)
MCHC: 32.5 g/dL (ref 30.0–36.0)
MCV: 87 fl (ref 78.0–100.0)
Monocytes Absolute: 0.7 10*3/uL (ref 0.1–1.0)
Monocytes Relative: 7.2 % (ref 3.0–12.0)
Neutro Abs: 7.1 10*3/uL (ref 1.4–7.7)
Neutrophils Relative %: 70.1 % (ref 43.0–77.0)
Platelets: 77 10*3/uL — ABNORMAL LOW (ref 150.0–400.0)
RBC: 5.36 Mil/uL — ABNORMAL HIGH (ref 3.87–5.11)
RDW: 15 % (ref 11.5–15.5)
WBC: 10.1 10*3/uL (ref 4.0–10.5)

## 2020-05-08 NOTE — Patient Instructions (Signed)
Check CBC with differential, IgE Schedule pulmonary function test Chest x-ray today Follow-up in clinic in 3 months for review.

## 2020-05-08 NOTE — Progress Notes (Signed)
[name]    [id number]    05-May-1941  Primary Care Physician:Lamb, Reola Mosher, MD  Referring Physician: Karl Ito, DO [address] [place],  [place] [address]  Chief complaint: Consult for COPD  HPI: 79 year old former smoker.  Previously followed in pulmonary clinic in 2013 for COPD. Maintained on Symbicort, albuterol inhaler Complains of worsening dyspnea over the past few years.  Symptoms increased with heat, humidity.  She is here for recheck.  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  No mold, hot tub, Jacuzzi Smoking history: 44-pack-year smoker.  Quit and 2005 Travel history: No significant travel history Relevant family history: No significant family history of lung disease  Outpatient Encounter Medications as of 05/08/2020  Medication Sig  . albuterol (PROVENTIL HFA;VENTOLIN HFA) 108 (90 BASE) MCG/ACT inhaler 1-2 puffs every 6 (six) hours as needed (wheezing). 1-2 puffs every 4-6 hours as needed  . albuterol (PROVENTIL) (5 MG/ML) 0.5% nebulizer solution Take 2.5 mg by nebulization every 2 (two) hours as needed for wheezing or shortness of breath.  . allopurinol (ZYLOPRIM) 100 MG tablet Take 100 mg by mouth daily.   [name] aspirin 81 MG tablet Take 81 mg by mouth daily.   [name] BIDIL 20-37.5 MG tablet TAKE 1 TABLET BY MOUTH THREE TIMES DAILY  . budesonide-formoterol (SYMBICORT) 160-4.5 MCG/ACT inhaler Inhale 2 puffs into the lungs 2 (two) times daily.  . carvedilol (COREG) 6.25 MG tablet TAKE 1 TABLET(6.25 MG) BY MOUTH TWICE DAILY WITH A MEAL  . colchicine 0.6 MG tablet Take 0.6 mg by mouth 2 (two) times daily as needed. For gout  . ENTRESTO 24-26 MG TAKE 1 TABLET BY MOUTH TWICE DAILY. DISCONTINUE LISINOPRIL MEDICATION  . famotidine (PEPCID) 20 MG tablet Take 1 tablet (20 mg total) by mouth 2 (two) times daily. Take one tablet twice daily for two days  . furosemide (LASIX) 40 MG tablet TAKE 1/2  TABLET(20 MG) BY MOUTH DAILY  . ipratropium (ATROVENT) 0.02 % nebulizer solution Take 0.5 mg by nebulization 4 (four) times daily.  . Multiple Vitamin (MULTIVITAMIN) tablet Take 1 tablet by mouth daily.   . nitroGLYCERIN (NITROSTAT) 0.4 MG SL tablet PLACE 1 TABLET UNDER TONGUE EVERY 5 MINUTES AS NEEDED FOR CHEST PAIN  . potassium chloride SA (K-DUR) 20 MEQ tablet Take 20 mEq by mouth daily.  . rosuvastatin (CRESTOR) 40 MG tablet TAKE 1 TABLET(40 MG) BY MOUTH DAILY  . spironolactone (ALDACTONE) 25 MG tablet TAKE 1 TABLET(25 MG) BY MOUTH DAILY  . tiotropium (SPIRIVA) 18 MCG inhalation capsule Place 18 mcg into inhaler and inhale daily.  . traMADol (ULTRAM) 50 MG tablet Take 50 mg by mouth every 6 (six) hours as needed.   No facility-administered encounter medications on file as of 05/08/2020.    Allergies as of 05/08/2020  . (No Known Allergies)    Past Medical History:  Diagnosis Date  . CAD (coronary artery disease)    s/p NSTEMI 2009 - s/p CABG 2009: L-LAD, S-OM1 and OM2, S-Dx, S-PDA.;  Myoview 11/04/11:  Small inf scar, mod ant apical and septal scar, no ischemia, EF 31%.// Myoview 10/2018:  EF 46, ant-sept and apical infarct; no ischemia, apical AK; Low Risk   . Cardiomyopathy, ischemic    a.  LVEF 35-40% // b. Echo 5/14: Moderate LVH, EF 30-35%, mid to distal ant-septal, apical HK // c. Echo  9/14: Mild focal basal septal hypertrophy,  EF 35-40%, anteroseptal and apical HK, grade 2 diastolic dysfunction, mild MR, reduced RV systolic function, moderate TR, PASP 47.      [name] Chronic systolic heart failure (HCC)   . HIT (heparin-induced thrombocytopenia) (HCC)   . HLD (hyperlipidemia)   . HTN (hypertension)   . Other emphysema (HCC)     Past Surgical History:  Procedure Laterality Date  . CORONARY ARTERY BYPASS GRAFT  November 21, 2007   5 vessel    Family History  Problem Relation Age of Onset  . Asthma Mother   . Hypertension Mother   . Diabetes Mother   . Rheum arthritis Mother      Social History   Socioeconomic History  . Marital status: Legally Separated    Spouse name: Not on file  . Number of children: Not on file  . Years of education: Not on file  . Highest education level: Not on file  Occupational History  . Occupation: does not currently work  Tobacco Use  . Smoking status: Former Smoker    Packs/day: 1.00    Years: 44.00    Pack years: 44.00    Types: Cigarettes    Quit date: 10/20/2007    Years since quitting: 12.5  . Smokeless tobacco: Never Used  . Tobacco comment: started at age 28s.   Vaping Use  . Vaping Use: Never used  Substance and Sexual Activity  . Alcohol use: No  . Drug use: No  . Sexual activity: Not on file  Other Topics Concern  . Not on file  Social History Narrative   Pt lives alone in [place].    Children live nearby   Social Determinants of Health   Financial Resource Strain:   . Difficulty of Paying Living Expenses:   Food Insecurity:   . Worried About Programme researcher, broadcasting/film/video in the Last Year:   . Barista in the Last Year:   Transportation Needs:   . Freight forwarder (Medical):   [name] Lack of Transportation (Non-Medical):   Physical Activity:   . Days of Exercise per Week:   . Minutes of Exercise per Session:   Stress:   . Feeling of Stress :   Social Connections:   . Frequency of Communication with Friends and Family:   . Frequency of Social Gatherings with Friends and Family:   . Attends Religious Services:   . Active Member of Clubs or Organizations:   . Attends Banker Meetings:   [name] Marital Status:   Intimate Partner Violence:   . Fear of Current or Ex-Partner:   . Emotionally Abused:   [name] Physically Abused:   . Sexually Abused:     Review of systems: Review of Systems  Constitutional: Negative for fever and chills.  HENT: Negative.   Eyes: Negative for blurred vision.  Respiratory: as per HPI  Cardiovascular: Negative for chest pain and palpitations.    Gastrointestinal: Negative for vomiting, diarrhea, blood per rectum. Genitourinary: Negative for dysuria, urgency, frequency and hematuria.  Musculoskeletal: Negative for myalgias, back pain and joint pain.  Skin: Negative for itching and rash.  Neurological: Negative for dizziness, tremors, focal weakness, seizures and loss of consciousness.  Endo/Heme/Allergies: Negative for environmental allergies.  Psychiatric/Behavioral: Negative for depression, suicidal ideas and hallucinations.  All other systems reviewed and are negative.  Physical Exam: Blood pressure 116/66, pulse 67, temperature 98.5 F (36.9 C), temperature source Oral, height 5\' 5"  (1.651 m), weight 173 lb (78.5 kg), SpO2 97 %.  Gen:      No acute distress HEENT:  EOMI, sclera anicteric Neck:     No masses; no thyromegaly Lungs:    Clear to auscultation bilaterally; normal respiratory effort CV:         Regular rate and rhythm; no murmurs Abd:      + bowel sounds; soft, non-tender; no palpable masses, no distension Ext:    No edema; adequate peripheral perfusion Skin:      Warm and dry; no rash Neuro: alert and oriented x 3 Psych: normal mood and affect  Data Reviewed: Imaging:  PFTs:  Labs:  Assessment:  COPD Continue current inhalers Check CBC differential, IgE, chest x-ray PFTs for baseline evaluation  Plan/Recommendations: CBC, IgE, chest x-ray, PFTs  Chilton Greathouse MD [facility] 05/08/2020, 10:41 AM  CC: Karl Ito, DO

## 2020-05-09 ENCOUNTER — Other Ambulatory Visit: Payer: Self-pay

## 2020-05-09 ENCOUNTER — Inpatient Hospital Stay: Payer: Medicare Other

## 2020-05-09 VITALS — BP 130/71 | HR 62 | Resp 18

## 2020-05-09 DIAGNOSIS — E538 Deficiency of other specified B group vitamins: Secondary | ICD-10-CM

## 2020-05-09 LAB — IGE: IgE (Immunoglobulin E), Serum: 38 kU/L (ref <=114)

## 2020-05-09 MED ORDER — CYANOCOBALAMIN 1000 MCG/ML IJ SOLN
1000.0000 ug | Freq: Once | INTRAMUSCULAR | Status: AC
  Administered 2020-05-09: 1000 ug via INTRAMUSCULAR

## 2020-05-09 MED ORDER — CYANOCOBALAMIN 1000 MCG/ML IJ SOLN
INTRAMUSCULAR | Status: AC
  Filled 2020-05-09: qty 1

## 2020-05-09 NOTE — Patient Instructions (Signed)

## 2020-05-11 ENCOUNTER — Encounter: Payer: Self-pay | Admitting: Pulmonary Disease

## 2020-05-16 ENCOUNTER — Inpatient Hospital Stay: Payer: Medicare Other

## 2020-05-16 ENCOUNTER — Other Ambulatory Visit: Payer: Self-pay

## 2020-05-16 VITALS — BP 145/64 | HR 68 | Temp 98.8°F | Resp 18

## 2020-05-16 DIAGNOSIS — E538 Deficiency of other specified B group vitamins: Secondary | ICD-10-CM

## 2020-05-16 MED ORDER — CYANOCOBALAMIN 1000 MCG/ML IJ SOLN
INTRAMUSCULAR | Status: AC
  Filled 2020-05-16: qty 1

## 2020-05-16 MED ORDER — CYANOCOBALAMIN 1000 MCG/ML IJ SOLN
1000.0000 ug | Freq: Once | INTRAMUSCULAR | Status: AC
  Administered 2020-05-16: 1000 ug via INTRAMUSCULAR

## 2020-05-16 NOTE — Patient Instructions (Signed)

## 2020-05-20 NOTE — Progress Notes (Signed)
Called and spoke with patient about xray and lab results. All questions answered. Patient expressed understanding. Nothing further at this time.

## 2020-05-26 ENCOUNTER — Other Ambulatory Visit: Payer: Self-pay | Admitting: Physician Assistant

## 2020-05-29 ENCOUNTER — Encounter: Payer: Self-pay | Admitting: Cardiovascular Disease

## 2020-05-29 ENCOUNTER — Other Ambulatory Visit: Payer: Self-pay

## 2020-05-29 ENCOUNTER — Ambulatory Visit (INDEPENDENT_AMBULATORY_CARE_PROVIDER_SITE_OTHER): Payer: Medicare Other | Admitting: Cardiovascular Disease

## 2020-05-29 VITALS — BP 132/70 | HR 78 | Ht 65.0 in | Wt 175.8 lb

## 2020-05-29 DIAGNOSIS — I2510 Atherosclerotic heart disease of native coronary artery without angina pectoris: Secondary | ICD-10-CM | POA: Diagnosis not present

## 2020-05-29 DIAGNOSIS — I5022 Chronic systolic (congestive) heart failure: Secondary | ICD-10-CM

## 2020-05-29 DIAGNOSIS — I10 Essential (primary) hypertension: Secondary | ICD-10-CM

## 2020-05-29 NOTE — Progress Notes (Signed)
Cardiology Office Note:    Date:  05/29/2020   ID:  [name], [name] 04-15-1941, MRN [id number]  PCP:  [name], MD  [facility] Cardiologist:  Tonny Bollman, MD  [facility] Electrophysiologist:  None   Referring MD: [name], MD   Chief Complaint  Patient presents with   Coronary Artery Disease    History of Present Illness:    [name] is a 79 y.o. female with a hx of:  Coronary artery disease ? S/p NSTEMI >>CABG in 2009 ? Myoview 10/2018: ant-sept, apical infarct, no ischemia  Chronic systolic CHF ? Ischemic cardiomyopathy ? Eval by Dr. Graciela Husbands in past for ?ICD; Meds titrated >>Echo 9/14: EF 35-40; no ICD ? 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 She had an injury to her right foot and has been in a soft brace since June. She complains of chronic knee pain from arthritis. From a cardiac perspective, she reports no change in symptoms. She denies chest pain or palpitations. No orthopnea, leg swelling, or PND. Mild exertional dyspnea is unchanged over time.   Past Medical History:  Diagnosis Date   CAD (coronary artery disease)    s/p NSTEMI 2009 - s/p CABG 2009: L-LAD, S-OM1 and OM2, S-Dx, S-PDA.;  Myoview 11/04/11:  Small inf scar, mod ant apical and septal scar, no ischemia, EF 31%.// Myoview 10/2018:  EF 46, ant-sept and apical infarct; no ischemia, apical AK; Low Risk    Cardiomyopathy, ischemic    a.  LVEF 35-40% // b. Echo 5/14: Moderate LVH, EF 30-35%, mid to distal ant-septal, apical HK // c. Echo  9/14: Mild focal basal septal hypertrophy, EF 35-40%, anteroseptal and apical HK, grade 2 diastolic dysfunction, mild MR, reduced RV systolic function, moderate TR, PASP 47.       Chronic systolic heart failure (HCC)    HIT (heparin-induced thrombocytopenia) (HCC)    HLD (hyperlipidemia)    HTN  (hypertension)    Other emphysema (HCC)     Past Surgical History:  Procedure Laterality Date   CORONARY ARTERY BYPASS GRAFT  November 21, 2007   5 vessel    Current Medications: Current Meds  Medication Sig   albuterol (PROVENTIL HFA;VENTOLIN HFA) 108 (90 BASE) MCG/ACT inhaler 1-2 puffs every 6 (six) hours as needed (wheezing). 1-2 puffs every 4-6 hours as needed   albuterol (PROVENTIL) (5 MG/ML) 0.5% nebulizer solution Take 2.5 mg by nebulization every 2 (two) hours as needed for wheezing or shortness of breath.   allopurinol (ZYLOPRIM) 100 MG tablet Take 100 mg by mouth daily.    aspirin 81 MG tablet Take 81 mg by mouth daily.    BIDIL 20-37.5 MG tablet TAKE 1 TABLET BY MOUTH THREE TIMES DAILY   budesonide-formoterol (SYMBICORT) 160-4.5 MCG/ACT inhaler Inhale 2 puffs into the lungs 2 (two) times daily.   carvedilol (COREG) 6.25 MG tablet TAKE 1 TABLET(6.25 MG) BY MOUTH TWICE DAILY WITH A MEAL   colchicine 0.6 MG tablet Take 0.6 mg by mouth 2 (two) times daily as needed. For gout   famotidine (PEPCID) 20 MG tablet Take 1 tablet (20 mg total) by mouth 2 (two) times daily. Take one tablet twice daily for two days   furosemide (LASIX) 40 MG tablet TAKE 1/2 TABLET(20 MG) BY MOUTH DAILY   ipratropium (ATROVENT) 0.02 % nebulizer solution Take  0.5 mg by nebulization 4 (four) times daily.   Multiple Vitamin (MULTIVITAMIN) tablet Take 1 tablet by mouth daily.    nitroGLYCERIN (NITROSTAT) 0.4 MG SL tablet PLACE 1 TABLET UNDER TONGUE EVERY 5 MINUTES AS NEEDED FOR CHEST PAIN   potassium chloride SA (K-DUR) 20 MEQ tablet Take 20 mEq by mouth daily.   rosuvastatin (CRESTOR) 40 MG tablet TAKE 1 TABLET(40 MG) BY MOUTH DAILY   sacubitril-valsartan (ENTRESTO) 24-26 MG Take 1 tablet by mouth 2 (two) times daily.   spironolactone (ALDACTONE) 25 MG tablet TAKE 1 TABLET(25 MG) BY MOUTH DAILY   tiotropium (SPIRIVA) 18 MCG inhalation capsule Place 18 mcg into inhaler and inhale daily.    traMADol (ULTRAM) 50 MG tablet Take 50 mg by mouth every 6 (six) hours as needed.     Allergies:   Patient has no known allergies.   Social History   Socioeconomic History   Marital status: Legally Separated    Spouse name: Not on file   Number of children: Not on file   Years of education: Not on file   Highest education level: Not on file  Occupational History   Occupation: does not currently work  Tobacco Use   Smoking status: Former Smoker    Packs/day: 1.00    Years: 44.00    Pack years: 44.00    Types: Cigarettes    Quit date: 10/20/2007    Years since quitting: 12.6   Smokeless tobacco: Never Used   Tobacco comment: started at age [age]s.   Vaping Use   Vaping Use: Never used  Substance and Sexual Activity   Alcohol use: No   Drug use: No   Sexual activity: Not on file  Other Topics Concern   Not on file  Social History Narrative   [place] lives alone in [place].    Children live nearby   Social Determinants of Health   Financial Resource Strain:    Difficulty of Paying Living Expenses:   Food Insecurity:    Worried About Programme researcher, broadcasting/film/videounning Out of Food in the Last Year:    Baristaan Out of Food in the Last Year:   Transportation Needs:    Freight forwarderLack of Transportation (Medical):    Lack of Transportation (Non-Medical):   Physical Activity:    Days of Exercise per Week:    Minutes of Exercise per Session:   Stress:    Feeling of Stress :   Social Connections:    Frequency of Communication with Friends and Family:    Frequency of Social Gatherings with Friends and Family:    Attends Religious Services:    Active Member of Clubs or Organizations:    Attends Engineer, structuralClub or Organization Meetings:    Marital Status:      Family History: The patient's family history includes Asthma in her mother; Diabetes in her mother; Hypertension in her mother; Rheum arthritis in her mother.  ROS:   Please see the history of present illness.    All other systems reviewed and  are negative.  EKGs/Labs/Other Studies Reviewed:    EKG:  EKG is ordered today.  The ekg ordered today demonstrates normal sinus rhythm with left anterior fascicular block, occasional PVC  Recent Labs: 06/14/2019: ALT 16; BUN 18; Creatinine, Ser 1.32; Potassium 4.4; Sodium 141 05/08/2020: Hemoglobin 15.1; Platelets 77.0  Recent Lipid Panel    Component Value Date/Time   CHOL 100 06/14/2019 1250   TRIG 125 06/14/2019 1250   HDL 34 (L) 06/14/2019 1250   CHOLHDL 2.9 06/14/2019  1250   CHOLHDL 3 01/29/2014 1137   VLDL 21.2 01/29/2014 1137   LDLCALC 41 06/14/2019 1250    Physical Exam:    VS:  BP 132/70    Pulse 78    Ht 5\' 5"  (1.651 m)    Wt 175 lb 12.8 oz (79.7 kg)    BMI 29.25 kg/m     Wt Readings from Last 3 Encounters:  05/29/20 175 lb 12.8 oz (79.7 kg)  05/08/20 173 lb (78.5 kg)  04/10/20 176 lb 6.4 oz (80 kg)     GEN: Elderly woman in no acute distress HEENT: Normal NECK: No JVD; No carotid bruits LYMPHATICS: No lymphadenopathy CARDIAC: RRR, no murmurs, rubs, gallops RESPIRATORY:  Clear to auscultation without rales, wheezing or rhonchi  ABDOMEN: Soft, non-tender, non-distended MUSCULOSKELETAL:  No edema; No deformity  SKIN: Warm and dry NEUROLOGIC:  Alert and oriented x 3 PSYCHIATRIC:  Normal affect   ASSESSMENT:    1. Chronic systolic heart failure (HCC)   2. Coronary artery disease involving native coronary artery of native heart without angina pectoris   3. Essential hypertension    PLAN:    In order of problems listed above:  1. The patient appears stable with New York Heart Association functional class IIb symptoms.  It has been many years since her last echocardiogram.  I have recommended an updated study as her last echo was in 2014 with an LVEF of 35 to 40%.  She does appear to have stable symptoms on a combination of carvedilol, furosemide, BiDil, Entresto, and spironolactone. 2. No anginal symptoms.  Continue aspirin high intensity statin drug  (rosuvastatin 40 mg).  Okay to continue antiplatelet therapy with aspirin as long as her platelet count remains greater than 50,000 and there are no clinical bleeding issues. 3. Blood pressure well controlled on multidrug therapy.  The patient would like to reduce her medications.  Will await review of her echocardiogram before making any changes.  She seems to be stable on her current regimen and I would be inclined to continue this unless there is some significant change on her echocardiogram.   Medication Adjustments/Labs and Tests Ordered: Current medicines are reviewed at length with the patient today.  Concerns regarding medicines are outlined above.  Orders Placed This Encounter  Procedures   ECHOCARDIOGRAM COMPLETE   No orders of the defined types were placed in this encounter.   Patient Instructions  Medication Instructions:  Your provider recommends that you continue on your current medications as directed. Please refer to the Current Medication list given to you today.   *If you need a refill on your cardiac medications before your next appointment, please call your pharmacy*  Testing/Procedures: Your provider has requested that you have an echocardiogram. Echocardiography is a painless test that uses sound waves to create images of your heart. It provides your doctor with information about the size and shape of your heart and how well your hearts chambers and valves are working. This procedure takes approximately one hour. There are no restrictions for this procedure.      Follow-Up: At [facility], you and your health needs are our priority.  As part of our continuing mission to provide you with exceptional heart care, we have created designated Provider Care Teams.  These Care Teams include your primary Cardiologist (physician) and Advanced Practice Providers (APPs -  Physician Assistants and Nurse Practitioners) who all work together to provide you with the care you need,  when you need it. Your  next appointment:   12 month(s) The format for your next appointment:   In Person Provider:   You may see Tonny Bollman, MD or one of the following Advanced Practice Providers on your designated Care Team:    Tereso Newcomer, PA-C  Chelsea Aus, [place]      Signed, Tonny Bollman, MD  05/29/2020 3:42 PM    [facility]

## 2020-05-29 NOTE — Patient Instructions (Signed)
Medication Instructions:  Your provider recommends that you continue on your current medications as directed. Please refer to the Current Medication list given to you today.   *If you need a refill on your cardiac medications before your next appointment, please call your pharmacy*  Testing/Procedures: Your provider has requested that you have an echocardiogram. Echocardiography is a painless test that uses sound waves to create images of your heart. It provides your doctor with information about the size and shape of your heart and how well your heart's chambers and valves are working. This procedure takes approximately one hour. There are no restrictions for this procedure.      Follow-Up: At [facility], you and your health needs are our priority.  As part of our continuing mission to provide you with exceptional heart care, we have created designated Provider Care Teams.  These Care Teams include your primary Cardiologist (physician) and Advanced Practice Providers (APPs -  Physician Assistants and Nurse Practitioners) who all work together to provide you with the care you need, when you need it. Your next appointment:   12 month(s) The format for your next appointment:   In Person Provider:   You may see Michael Cooper, MD or one of the following Advanced Practice Providers on your designated Care Team:    Scott Weaver, PA-C  Vin Bhagat, PA-C   

## 2020-05-31 NOTE — Addendum Note (Signed)
Addended by: Madalyn Rob A on: 05/31/2020 05:11 PM   Modules accepted: Orders

## 2020-06-11 ENCOUNTER — Other Ambulatory Visit: Payer: Self-pay

## 2020-06-11 ENCOUNTER — Ambulatory Visit (HOSPITAL_COMMUNITY): Payer: Medicare Other | Attending: Cardiology

## 2020-06-11 DIAGNOSIS — I5022 Chronic systolic (congestive) heart failure: Secondary | ICD-10-CM | POA: Insufficient documentation

## 2020-06-11 LAB — ECHOCARDIOGRAM COMPLETE
Area-P 1/2: 5.97 cm2
S' Lateral: 3.6 cm

## 2020-06-11 MED ORDER — PERFLUTREN LIPID MICROSPHERE
1.0000 mL | INTRAVENOUS | Status: AC | PRN
  Administered 2020-06-11: 1 mL via INTRAVENOUS

## 2020-06-17 ENCOUNTER — Inpatient Hospital Stay: Payer: Medicare Other | Attending: Hematology and Oncology

## 2020-06-17 ENCOUNTER — Other Ambulatory Visit: Payer: Self-pay

## 2020-06-17 VITALS — BP 138/69 | HR 73 | Resp 18

## 2020-06-17 DIAGNOSIS — E538 Deficiency of other specified B group vitamins: Secondary | ICD-10-CM | POA: Diagnosis not present

## 2020-06-17 MED ORDER — CYANOCOBALAMIN 1000 MCG/ML IJ SOLN
1000.0000 ug | Freq: Once | INTRAMUSCULAR | Status: AC
  Administered 2020-06-17: 1000 ug via INTRAMUSCULAR

## 2020-06-17 NOTE — Patient Instructions (Signed)

## 2020-06-25 ENCOUNTER — Other Ambulatory Visit: Payer: Self-pay | Admitting: Physician Assistant

## 2020-06-25 ENCOUNTER — Other Ambulatory Visit: Payer: Self-pay | Admitting: Cardiovascular Disease

## 2020-06-25 DIAGNOSIS — R0602 Shortness of breath: Secondary | ICD-10-CM

## 2020-06-25 DIAGNOSIS — I5022 Chronic systolic (congestive) heart failure: Secondary | ICD-10-CM

## 2020-06-26 ENCOUNTER — Encounter: Payer: Self-pay | Admitting: Podiatry

## 2020-06-26 ENCOUNTER — Ambulatory Visit (INDEPENDENT_AMBULATORY_CARE_PROVIDER_SITE_OTHER): Payer: Medicare Other | Admitting: Podiatry

## 2020-06-26 ENCOUNTER — Other Ambulatory Visit: Payer: Self-pay

## 2020-06-26 DIAGNOSIS — B351 Tinea unguium: Secondary | ICD-10-CM | POA: Diagnosis not present

## 2020-06-26 DIAGNOSIS — M79674 Pain in right toe(s): Secondary | ICD-10-CM

## 2020-06-26 DIAGNOSIS — M79675 Pain in left toe(s): Secondary | ICD-10-CM

## 2020-06-26 DIAGNOSIS — S92334D Nondisplaced fracture of third metatarsal bone, right foot, subsequent encounter for fracture with routine healing: Secondary | ICD-10-CM

## 2020-06-26 DIAGNOSIS — M79671 Pain in right foot: Secondary | ICD-10-CM

## 2020-06-26 NOTE — Progress Notes (Signed)
This patient returns to the office for evaluation and treatment of long thick painful nails .  This patient is unable to trim her own nails since the patient cannot reach hierfeet.  Patient says the nails are painful walking and wearing her shoes.  She returns for preventive foot care services.  Patient says her right foot continues to be occasionally painful.  She had injured her foot and after my evaluating her foot dispensed a darco shoe for closed fracture.  She is not interested in treatment today.  General Appearance  Alert, conversant and in no acute stress.  Vascular  Dorsalis pedis and posterior tibial  pulses are palpable  bilaterally.  Capillary return is within normal limits  bilaterally. Temperature is within normal limits  bilaterally.  Neurologic  Senn-Weinstein monofilament wire test within normal limits  bilaterally. Muscle power within normal limits bilaterally.  Nails Thick disfigured discolored nails with subungual debris  from hallux to fifth toes bilaterally. No evidence of bacterial infection or drainage bilaterally.  Orthopedic  No limitations of motion  feet .  No crepitus or effusions noted.  No bony pathology or digital deformities noted. Palpable pain 3rd and 4th interspaces right foot.  No swelling or increased temperature noted right foot.  Skin  normotropic skin with no porokeratosis noted bilaterally.  No signs of infections or ulcers noted.     Onychomycosis  Pain in toes right foot  Pain in toes left foot  Right foot pain following foot injiury.  Debridement  of nails  1-5  B/L with a nail nipper.  Nails were then filed using a dremel tool with no incidents.  Discussed her foot pain.  She only wanted an anklet today.  Told her that if problem persists she will need to make a follow up appointment with  Medical podiatrists for further evaluation.  Her pain could have caused neuroma like pain 3/4 interspaces right foot due to compensating during gait.  RTC  3  months.   Helane Gunther DPM

## 2020-07-18 ENCOUNTER — Other Ambulatory Visit: Payer: Self-pay

## 2020-07-18 ENCOUNTER — Inpatient Hospital Stay: Payer: Medicare Other | Attending: Hematology and Oncology

## 2020-07-18 VITALS — BP 154/85 | HR 66 | Resp 18

## 2020-07-18 DIAGNOSIS — E538 Deficiency of other specified B group vitamins: Secondary | ICD-10-CM | POA: Insufficient documentation

## 2020-07-18 MED ORDER — CYANOCOBALAMIN 1000 MCG/ML IJ SOLN
1000.0000 ug | Freq: Once | INTRAMUSCULAR | Status: AC
  Administered 2020-07-18: 1000 ug via INTRAMUSCULAR

## 2020-08-16 ENCOUNTER — Inpatient Hospital Stay: Payer: Medicare Other | Attending: Hematology and Oncology

## 2020-08-16 ENCOUNTER — Encounter: Payer: Self-pay | Admitting: Hematology and Oncology

## 2020-08-16 ENCOUNTER — Inpatient Hospital Stay: Payer: Medicare Other

## 2020-08-16 ENCOUNTER — Inpatient Hospital Stay (HOSPITAL_BASED_OUTPATIENT_CLINIC_OR_DEPARTMENT_OTHER): Payer: Medicare Other | Admitting: Hematology and Oncology

## 2020-08-16 ENCOUNTER — Other Ambulatory Visit: Payer: Self-pay

## 2020-08-16 DIAGNOSIS — D696 Thrombocytopenia, unspecified: Secondary | ICD-10-CM

## 2020-08-16 DIAGNOSIS — E538 Deficiency of other specified B group vitamins: Secondary | ICD-10-CM

## 2020-08-16 LAB — CBC WITH DIFFERENTIAL/PLATELET
Abs Immature Granulocytes: 0.02 10*3/uL (ref 0.00–0.07)
Basophils Absolute: 0 10*3/uL (ref 0.0–0.1)
Basophils Relative: 1 %
Eosinophils Absolute: 0.1 10*3/uL (ref 0.0–0.5)
Eosinophils Relative: 2 %
HCT: 44.5 % (ref 36.0–46.0)
Hemoglobin: 13.9 g/dL (ref 12.0–15.0)
Immature Granulocytes: 0 %
Lymphocytes Relative: 22 %
Lymphs Abs: 1.7 10*3/uL (ref 0.7–4.0)
MCH: 27.9 pg (ref 26.0–34.0)
MCHC: 31.2 g/dL (ref 30.0–36.0)
MCV: 89.4 fL (ref 80.0–100.0)
Monocytes Absolute: 0.8 10*3/uL (ref 0.1–1.0)
Monocytes Relative: 10 %
Neutro Abs: 5 10*3/uL (ref 1.7–7.7)
Neutrophils Relative %: 65 %
Platelets: 100 10*3/uL — ABNORMAL LOW (ref 150–400)
RBC: 4.98 MIL/uL (ref 3.87–5.11)
RDW: 13.5 % (ref 11.5–15.5)
WBC: 7.6 10*3/uL (ref 4.0–10.5)
nRBC: 0 % (ref 0.0–0.2)

## 2020-08-16 MED ORDER — CYANOCOBALAMIN 1000 MCG/ML IJ SOLN
1000.0000 ug | Freq: Once | INTRAMUSCULAR | Status: AC
  Administered 2020-08-16: 1000 ug via INTRAMUSCULAR

## 2020-08-16 NOTE — Assessment & Plan Note (Signed)
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

## 2020-08-16 NOTE — Assessment & Plan Note (Signed)
She tolerated B12 injection well She will continue monthly B12 injection for a while I plan to recheck vitamin B12 in her next visit

## 2020-08-16 NOTE — Progress Notes (Signed)
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     Orders Placed This Encounter  Procedures  . Vitamin B12    Standing Status:   Standing    Number of Occurrences:   2    Standing Expiration Date:   08/16/2021    The total time spent in the appointment was 15 minutes encounter with patients including review of chart and various tests results, discussions about plan of care and coordination of care plan   All questions were answered. The patient knows to call the clinic with any problems, questions or concerns. No barriers to learning was detected.    [name], MD 10/29/202111:56 AM  INTERVAL HISTORY: [name] 79 y.o. female returns for follow-up on B12 deficiency and chronic thrombocytopenia She feels well No recent infection, fever or chills The patient denies any recent signs or symptoms of bleeding such as spontaneous epistaxis, hematuria or hematochezia.  SUMMARY OF HEMATOLOGIC HISTORY:  She was found to have abnormal CBC from routine blood work I have the opportunity to review her CBC from electronic record dated back to December 2009 Her platelet count ranged between 63,000-163,000 On 03/14/2020, her platelet count came back at 100,000 The patient has history of myocardial infarction with several hospitalization for cardiac related issues over the years She denies recent bruising/bleeding, such as  spontaneous epistaxis, hematuria, melena or hematochezia  The patient denies history of liver disease She has been exposed to IV heparin in the past She denies prior blood or platelet transfusions In June 2021, she was found to have B12 deficiency.  She was started on B12 injections  I have reviewed the past medical history, past surgical history, social history and family history with the patient and they are unchanged from previous note.  ALLERGIES:  has No Known Allergies.  MEDICATIONS:  Current Outpatient Medications  Medication Sig Dispense Refill  . albuterol (PROVENTIL HFA;VENTOLIN HFA) 108 (90 BASE) MCG/ACT inhaler 1-2 puffs every 6 (six) hours as needed (wheezing). 1-2 puffs every 4-6 hours as needed    . albuterol (PROVENTIL) (5 MG/ML) 0.5% nebulizer solution Take 2.5 mg by nebulization every 2 (two) hours as needed for wheezing or shortness of breath.    . allopurinol (ZYLOPRIM) 100 MG tablet Take 100 mg by mouth daily.     [name] aspirin 81 MG tablet Take 81 mg by mouth daily.     [name] BIDIL 20-37.5 MG tablet TAKE 1 TABLET BY MOUTH THREE TIMES DAILY 270 tablet 3  . budesonide-formoterol (SYMBICORT) 160-4.5 MCG/ACT inhaler Inhale 2 puffs into the lungs 2 (two) times daily.    . carvedilol (COREG) 6.25 MG tablet TAKE 1 TABLET(6.25 MG) BY MOUTH TWICE DAILY WITH A MEAL 180 tablet 3  . colchicine 0.6 MG tablet Take 0.6 mg by mouth 2 (two) times daily as needed. For gout    . famotidine (PEPCID) 20 MG tablet Take 1 tablet (20 mg total) by mouth 2 (  two) times daily. Take one tablet twice daily for two days 10 tablet 0  . furosemide (LASIX) 40 MG tablet TAKE 1/2 TABLET(20 MG) BY MOUTH DAILY 45 tablet 2  . ipratropium (ATROVENT) 0.02 % nebulizer solution Take 0.5 mg by nebulization 4 (four) times daily.    . Multiple Vitamin (MULTIVITAMIN) tablet Take 1 tablet by mouth daily.     . nitroGLYCERIN (NITROSTAT) 0.4 MG SL tablet PLACE 1 TABLET UNDER TONGUE EVERY 5 MINUTES AS NEEDED FOR CHEST PAIN 75  tablet 0  . potassium chloride SA (K-DUR) 20 MEQ tablet Take 20 mEq by mouth daily.    . rosuvastatin (CRESTOR) 40 MG tablet TAKE 1 TABLET(40 MG) BY MOUTH DAILY 90 tablet 3  . sacubitril-valsartan (ENTRESTO) 24-26 MG Take 1 tablet by mouth 2 (two) times daily. 180 tablet 1  . spironolactone (ALDACTONE) 25 MG tablet TAKE 1 TABLET(25 MG) BY MOUTH DAILY 90 tablet 3  . tiotropium (SPIRIVA) 18 MCG inhalation capsule Place 18 mcg into inhaler and inhale daily.    . traMADol (ULTRAM) 50 MG tablet Take 50 mg by mouth every 6 (six) hours as needed.     No current facility-administered medications for this visit.     REVIEW OF SYSTEMS:   Constitutional: Denies fevers, chills or night sweats Eyes: Denies blurriness of vision Ears, nose, mouth, throat, and face: Denies mucositis or sore throat Respiratory: Denies cough, dyspnea or wheezes Cardiovascular: Denies palpitation, chest discomfort or lower extremity swelling Gastrointestinal:  Denies nausea, heartburn or change in bowel habits Skin: Denies abnormal skin rashes Lymphatics: Denies new lymphadenopathy or easy bruising Neurological:Denies numbness, tingling or new weaknesses Behavioral/Psych: Mood is stable, no new changes  All other systems were reviewed with the patient and are negative.  PHYSICAL EXAMINATION: ECOG PERFORMANCE STATUS: 0 - Asymptomatic  Vitals:   08/16/20 1102  BP: (!) 125/53  Pulse: 74  Resp: 18  Temp: 97.9 F (36.6 C)  SpO2: 96%   Filed Weights   08/16/20 1102  Weight: 173 lb 12.8 oz (78.8 kg)    GENERAL:alert, no distress and comfortable  NEURO: alert & oriented x 3 with fluent speech, no focal motor/sensory deficits  LABORATORY DATA:  I have reviewed the data as listed     Component Value Date/Time   NA 141 06/14/2019 1250   K 4.4 06/14/2019 1250   CL 99 06/14/2019 1250   CO2 25 06/14/2019 1250   GLUCOSE 121 (H) 06/14/2019 1250   GLUCOSE 110 (H) 07/13/2016 1306   BUN 18 06/14/2019 1250    CREATININE 1.32 (H) 06/14/2019 1250   CREATININE 1.12 (H) 07/13/2016 1306   CALCIUM 9.6 06/14/2019 1250   PROT 7.2 06/14/2019 1250   ALBUMIN 4.5 06/14/2019 1250   AST 20 06/14/2019 1250   ALT 16 06/14/2019 1250   ALKPHOS 80 06/14/2019 1250   BILITOT 0.8 06/14/2019 1250   GFRNONAA 38 (L) 06/14/2019 1250   GFRAA 44 (L) 06/14/2019 1250    No results found for: SPEP, UPEP  Lab Results  Component Value Date   WBC 7.6 08/16/2020   NEUTROABS 5.0 08/16/2020   HGB 13.9 08/16/2020   HCT 44.5 08/16/2020   MCV 89.4 08/16/2020   PLT 100 (L) 08/16/2020      Chemistry      Component Value Date/Time   NA 141 06/14/2019 1250   K 4.4 06/14/2019 1250   CL 99 06/14/2019 1250   CO2 25 06/14/2019 1250   BUN 18 06/14/2019 1250   CREATININE 1.32 (  H) 06/14/2019 1250   CREATININE 1.12 (H) 07/13/2016 1306      Component Value Date/Time   CALCIUM 9.6 06/14/2019 1250   ALKPHOS 80 06/14/2019 1250   AST 20 06/14/2019 1250   ALT 16 06/14/2019 1250   BILITOT 0.8 06/14/2019 1250

## 2020-09-16 ENCOUNTER — Inpatient Hospital Stay: Payer: Medicare Other | Attending: Hematology and Oncology

## 2020-09-16 ENCOUNTER — Other Ambulatory Visit: Payer: Self-pay

## 2020-09-16 VITALS — BP 127/61 | HR 68 | Temp 99.1°F | Resp 18

## 2020-09-16 DIAGNOSIS — E538 Deficiency of other specified B group vitamins: Secondary | ICD-10-CM | POA: Insufficient documentation

## 2020-09-16 MED ORDER — CYANOCOBALAMIN 1000 MCG/ML IJ SOLN
1000.0000 ug | Freq: Once | INTRAMUSCULAR | Status: AC
  Administered 2020-09-16: 1000 ug via INTRAMUSCULAR

## 2020-09-16 MED ORDER — CYANOCOBALAMIN 1000 MCG/ML IJ SOLN
INTRAMUSCULAR | Status: AC
  Filled 2020-09-16: qty 1

## 2020-09-16 NOTE — Patient Instructions (Signed)

## 2020-09-25 ENCOUNTER — Ambulatory Visit: Payer: Medicare Other | Admitting: Podiatry

## 2020-10-16 ENCOUNTER — Inpatient Hospital Stay: Payer: Medicare Other | Attending: Hematology and Oncology

## 2020-10-17 ENCOUNTER — Other Ambulatory Visit: Payer: Self-pay | Admitting: General Practice

## 2020-10-17 DIAGNOSIS — Z780 Asymptomatic menopausal state: Secondary | ICD-10-CM

## 2020-10-17 DIAGNOSIS — E2839 Other primary ovarian failure: Secondary | ICD-10-CM

## 2020-10-23 ENCOUNTER — Other Ambulatory Visit: Payer: Self-pay

## 2020-10-23 ENCOUNTER — Ambulatory Visit (INDEPENDENT_AMBULATORY_CARE_PROVIDER_SITE_OTHER): Payer: Medicare Other | Admitting: Podiatry

## 2020-10-23 ENCOUNTER — Encounter: Payer: Self-pay | Admitting: Podiatry

## 2020-10-23 DIAGNOSIS — M79674 Pain in right toe(s): Secondary | ICD-10-CM | POA: Diagnosis not present

## 2020-10-23 DIAGNOSIS — M79675 Pain in left toe(s): Secondary | ICD-10-CM

## 2020-10-23 DIAGNOSIS — B351 Tinea unguium: Secondary | ICD-10-CM | POA: Diagnosis not present

## 2020-10-23 NOTE — Progress Notes (Signed)
This patient returns to my office for at risk foot care.  This patient requires this care by a professional since this patient will be at risk due to having thrombocytopenia.   This patient is unable to cut nails herself since the patient cannot reach her nails.These nails are painful walking and wearing shoes.  This patient presents for at risk foot care today.  General Appearance  Alert, conversant and in no acute stress.  Vascular  Dorsalis pedis and posterior tibial  pulses are palpable  bilaterally.  Capillary return is within normal limits  bilaterally. Temperature is within normal limits  bilaterally.  Neurologic  Senn-Weinstein monofilament wire test within normal limits  bilaterally. Muscle power within normal limits bilaterally.  Nails Thick disfigured discolored nails with subungual debris  from hallux to fifth toes bilaterally. No evidence of bacterial infection or drainage bilaterally.  Orthopedic  No limitations of motion  feet .  No crepitus or effusions noted.  No bony pathology or digital deformities noted.  Skin  normotropic skin with no porokeratosis noted bilaterally.  No signs of infections or ulcers noted.     Onychomycosis  Pain in right toes  Pain in left toes  Consent was obtained for treatment procedures.   Mechanical debridement of nails 1-5  bilaterally performed with a nail nipper.  Filed with dremel without incident.    Return office visit   3 months                   Told patient to return for periodic foot care and evaluation due to potential at risk complications.   Rayleen Wyrick DPM  

## 2020-11-15 ENCOUNTER — Inpatient Hospital Stay: Payer: Medicare Other | Attending: Hematology and Oncology

## 2020-11-15 ENCOUNTER — Other Ambulatory Visit: Payer: Self-pay

## 2020-11-15 VITALS — BP 146/67 | HR 60 | Resp 18

## 2020-11-15 DIAGNOSIS — E538 Deficiency of other specified B group vitamins: Secondary | ICD-10-CM | POA: Insufficient documentation

## 2020-11-15 MED ORDER — CYANOCOBALAMIN 1000 MCG/ML IJ SOLN
1000.0000 ug | Freq: Once | INTRAMUSCULAR | Status: AC
  Administered 2020-11-15: 1000 ug via INTRAMUSCULAR

## 2020-11-15 MED ORDER — CYANOCOBALAMIN 1000 MCG/ML IJ SOLN
INTRAMUSCULAR | Status: AC
  Filled 2020-11-15: qty 1

## 2020-11-15 NOTE — Patient Instructions (Signed)

## 2020-11-22 ENCOUNTER — Other Ambulatory Visit: Payer: Self-pay | Admitting: Physician Assistant

## 2020-11-22 ENCOUNTER — Other Ambulatory Visit: Payer: Self-pay | Admitting: Cardiovascular Disease

## 2020-12-16 ENCOUNTER — Inpatient Hospital Stay: Payer: 59 | Attending: Hematology and Oncology

## 2020-12-16 ENCOUNTER — Other Ambulatory Visit: Payer: Self-pay

## 2020-12-16 VITALS — BP 149/97 | HR 84 | Resp 18

## 2020-12-16 DIAGNOSIS — E538 Deficiency of other specified B group vitamins: Secondary | ICD-10-CM | POA: Diagnosis not present

## 2020-12-16 MED ORDER — CYANOCOBALAMIN 1000 MCG/ML IJ SOLN
INTRAMUSCULAR | Status: AC
  Filled 2020-12-16: qty 1

## 2020-12-16 MED ORDER — CYANOCOBALAMIN 1000 MCG/ML IJ SOLN
1000.0000 ug | Freq: Once | INTRAMUSCULAR | Status: AC
  Administered 2020-12-16: 1000 ug via INTRAMUSCULAR

## 2020-12-22 ENCOUNTER — Other Ambulatory Visit: Payer: Self-pay | Admitting: Physician Assistant

## 2020-12-22 DIAGNOSIS — I5022 Chronic systolic (congestive) heart failure: Secondary | ICD-10-CM

## 2021-01-10 ENCOUNTER — Telehealth: Payer: Self-pay | Admitting: Hematology and Oncology

## 2021-01-10 NOTE — Telephone Encounter (Signed)
R/s 3/28 appt per patient request. Called and spoke with patient. Confirmed new date and time  

## 2021-01-13 ENCOUNTER — Inpatient Hospital Stay: Payer: 59

## 2021-01-20 ENCOUNTER — Other Ambulatory Visit: Payer: Self-pay

## 2021-01-20 ENCOUNTER — Inpatient Hospital Stay: Payer: 59 | Attending: Hematology and Oncology

## 2021-01-20 VITALS — BP 149/69 | HR 73 | Resp 18

## 2021-01-20 DIAGNOSIS — E538 Deficiency of other specified B group vitamins: Secondary | ICD-10-CM | POA: Diagnosis present

## 2021-01-20 MED ORDER — CYANOCOBALAMIN 1000 MCG/ML IJ SOLN
1000.0000 ug | Freq: Once | INTRAMUSCULAR | Status: AC
  Administered 2021-01-20: 1000 ug via INTRAMUSCULAR

## 2021-01-20 MED ORDER — CYANOCOBALAMIN 1000 MCG/ML IJ SOLN
INTRAMUSCULAR | Status: AC
  Filled 2021-01-20: qty 1

## 2021-01-20 NOTE — Patient Instructions (Signed)

## 2021-01-22 ENCOUNTER — Encounter: Payer: Self-pay | Admitting: Podiatry

## 2021-01-22 ENCOUNTER — Ambulatory Visit (INDEPENDENT_AMBULATORY_CARE_PROVIDER_SITE_OTHER): Payer: 59 | Admitting: Podiatry

## 2021-01-22 ENCOUNTER — Other Ambulatory Visit: Payer: Self-pay

## 2021-01-22 DIAGNOSIS — M79674 Pain in right toe(s): Secondary | ICD-10-CM

## 2021-01-22 DIAGNOSIS — M79675 Pain in left toe(s): Secondary | ICD-10-CM | POA: Diagnosis not present

## 2021-01-22 DIAGNOSIS — B351 Tinea unguium: Secondary | ICD-10-CM | POA: Diagnosis not present

## 2021-01-22 DIAGNOSIS — D696 Thrombocytopenia, unspecified: Secondary | ICD-10-CM | POA: Diagnosis not present

## 2021-01-22 NOTE — Progress Notes (Signed)
This patient returns to my office for at risk foot care.  This patient requires this care by a professional since this patient will be at risk due to having thrombocytopenia.   This patient is unable to cut nails herself since the patient cannot reach her nails.These nails are painful walking and wearing shoes.  This patient presents for at risk foot care today.  General Appearance  Alert, conversant and in no acute stress.  Vascular  Dorsalis pedis and posterior tibial  pulses are palpable  bilaterally.  Capillary return is within normal limits  bilaterally. Temperature is within normal limits  bilaterally.  Neurologic  Senn-Weinstein monofilament wire test within normal limits  bilaterally. Muscle power within normal limits bilaterally.  Nails Thick disfigured discolored nails with subungual debris  from hallux to fifth toes bilaterally. No evidence of bacterial infection or drainage bilaterally.  Orthopedic  No limitations of motion  feet .  No crepitus or effusions noted.  No bony pathology or digital deformities noted.  Skin  normotropic skin with no porokeratosis noted bilaterally.  No signs of infections or ulcers noted.     Onychomycosis  Pain in right toes  Pain in left toes  Consent was obtained for treatment procedures.   Mechanical debridement of nails 1-5  bilaterally performed with a nail nipper.  Filed with dremel without incident.    Return office visit   3 months                   Told patient to return for periodic foot care and evaluation due to potential at risk complications.   Helane Gunther DPM

## 2021-02-05 ENCOUNTER — Other Ambulatory Visit: Payer: Medicare Other

## 2021-02-11 ENCOUNTER — Telehealth: Payer: Self-pay | Admitting: Hematology and Oncology

## 2021-02-11 NOTE — Telephone Encounter (Signed)
Called pt to r/s appts per 4/26 sch msg. No answer. Left msg for pt to call back to r/s.

## 2021-02-13 ENCOUNTER — Inpatient Hospital Stay: Payer: 59 | Admitting: Hematology and Oncology

## 2021-02-13 ENCOUNTER — Inpatient Hospital Stay: Payer: 59

## 2021-04-15 ENCOUNTER — Ambulatory Visit: Payer: Medicare Other | Admitting: Hematology and Oncology

## 2021-04-15 ENCOUNTER — Other Ambulatory Visit: Payer: Medicare Other

## 2021-04-29 ENCOUNTER — Ambulatory Visit (INDEPENDENT_AMBULATORY_CARE_PROVIDER_SITE_OTHER): Payer: 59 | Admitting: Podiatry

## 2021-04-29 ENCOUNTER — Encounter: Payer: Self-pay | Admitting: Podiatry

## 2021-04-29 ENCOUNTER — Other Ambulatory Visit: Payer: Self-pay

## 2021-04-29 DIAGNOSIS — M79675 Pain in left toe(s): Secondary | ICD-10-CM | POA: Diagnosis not present

## 2021-04-29 DIAGNOSIS — D696 Thrombocytopenia, unspecified: Secondary | ICD-10-CM | POA: Diagnosis not present

## 2021-04-29 DIAGNOSIS — M79674 Pain in right toe(s): Secondary | ICD-10-CM | POA: Diagnosis not present

## 2021-04-29 DIAGNOSIS — B351 Tinea unguium: Secondary | ICD-10-CM

## 2021-04-29 NOTE — Progress Notes (Signed)
This patient returns to my office for at risk foot care.  This patient requires this care by a professional since this patient will be at risk due to having thrombocytopenia.   This patient is unable to cut nails herself since the patient cannot reach her nails.These nails are painful walking and wearing shoes.  This patient presents for at risk foot care today.  General Appearance  Alert, conversant and in no acute stress.  Vascular  Dorsalis pedis and posterior tibial  pulses are palpable  bilaterally.  Capillary return is within normal limits  bilaterally. Temperature is within normal limits  bilaterally.  Neurologic  Senn-Weinstein monofilament wire test within normal limits  bilaterally. Muscle power within normal limits bilaterally.  Nails Thick disfigured discolored nails with subungual debris  from hallux to fifth toes bilaterally. No evidence of bacterial infection or drainage bilaterally.  Orthopedic  No limitations of motion  feet .  No crepitus or effusions noted.  No bony pathology or digital deformities noted.  Skin  normotropic skin with no porokeratosis noted bilaterally.  No signs of infections or ulcers noted.     Onychomycosis  Pain in right toes  Pain in left toes  Consent was obtained for treatment procedures.   Mechanical debridement of nails 1-5  bilaterally performed with a nail nipper.  Filed with dremel without incident.    Return office visit   3 months                   Told patient to return for periodic foot care and evaluation due to potential at risk complications.   Helane Gunther DPM

## 2021-05-21 ENCOUNTER — Other Ambulatory Visit: Payer: Self-pay | Admitting: Cardiovascular Disease

## 2021-05-21 NOTE — Telephone Encounter (Signed)
Sherryll Burger is not refilled by the coumadin clinic.

## 2021-06-11 ENCOUNTER — Encounter: Payer: Self-pay | Admitting: Hematology and Oncology

## 2021-06-24 ENCOUNTER — Ambulatory Visit: Payer: Medicare Other | Admitting: Physician Assistant

## 2021-06-24 NOTE — Progress Notes (Deleted)
Cardiology Office Note:    Date:  06/24/2021   ID:  [name], [name] 1941-02-21, MRN [id number]  PCP:  [name] Cagey, MD   [facility] Providers Cardiologist:  Tonny Bollman, MD { Click to update primary MD,subspecialty MD or APP then REFRESH:1}  ***  Referring MD: [name] Cagey, MD   Chief Complaint:  No chief complaint on file.    Patient Profile:    [name] is a 80 y.o. female with:  Coronary artery disease S/p NSTEMI >>  CABG in 2009 Myoview 10/2018: ant-sept, apical infarct, no ischemia Chronic systolic CHF Ischemic cardiomyopathy Eval by Dr. 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 Left ventricular ejection fraction, by estimation, is 30%. The left  ventricle has moderate to severely decreased function. The left ventricle  demonstrates regional wall motion abnormalities with mid to apical  inferoseptal and anteroseptal akinesis,  apical inferior and anterior akinesis, akinesis of the apex. Left  ventricular diastolic parameters are consistent with Grade I diastolic  dysfunction (impaired relaxation).   2. Right ventricular systolic function is mildly reduced. The right  ventricular size is normal. There is normal pulmonary artery systolic  pressure. The estimated right ventricular systolic pressure is 31.5 mmHg.   3. The mitral valve is normal in structure. Trivial mitral valve  regurgitation. No evidence of mitral stenosis.   4. The aortic valve is tricuspid. Aortic valve regurgitation is not  visualized. Mild aortic valve sclerosis is present, with no evidence of  aortic valve stenosis.   5. The inferior vena cava is normal in size with greater than 50%  respiratory variability, suggesting right atrial pressure of 3 mmHg.    Myoview 11/03/2018  Abnormal, low risk stress nuclear study with prior anteroseptal and apical infarct; no ischemia; EF 46 with akinesis of the apex.   Echo (06/23/13):   Mild focal basal septal hypertrophy, EF 35-40%, anteroseptal and apical HK, grade 2 diastolic dysfunction, mild MR, reduced RV systolic function, moderate TR, PASP 47.       Echocardiogram (5/14):  Moderate LVH, EF 30-35%, anteroseptal and apical HK.    Myoview (11/04/11):   Small inf scar, mod ant apical and septal scar, no ischemia, EF 31%.  Med Rx continued.     History of Present Illness: [name] was last seen by Dr. Excell Seltzer in 8/21.  She returns for annual f/u.  ***        Past Medical History:  Diagnosis Date   CAD (coronary artery disease)    s/p NSTEMI 2009 - s/p CABG 2009: L-LAD, S-OM1 and OM2, S-Dx, S-PDA.;  Myoview 11/04/11:  Small inf scar, mod ant apical and septal scar, no ischemia, EF 31%.// Myoview 10/2018:  EF 46, ant-sept and apical infarct; no ischemia, apical AK; Low Risk    Cardiomyopathy, ischemic    a.  LVEF 35-40% // b. Echo 5/14: Moderate LVH, EF 30-35%, mid to distal ant-septal, apical HK // c. Echo  9/14: Mild focal basal septal hypertrophy, EF 35-40%, anteroseptal and apical HK, grade 2 diastolic dysfunction, mild MR, reduced RV systolic function, moderate TR, PASP 47.       Chronic systolic heart failure (HCC)    HIT (heparin-induced thrombocytopenia) (HCC)    HLD (hyperlipidemia)    HTN (hypertension)  Other emphysema (HCC)     Current Medications: No outpatient medications have been marked as taking for the 06/24/21 encounter (Appointment) with Tereso Newcomer T, PA-C.     Allergies:   Patient has no known allergies.   Social History   Tobacco Use   Smoking status: Former    Packs/day: 1.00    Years: 44.00    Pack years: 44.00    Types: Cigarettes    Quit date: 10/20/2007    Years since quitting: 13.6   Smokeless tobacco: Never   Tobacco comments:    started at age 89s.    Vaping Use   Vaping Use: Never used  Substance Use Topics   Alcohol use: No   Drug use: No     Family Hx: The patient's family history includes Asthma in her mother; Diabetes in her mother; Hypertension in her mother; Rheum arthritis in her mother.  ROS   EKGs/Labs/Other Test Reviewed:    EKG:  EKG is *** ordered today.  The ekg ordered today demonstrates ***  Recent Labs: 08/16/2020: Hemoglobin 13.9; Platelets 100   Recent Lipid Panel Lab Results  Component Value Date/Time   CHOL 100 06/14/2019 12:50 PM   TRIG 125 06/14/2019 12:50 PM   HDL 34 (L) 06/14/2019 12:50 PM   LDLCALC 41 06/14/2019 12:50 PM      Risk Assessment/Calculations:   {Does this patient have ATRIAL FIBRILLATION?:[id number]}      Physical Exam:    VS:  There were no vitals taken for this visit.    Wt Readings from Last 3 Encounters:  08/16/20 173 lb 12.8 oz (78.8 kg)  05/29/20 175 lb 12.8 oz (79.7 kg)  05/08/20 173 lb (78.5 kg)     Physical Exam ***     ASSESSMENT & PLAN:    {Select for Dx:25819} 1. Coronary artery disease involving native coronary artery of native heart without angina pectoris S/p NSTEMI in 2009 managed with CABG.  Myoview in 10/2018 demonstrated scar but no ischemia.  She has not had any angina.  Continue current regimen which includes ASA 81 mg, Carvedilol 6.25 mg twice daily, Isosorbide Dinitrate (BiDil), Rosuvastatin, Valsartan (Entresto).  Follow up with me or Dr. Excell Seltzer in 6 mos via Telemedicine.    2. Chronic systolic heart failure (HCC) EF 46 by Myoview in 2020.  She is NYHA IIb-III.  She is mainly limited by COPD.  Volume status is optimal.  Continue Entresto, beta-blocker (Carvedilol), Spironolactone and nitrates/hydralazine (BiDil).     3. Essential hypertension The patient's blood pressure is controlled on her current regimen.  Continue current carvedilol, isosorbide/hydralazine, Entresto, spironolactone.   4. Hyperlipidemia, unspecified hyperlipidemia  type LDL optimal on most recent lab work.  Continue current Rx with rosuvastatin 40 mg daily.    {Are you ordering a CV Procedure (e.g. stress test, cath, DCCV, TEE, etc)?   Press F2        :[id number]}    Dispo:  No follow-ups on file.   Medication Adjustments/Labs and Tests Ordered: Current medicines are reviewed at length with the patient today.  Concerns regarding medicines are outlined above.  Tests Ordered: No orders of the defined types were placed in this encounter.  Medication Changes: No orders of the defined types were placed in this encounter.   Signed, Tereso Newcomer, PA-C  06/24/2021 7:36 AM    [facility] [address] [place], [place], [place]  [address] Phone: [phone]; Fax: [phone]

## 2021-07-02 ENCOUNTER — Ambulatory Visit
Admission: RE | Admit: 2021-07-02 | Discharge: 2021-07-02 | Disposition: A | Discharge status: Home / Self Care | Payer: Medicare Other | Source: Ambulatory Visit | Attending: Surgical Oncology | Admitting: Surgical Oncology

## 2021-07-02 ENCOUNTER — Other Ambulatory Visit: Payer: Self-pay | Admitting: Surgical Oncology

## 2021-07-02 ENCOUNTER — Other Ambulatory Visit: Payer: Self-pay

## 2021-07-02 DIAGNOSIS — M79605 Pain in left leg: Secondary | ICD-10-CM

## 2021-07-18 ENCOUNTER — Other Ambulatory Visit: Payer: 59

## 2021-07-21 ENCOUNTER — Other Ambulatory Visit: Payer: Self-pay

## 2021-07-21 ENCOUNTER — Ambulatory Visit
Admission: RE | Admit: 2021-07-21 | Discharge: 2021-07-21 | Disposition: A | Discharge status: Home / Self Care | Payer: Medicare Other | Source: Ambulatory Visit | Attending: General Practice | Admitting: General Practice

## 2021-07-21 ENCOUNTER — Encounter: Payer: Self-pay | Admitting: Hematology and Oncology

## 2021-07-21 DIAGNOSIS — E2839 Other primary ovarian failure: Secondary | ICD-10-CM

## 2021-07-21 DIAGNOSIS — Z780 Asymptomatic menopausal state: Secondary | ICD-10-CM

## 2021-07-29 ENCOUNTER — Encounter: Payer: Self-pay | Admitting: Hematology and Oncology

## 2021-08-05 ENCOUNTER — Ambulatory Visit: Payer: 59 | Admitting: Podiatry

## 2021-08-19 ENCOUNTER — Other Ambulatory Visit: Payer: Self-pay | Admitting: Cardiovascular Disease

## 2021-09-18 ENCOUNTER — Other Ambulatory Visit: Payer: Self-pay | Admitting: Cardiovascular Disease

## 2021-09-18 DIAGNOSIS — I5022 Chronic systolic (congestive) heart failure: Secondary | ICD-10-CM

## 2021-09-18 DIAGNOSIS — R0602 Shortness of breath: Secondary | ICD-10-CM

## 2021-10-01 ENCOUNTER — Ambulatory Visit
Admission: RE | Admit: 2021-10-01 | Discharge: 2021-10-01 | Disposition: A | Discharge status: Home / Self Care | Payer: Medicare Other | Source: Ambulatory Visit | Attending: Family Medicine | Admitting: Family Medicine

## 2021-10-01 ENCOUNTER — Other Ambulatory Visit: Payer: Self-pay | Admitting: Family Medicine

## 2021-10-01 ENCOUNTER — Other Ambulatory Visit: Payer: Self-pay

## 2021-10-01 DIAGNOSIS — M25552 Pain in left hip: Secondary | ICD-10-CM

## 2021-10-07 ENCOUNTER — Other Ambulatory Visit: Payer: Self-pay | Admitting: Cardiovascular Disease

## 2021-10-07 ENCOUNTER — Other Ambulatory Visit: Payer: Self-pay | Admitting: Physician Assistant

## 2021-10-09 ENCOUNTER — Other Ambulatory Visit: Payer: Self-pay | Admitting: Cardiovascular Disease

## 2021-11-08 ENCOUNTER — Other Ambulatory Visit: Payer: Self-pay | Admitting: Cardiovascular Disease

## 2021-11-10 ENCOUNTER — Other Ambulatory Visit: Payer: Self-pay | Admitting: Cardiovascular Disease

## 2022-01-07 ENCOUNTER — Other Ambulatory Visit: Payer: Self-pay | Admitting: Cardiovascular Disease

## 2022-01-07 DIAGNOSIS — I5022 Chronic systolic (congestive) heart failure: Secondary | ICD-10-CM

## 2022-02-02 ENCOUNTER — Encounter: Payer: Self-pay | Admitting: Cardiovascular Disease

## 2022-02-02 ENCOUNTER — Ambulatory Visit (INDEPENDENT_AMBULATORY_CARE_PROVIDER_SITE_OTHER): Payer: Medicare Other | Admitting: Cardiovascular Disease

## 2022-02-02 ENCOUNTER — Ambulatory Visit (INDEPENDENT_AMBULATORY_CARE_PROVIDER_SITE_OTHER): Payer: Medicare Other

## 2022-02-02 VITALS — BP 142/80 | HR 74 | Ht 65.0 in | Wt 183.8 lb

## 2022-02-02 DIAGNOSIS — I2510 Atherosclerotic heart disease of native coronary artery without angina pectoris: Secondary | ICD-10-CM | POA: Diagnosis not present

## 2022-02-02 DIAGNOSIS — I5022 Chronic systolic (congestive) heart failure: Secondary | ICD-10-CM | POA: Diagnosis not present

## 2022-02-02 DIAGNOSIS — Z79899 Other long term (current) drug therapy: Secondary | ICD-10-CM

## 2022-02-02 DIAGNOSIS — I10 Essential (primary) hypertension: Secondary | ICD-10-CM

## 2022-02-02 DIAGNOSIS — I493 Ventricular premature depolarization: Secondary | ICD-10-CM

## 2022-02-02 LAB — LIPID PANEL
Chol/HDL Ratio: 2.4 ratio (ref 0.0–4.4)
Cholesterol, Total: 98 mg/dL — ABNORMAL LOW (ref 100–199)
HDL: 41 mg/dL (ref >39)
LDL Chol Calc (NIH): 40 mg/dL (ref 0–99)
Triglycerides: 84 mg/dL (ref 0–149)
VLDL Cholesterol Cal: 17 mg/dL (ref 5–40)

## 2022-02-02 LAB — CBC
Hematocrit: 46.1 % (ref 34.0–46.6)
Hemoglobin: 15 g/dL (ref 11.1–15.9)
MCH: 27.1 pg (ref 26.6–33.0)
MCHC: 32.5 g/dL (ref 31.5–35.7)
MCV: 83 fL (ref 79–97)
Platelets: 94 10*3/uL — CL (ref 150–450)
RBC: 5.54 x10E6/uL — ABNORMAL HIGH (ref 3.77–5.28)
RDW: 13 % (ref 11.7–15.4)
WBC: 9.8 10*3/uL (ref 3.4–10.8)

## 2022-02-02 LAB — COMPREHENSIVE METABOLIC PANEL
ALT: 11 IU/L (ref 0–32)
AST: 13 IU/L (ref 0–40)
Albumin/Globulin Ratio: 1.4 (ref 1.2–2.2)
Albumin: 3.9 g/dL (ref 3.6–4.6)
Alkaline Phosphatase: 87 IU/L (ref 44–121)
BUN/Creatinine Ratio: 13 (ref 12–28)
BUN: 12 mg/dL (ref 8–27)
Bilirubin Total: 0.4 mg/dL (ref 0.0–1.2)
CO2: 22 mmol/L (ref 20–29)
Calcium: 8.9 mg/dL (ref 8.7–10.3)
Chloride: 105 mmol/L (ref 96–106)
Creatinine, Ser: 0.94 mg/dL (ref 0.57–1.00)
Globulin, Total: 2.7 g/dL (ref 1.5–4.5)
Glucose: 95 mg/dL (ref 70–99)
Potassium: 4.3 mmol/L (ref 3.5–5.2)
Sodium: 143 mmol/L (ref 134–144)
Total Protein: 6.6 g/dL (ref 6.0–8.5)
eGFR: 61 mL/min/{1.73_m2} (ref >59)

## 2022-02-02 MED ORDER — ENTRESTO 49-51 MG PO TABS: 1.0000 | ORAL_TABLET | Freq: Two times a day (BID) | ORAL | 3 refills | Status: DC

## 2022-02-02 NOTE — Progress Notes (Unsigned)
Enrolled for [employer] to mail a ZIO XT long term holter monitor to the patients address on file.  

## 2022-02-02 NOTE — Progress Notes (Signed)
?Cardiology Office Note:   ? ?Date:  02/02/2022  ? ?ID:  [name], DOB [birth year], MRN [id number] ? ?PCP:  [name], MD ?  ?[facility] Providers ?Cardiologist:  Tonny Bollman, MD    ? ?Referring MD: [name], MD  ? ?Chief Complaint  ?Patient presents with  ? Coronary Artery Disease  ? ? ?History of Present Illness:   ? ?[name] is a 81 y.o. female with a hx of: ?Coronary artery disease ?S/p NSTEMI >>  CABG in 2009 ?Myoview 10/2018: ant-sept, apical infarct, no ischemia ?Chronic systolic CHF ?Ischemic cardiomyopathy ?Eval by Dr. 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 ?The patient was a longtime smoker before her bypass surgery in 2009.  She has not had a cigarette since that time.  She does have chronic shortness of breath but reports no recent change.  She denies orthopnea, PND, or leg swelling.  She has had no chest pain or pressure.  She reports good compliance with her medications.  She has had a difficult time after the death of her daughter last year. ? ?Past Medical History:  ?Diagnosis Date  ? CAD (coronary artery disease)   ? s/p NSTEMI 2009 - s/p CABG 2009: L-LAD, S-OM1 and OM2, S-Dx, S-PDA.;  Myoview 11/04/11:  Small inf scar, mod ant apical and septal scar, no ischemia, EF 31%.// Myoview 10/2018:  EF 46, ant-sept and apical infarct; no ischemia, apical AK; Low Risk   ? Cardiomyopathy, ischemic   ? a.  LVEF 35-40% // b. Echo 5/14: Moderate LVH, EF 30-35%, mid to distal ant-septal, apical HK // c. Echo  9/14: Mild focal basal septal hypertrophy, EF 35-40%, anteroseptal and apical HK, grade 2 diastolic dysfunction, mild MR, reduced RV systolic function, moderate TR, PASP 47.      ? Chronic systolic heart failure (HCC)   ? HIT (heparin-induced thrombocytopenia) (HCC)   ? HLD (hyperlipidemia)   ? HTN (hypertension)    ? Other emphysema (HCC)   ? ? ?Past Surgical History:  ?Procedure Laterality Date  ? CORONARY ARTERY BYPASS GRAFT  November 21, 2007  ? 5 vessel  ? ? ?Current Medications: ?Current Meds  ?Medication Sig  ? acetaminophen-codeine (TYLENOL #2) 300-15 MG tablet Take 1 tablet by mouth 2 (two) times daily as needed.  ? albuterol (PROVENTIL HFA;VENTOLIN HFA) 108 (90 BASE) MCG/ACT inhaler 1-2 puffs every 6 (six) hours as needed (wheezing). 1-2 puffs every 4-6 hours as needed  ? albuterol (PROVENTIL) (5 MG/ML) 0.5% nebulizer solution Take 2.5 mg by nebulization every 2 (two) hours as needed for wheezing or shortness of breath.  ? alendronate (FOSAMAX) 70 MG tablet Take 70 mg by mouth once a week.  ? allopurinol (ZYLOPRIM) 100 MG tablet Take 100 mg by mouth daily.  ? aspirin 81 MG tablet Take 81 mg by mouth daily.  ? BIDIL 20-37.5 MG tablet TAKE 1 TABLET BY MOUTH THREE TIMES DAILY  ? budesonide-formoterol (SYMBICORT) 160-4.5 MCG/ACT inhaler Inhale 2 puffs into the lungs 2 (two) times daily.  ? carvedilol (COREG) 6.25 MG tablet Take 1 tablet (6.25 mg total) by mouth 2 (two) times daily with a meal. Please make overdue appt with Dr. Excell Seltzer before anymore refills. Thank you 1st attempt  ? furosemide (LASIX) 40 MG tablet Take 1 tablet (40 mg total) by mouth daily. Please  keep upcoming appt. In April with Dr. Excell Seltzer in order to receive future refills. Thank You.  ? ipratropium (ATROVENT) 0.02 % nebulizer solution Take 0.5 mg by nebulization 4 (four) times daily.  ? Multiple Vitamin (MULTIVITAMIN) tablet Take 1 tablet by mouth daily.  ? nitroGLYCERIN (NITROSTAT) 0.4 MG SL tablet PLACE 1 TABLET UNDER TONGUE EVERY 5 MINUTES AS NEEDED FOR CHEST PAIN  ? Omega 3 1000 MG CAPS Take by mouth daily.  ? potassium chloride SA (K-DUR) 20 MEQ tablet Take 20 mEq by mouth daily.  ? rosuvastatin (CRESTOR) 40 MG tablet Take 1 tablet (40 mg total) by mouth daily.  ? sacubitril-valsartan (ENTRESTO) 49-51 MG Take 1 tablet by mouth 2 (two) times daily.   ? spironolactone (ALDACTONE) 25 MG tablet TAKE 1 TABLET(25 MG) BY MOUTH DAILY  ? tiotropium (SPIRIVA) 18 MCG inhalation capsule Place 18 mcg into inhaler and inhale daily.  ? traMADol (ULTRAM) 50 MG tablet Take 50 mg by mouth every 6 (six) hours as needed.  ? Turmeric (QC TUMERIC COMPLEX) 500 MG CAPS Take by mouth. Patient taking 400 mg daily  ? [DISCONTINUED] ENTRESTO 24-26 MG TAKE 1 TABLET BY MOUTH TWICE DAILY. MAKE APPOINTMENT WITH PROVIDER FOR FURTHER REFILLS- FIRST ATTEMPT.  ?  ? ?Allergies:   Patient has no known allergies.  ? ?Social History  ? ?Socioeconomic History  ? Marital status: Legally Separated  ?  Spouse name: Not on file  ? Number of children: Not on file  ? Years of education: Not on file  ? Highest education level: Not on file  ?Occupational History  ? Occupation: does not currently work  ?Tobacco Use  ? Smoking status: Former  ?  Packs/day: 1.00  ?  Years: 44.00  ?  Pack years: 44.00  ?  Types: Cigarettes  ?  Quit date: 10/20/2007  ?  Years since quitting: 14.2  ? Smokeless tobacco: Never  ? Tobacco comments:  ?  started at age 74s.   ?Vaping Use  ? Vaping Use: Never used  ?Substance and Sexual Activity  ? Alcohol use: No  ? Drug use: No  ? Sexual activity: Not on file  ?Other Topics Concern  ? Not on file  ?Social History Narrative  ? Pt lives alone in [place].   ? Children live nearby  ? ?Social Determinants of Health  ? ?Financial Resource Strain: Not on file  ?Food Insecurity: Not on file  ?Transportation Needs: Not on file  ?Physical Activity: Not on file  ?Stress: Not on file  ?Social Connections: Not on file  ?  ? ?Family History: ?The patient's family history includes Asthma in her mother; Diabetes in her mother; Hypertension in her mother; Rheum arthritis in her mother. ? ?ROS:   ?Please see the history of present illness.    ?All other systems reviewed and are negative. ? ?EKGs/Labs/Other Studies Reviewed:   ? ?The following studies were reviewed today: ?Echo 06/11/2020: ? 1. Left  ventricular ejection fraction, by estimation, is 30%. The left  ?ventricle has moderate to severely decreased function. The left ventricle  ?demonstrates regional wall motion abnormalities with mid to apical  ?inferoseptal and anteroseptal akinesis,  ?apical inferior and anterior akinesis, akinesis of the apex. Left  ?ventricular diastolic parameters are consistent with Grade I diastolic  ?dysfunction (impaired relaxation).  ? 2. Right ventricular systolic function is mildly reduced. The right  ?ventricular size is normal. There is normal pulmonary artery systolic  ?pressure. The estimated right ventricular systolic pressure is 31.5 mmHg.  ?  3. The mitral valve is normal in structure. Trivial mitral valve  ?regurgitation. No evidence of mitral stenosis.  ? 4. The aortic valve is tricuspid. Aortic valve regurgitation is not  ?visualized. Mild aortic valve sclerosis is present, with no evidence of  ?aortic valve stenosis.  ? 5. The inferior vena cava is normal in size with greater than 50%  ?respiratory variability, suggesting right atrial pressure of 3 mmHg.  ? ?Comparison(s): 06/23/13 EF 35-40%. PA pressure 47mmHg. ? ?EKG:  EKG is ordered today.  The ekg ordered today demonstrates normal sinus rhythm 74 bpm, PVCs present, poor R wave progression consider age-indeterminate anterolateral infarct ? ?Recent Labs: ?No results found for requested labs within last 8760 hours.  ?Recent Lipid Panel ?   ?Component Value Date/Time  ? CHOL 100 06/14/2019 1250  ? TRIG 125 06/14/2019 1250  ? HDL 34 (L) 06/14/2019 1250  ? CHOLHDL 2.9 06/14/2019 1250  ? CHOLHDL 3 01/29/2014 1137  ? VLDL 21.2 01/29/2014 1137  ? LDLCALC 41 06/14/2019 1250  ? ? ? ?Risk Assessment/Calculations:   ?  ? ?    ? ?Physical Exam:   ? ?VS:  BP (!) 142/80   Pulse 74   Ht 5\' 5"  (1.651 m)   Wt 183 lb 12.8 oz (83.4 kg)   SpO2 95%   BMI 30.59 kg/m?    ? ?Wt Readings from Last 3 Encounters:  ?02/02/22 183 lb 12.8 oz (83.4 kg)  ?08/16/20 173 lb 12.8 oz (78.8 kg)   ?05/29/20 175 lb 12.8 oz (79.7 kg)  ?  ? ?GEN:  Well nourished, well developed elderly woman in no acute distress ?HEENT: Normal ?NECK: No JVD; No carotid bruits ?LYMPHATICS: No lymphadenopathy ?CARDIAC: RRR

## 2022-02-02 NOTE — Patient Instructions (Addendum)
Medication Instructions:  ?INCREASE Entresto to 49/51mg  daily ?*If you need a refill on your cardiac medications before your next appointment, please call your pharmacy* ? ? ?Lab Work: ?CBC, CMET, Lipids today ?If you have labs (blood work) drawn today and your tests are completely normal, you will receive your results only by: ?MyChart Message (if you have MyChart) OR ?A paper copy in the mail ?If you have any lab test that is abnormal or we need to change your treatment, we will call you to review the results. ? ? ?Testing/Procedures: ?3 day ZIO monitor ?Your physician has recommended that you wear an event monitor. Event monitors are medical devices that record the heart?s electrical activity. Doctors most often [place] these monitors to diagnose arrhythmias. Arrhythmias are problems with the speed or rhythm of the heartbeat. The monitor is a small, portable device. You can wear one while you do your normal daily activities. This is usually used to diagnose what is causing palpitations/syncope (passing out). ? ?ECHO in 6 months at APP visit ?Your physician has requested that you have an echocardiogram. Echocardiography is a painless test that uses sound waves to create images of your heart. It provides your doctor with information about the size and shape of your heart and how well your heart?s chambers and valves are working. This procedure takes approximately one hour. There are no restrictions for this procedure. ? ? ?Follow-Up: ?At [facility], you and your health needs are our priority.  As part of our continuing mission to provide you with exceptional heart care, we have created designated Provider Care Teams.  These Care Teams include your primary Cardiologist (physician) and Advanced Practice Providers (APPs -  Physician Assistants and Nurse Practitioners) who all work together to provide you with the care you need, when you need it. ? ?We recommend signing up for the patient portal called "MyChart".  Sign  up information is provided on this After Visit Summary.  MyChart is used to connect with patients for Virtual Visits (Telemedicine).  Patients are able to view lab/test results, encounter notes, upcoming appointments, etc.  Non-urgent messages can be sent to your provider as well.   ?To learn more about what you can do with MyChart, go to [email or web address].   ? ?Your next appointment:   ?6 month(s) ? ?The format for your next appointment:   ?In Person ? ?Provider:   ?Eligha Bridegroom, NP or Tereso Newcomer, PA-C     Then, Tonny Bollman, MD will plan to see you again in 1 year(s).  ? ?  ? ?ZIO XT- Long Term Monitor Instructions ? ?Your physician has requested you wear a ZIO patch monitor for 3 days.  ?This is a single patch monitor. [employer] supplies one patch monitor per enrollment. Additional ?stickers are not available. Please do not apply patch if you will be having a Nuclear Stress Test,  ?Echocardiogram, Cardiac CT, MRI, or Chest Xray during the period you would be wearing the  ?monitor. The patch cannot be worn during these tests. You cannot remove and re-apply the  ?ZIO XT patch monitor.  ?Your ZIO patch monitor will be mailed 3 day USPS to your address on file. It may take 3-5 days  ?to receive your monitor after you have been enrolled.  ?Once you have received your monitor, please review the enclosed instructions. Your monitor  ?has already been registered assigning a specific monitor serial # to you. ? ?Billing and Patient Assistance Program Information ? ?We have supplied [employer] with any  of your insurance information on file for billing purposes. ?[employer] offers a sliding scale Patient Assistance Program for patients that do not have  ?insurance, or whose insurance does not completely cover the cost of the ZIO monitor.  ?You must apply for the Patient Assistance Program to qualify for this discounted rate.  ?To apply, please call [employer] at [phone], select option 4, select option 2, ask to apply  for  ?Patient Assistance Program. Meredeth Ide will ask your household income, and how many people  ?are in your household. They will quote your out-of-pocket cost based on that information.  ?[employer] will also be able to set up a 43-month, interest-free payment plan if needed. ? ?Applying the monitor ?  ?Shave hair from upper left chest.  ?Hold abrader disc by orange tab. Rub abrader in 40 strokes over the upper left chest as  ?indicated in your monitor instructions.  ?Clean area with 4 enclosed alcohol pads. Let dry.  ?Apply patch as indicated in monitor instructions. Patch will be placed under collarbone on left  ?side of chest with arrow pointing upward.  ?Rub patch adhesive wings for 2 minutes. Remove white label marked "1". Remove the white  ?label marked "2". Rub patch adhesive wings for 2 additional minutes.  ?While looking in a mirror, press and release button in center of patch. A small green light will  ?flash 3-4 times. This will be your only indicator that the monitor has been turned on.  ?Do not shower for the first 24 hours. You may shower after the first 24 hours.  ?Press the button if you feel a symptom. You will hear a small click. Record Date, Time and  ?Symptom in the Patient Logbook.  ?When you are ready to remove the patch, follow instructions on the last 2 pages of Patient  ?Logbook. Stick patch monitor onto the last page of Patient Logbook.  ?Place Patient Logbook in the blue and white box. Use locking tab on box and tape box closed  ?securely. The blue and white box has prepaid postage on it. Please place it in the mailbox as  ?soon as possible. Your physician should have your test results approximately 7 days after the  ?monitor has been mailed back to [facility].  ?Call [facility] at [phone] if you have questions regarding  ?your ZIO XT patch monitor. Call them immediately if you see an orange light blinking on your  ?monitor.  ?If your monitor falls off in less than  4 days, contact our Monitor department at [phone].  ?If your monitor becomes loose or falls off after 4 days call [employer] at [phone] for  ?suggestions on securing your monitor ? ? ?Important Information About Sugar ? ? ? ? ?  ?

## 2022-02-05 ENCOUNTER — Ambulatory Visit: Payer: Medicare Other | Admitting: Cardiovascular Disease

## 2022-02-06 DIAGNOSIS — I493 Ventricular premature depolarization: Secondary | ICD-10-CM | POA: Diagnosis not present

## 2022-03-17 ENCOUNTER — Encounter: Payer: Self-pay | Admitting: Cardiovascular Disease

## 2022-03-18 ENCOUNTER — Telehealth: Payer: Self-pay | Admitting: Cardiovascular Disease

## 2022-03-18 MED ORDER — CARVEDILOL 12.5 MG PO TABS: 12.5000 mg | ORAL_TABLET | Freq: Two times a day (BID) | ORAL | 3 refills | Status: DC

## 2022-03-18 NOTE — Telephone Encounter (Signed)
Sherren Mocha, MD  03/06/2022  2:09 PM EDT     Frequent PVC's noted. No sustained arrhythmia. Please increase carvedilol to 12.5 mg BID if tolerated. Echo planned at time of 6 month follow-up.    Pt returned call to office, reviewed results and she understands to increase her Coreg. She will finish using the 6.25mg  tabs that she has on hand, then will pick up the new rx from the pharmacy.

## 2022-03-18 NOTE — Telephone Encounter (Signed)
Follow Up:     Calling back for results

## 2022-03-18 NOTE — Telephone Encounter (Signed)
Pt returning nurses call regarding monitor results. Call transferred

## 2022-03-30 IMAGING — DX DG CHEST 2V
2 series · 2 of 2 positions shown · non-contrast
Comparison: November 02, 2013.

CLINICAL DATA: Shortness of breath.

EXAM:
CHEST - 2 VIEW

[chest pa (1 of 2)]
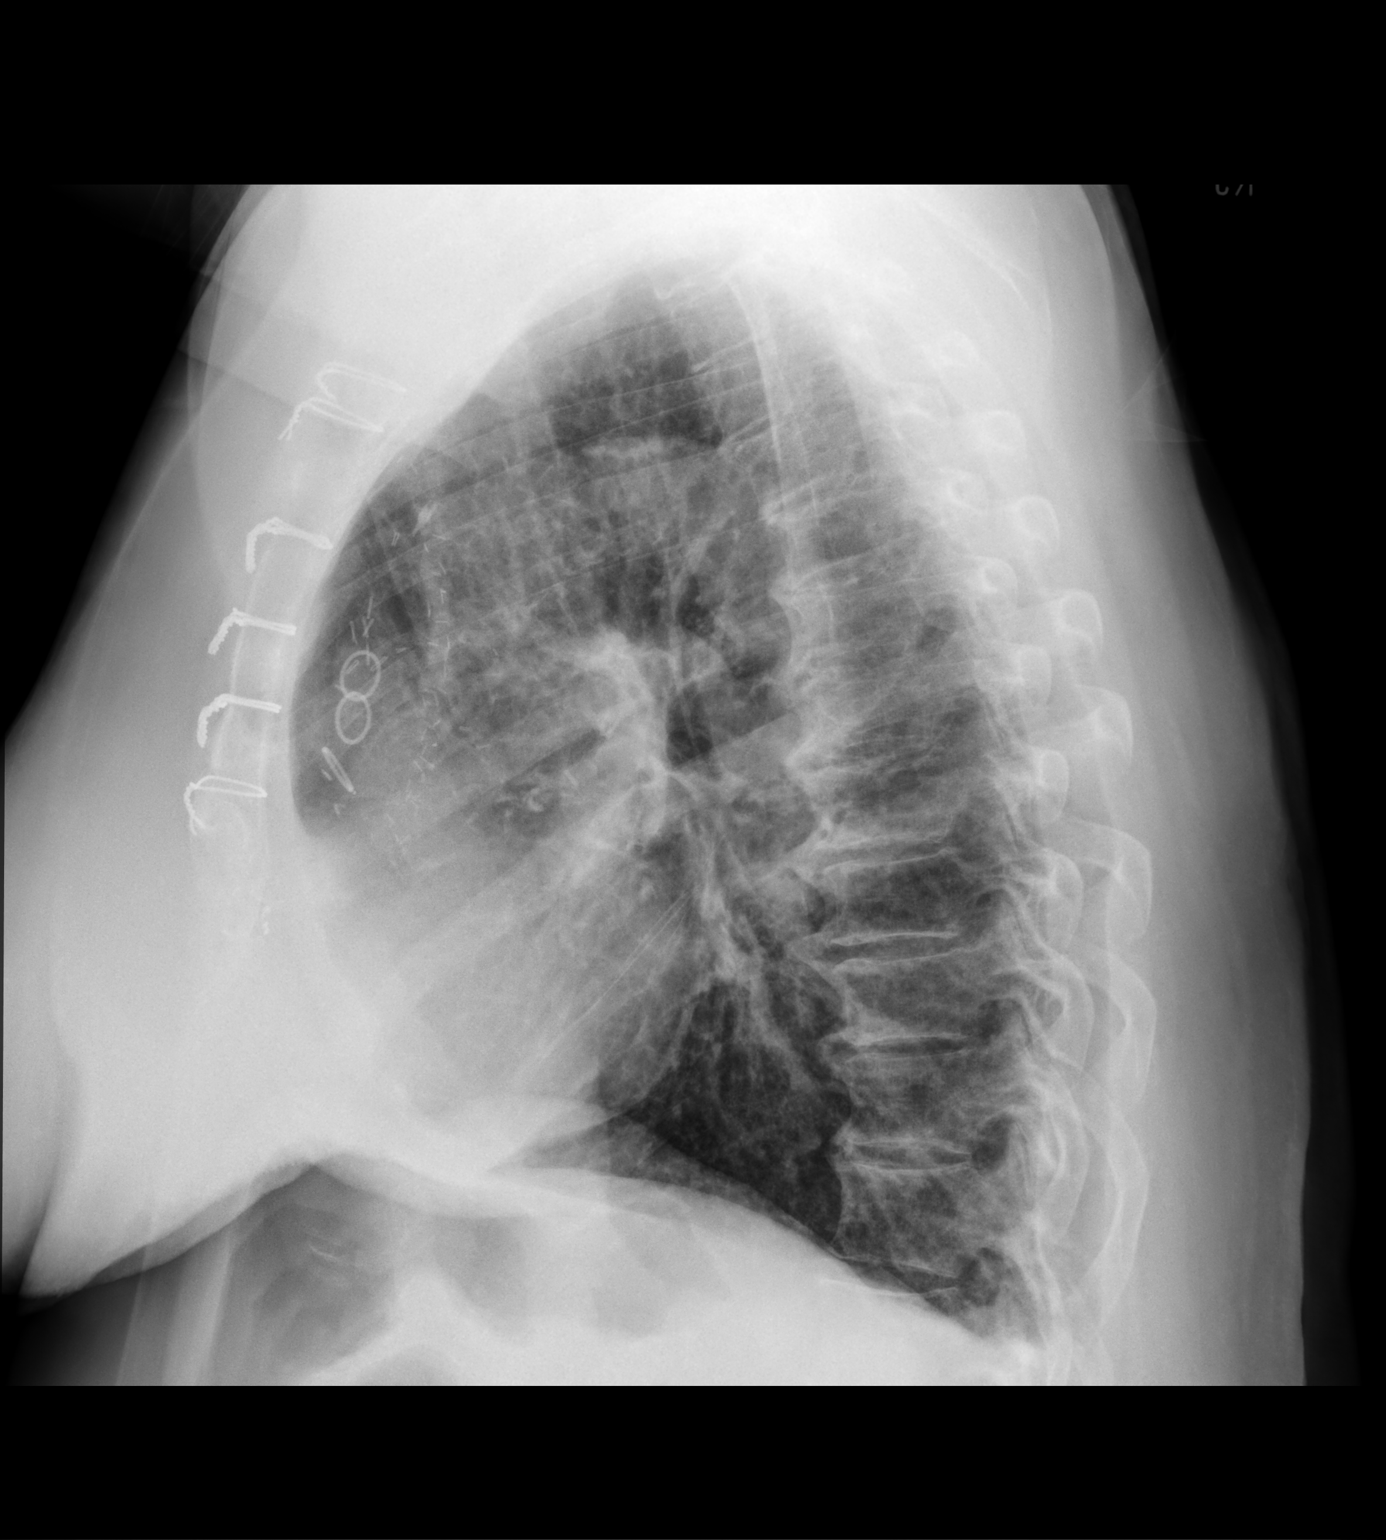

[chest pa (2 of 2)]
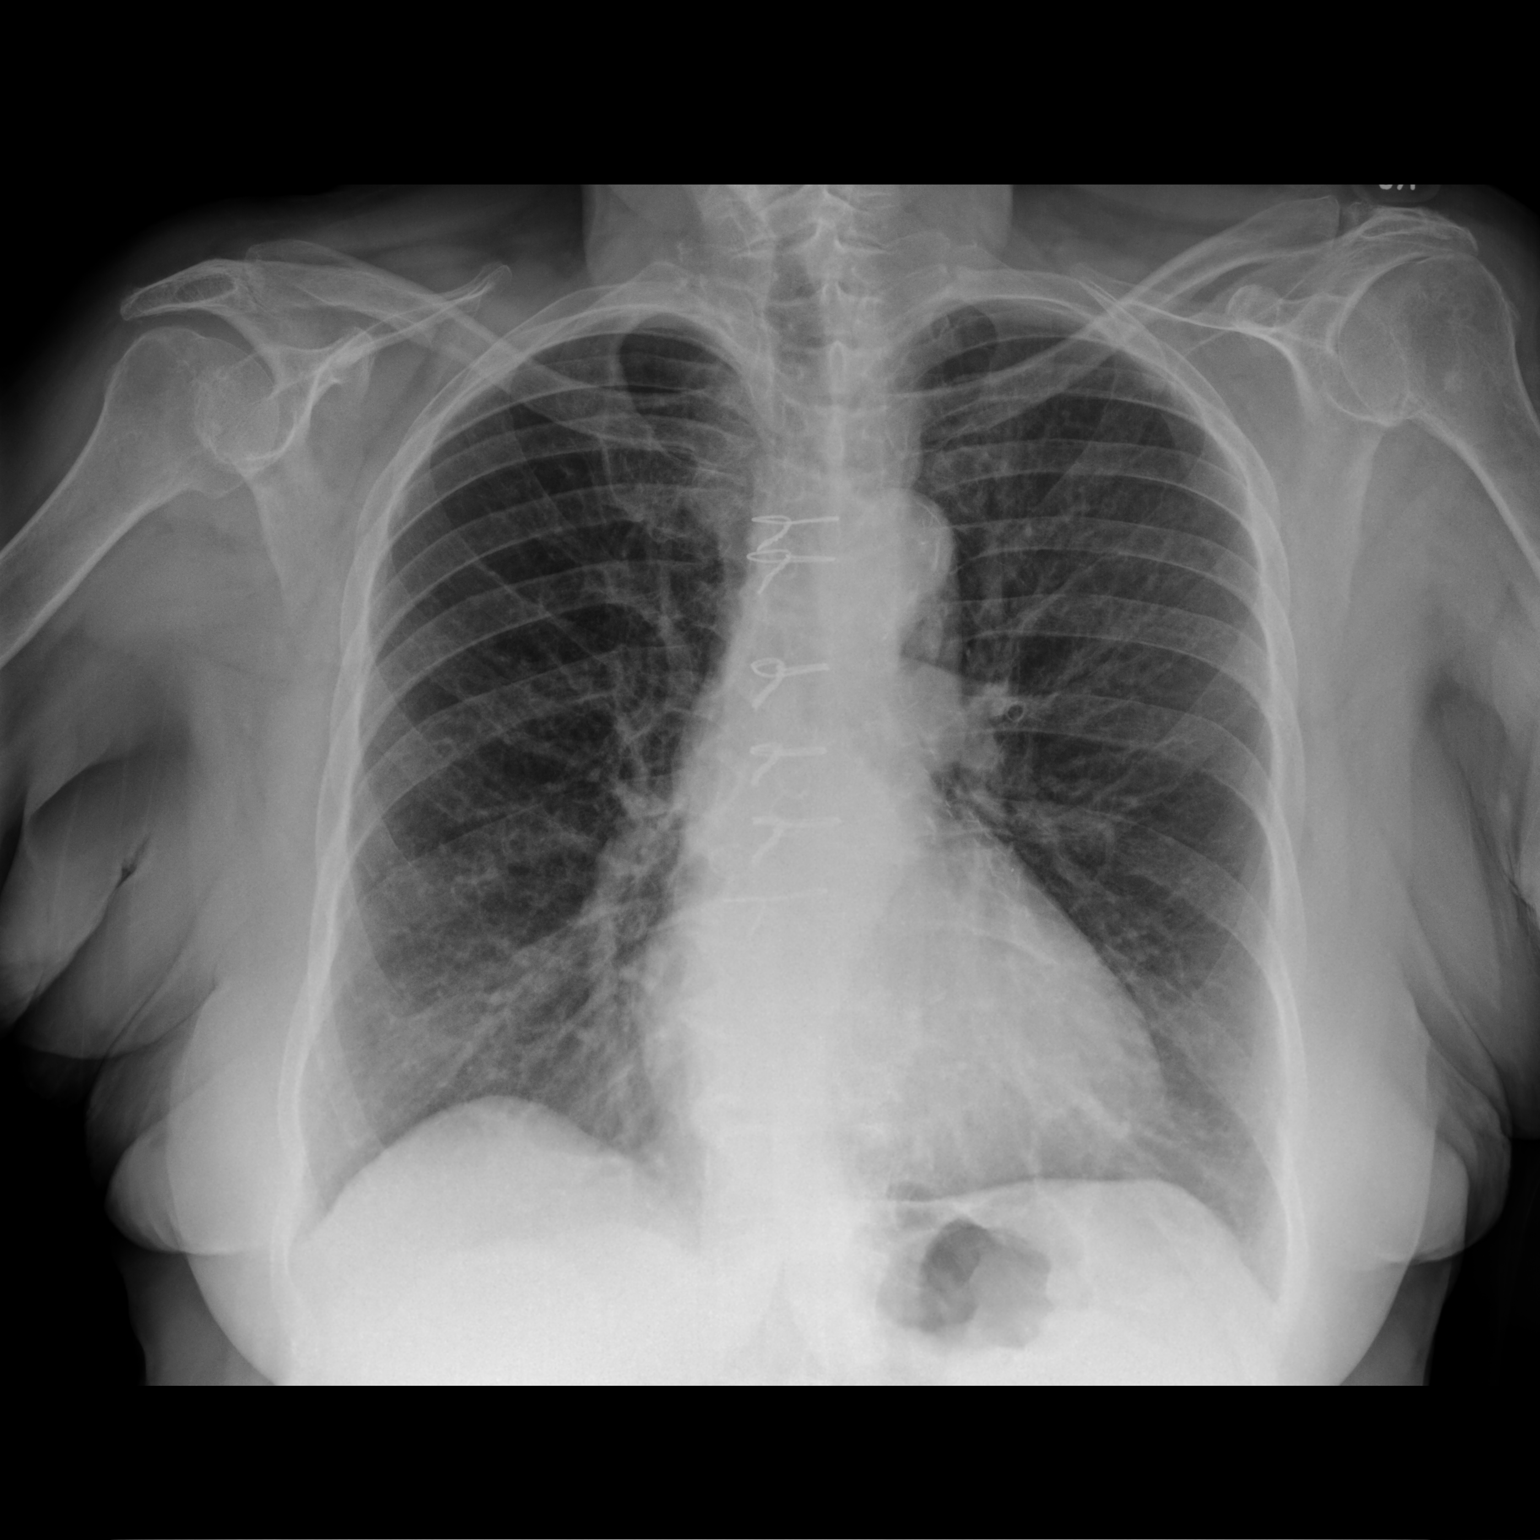

[2 of 2 positions shown; findings below may reference images not displayed]

FINDINGS: The heart size and mediastinal contours are within normal limits.
Status post coronary bypass graft. No pneumothorax or pleural
effusion is noted. Both lungs are clear. The visualized skeletal
structures are unremarkable.
IMPRESSION: No active cardiopulmonary disease.

Aortic Atherosclerosis (PIMBB-VQ2.2).

## 2022-04-19 ENCOUNTER — Other Ambulatory Visit: Payer: Self-pay

## 2022-04-19 ENCOUNTER — Emergency Department (HOSPITAL_COMMUNITY): Payer: Medicare Other

## 2022-04-19 ENCOUNTER — Observation Stay (HOSPITAL_COMMUNITY)
Admission: EM | Admit: 2022-04-19 | Discharge: 2022-04-20 | Disposition: A | Discharge status: Home Health | Payer: Medicare Other | Attending: Family Medicine | Admitting: Family Medicine

## 2022-04-19 ENCOUNTER — Observation Stay (HOSPITAL_COMMUNITY): Payer: Medicare Other

## 2022-04-19 ENCOUNTER — Encounter (HOSPITAL_COMMUNITY): Payer: Self-pay

## 2022-04-19 DIAGNOSIS — I2510 Atherosclerotic heart disease of native coronary artery without angina pectoris: Secondary | ICD-10-CM | POA: Insufficient documentation

## 2022-04-19 DIAGNOSIS — Z7951 Long term (current) use of inhaled steroids: Secondary | ICD-10-CM | POA: Diagnosis not present

## 2022-04-19 DIAGNOSIS — J449 Chronic obstructive pulmonary disease, unspecified: Secondary | ICD-10-CM | POA: Insufficient documentation

## 2022-04-19 DIAGNOSIS — Z951 Presence of aortocoronary bypass graft: Secondary | ICD-10-CM | POA: Insufficient documentation

## 2022-04-19 DIAGNOSIS — E782 Mixed hyperlipidemia: Secondary | ICD-10-CM

## 2022-04-19 DIAGNOSIS — Z20822 Contact with and (suspected) exposure to covid-19: Secondary | ICD-10-CM | POA: Insufficient documentation

## 2022-04-19 DIAGNOSIS — I110 Hypertensive heart disease with heart failure: Secondary | ICD-10-CM | POA: Diagnosis not present

## 2022-04-19 DIAGNOSIS — I5022 Chronic systolic (congestive) heart failure: Secondary | ICD-10-CM | POA: Insufficient documentation

## 2022-04-19 DIAGNOSIS — E875 Hyperkalemia: Secondary | ICD-10-CM | POA: Insufficient documentation

## 2022-04-19 DIAGNOSIS — R4781 Slurred speech: Principal | ICD-10-CM | POA: Insufficient documentation

## 2022-04-19 DIAGNOSIS — H538 Other visual disturbances: Secondary | ICD-10-CM | POA: Insufficient documentation

## 2022-04-19 DIAGNOSIS — Z6831 Body mass index (BMI) 31.0-31.9, adult: Secondary | ICD-10-CM | POA: Insufficient documentation

## 2022-04-19 DIAGNOSIS — Z87891 Personal history of nicotine dependence: Secondary | ICD-10-CM | POA: Insufficient documentation

## 2022-04-19 DIAGNOSIS — R930 Abnormal findings on diagnostic imaging of skull and head, not elsewhere classified: Secondary | ICD-10-CM | POA: Diagnosis not present

## 2022-04-19 DIAGNOSIS — I10 Essential (primary) hypertension: Secondary | ICD-10-CM

## 2022-04-19 DIAGNOSIS — Z79899 Other long term (current) drug therapy: Secondary | ICD-10-CM | POA: Insufficient documentation

## 2022-04-19 DIAGNOSIS — G459 Transient cerebral ischemic attack, unspecified: Secondary | ICD-10-CM | POA: Insufficient documentation

## 2022-04-19 DIAGNOSIS — D696 Thrombocytopenia, unspecified: Secondary | ICD-10-CM | POA: Diagnosis not present

## 2022-04-19 DIAGNOSIS — N1832 Chronic kidney disease, stage 3b: Secondary | ICD-10-CM | POA: Insufficient documentation

## 2022-04-19 DIAGNOSIS — N179 Acute kidney failure, unspecified: Secondary | ICD-10-CM | POA: Insufficient documentation

## 2022-04-19 DIAGNOSIS — E785 Hyperlipidemia, unspecified: Secondary | ICD-10-CM | POA: Diagnosis present

## 2022-04-19 DIAGNOSIS — E669 Obesity, unspecified: Secondary | ICD-10-CM | POA: Diagnosis not present

## 2022-04-19 DIAGNOSIS — R42 Dizziness and giddiness: Secondary | ICD-10-CM | POA: Diagnosis present

## 2022-04-19 DIAGNOSIS — R2990 Unspecified symptoms and signs involving the nervous system: Secondary | ICD-10-CM

## 2022-04-19 LAB — URINALYSIS, ROUTINE W REFLEX MICROSCOPIC
Bilirubin Urine: NEGATIVE
Glucose, UA: NEGATIVE mg/dL
Hgb urine dipstick: NEGATIVE
Ketones, ur: NEGATIVE mg/dL
Leukocytes,Ua: NEGATIVE
Nitrite: NEGATIVE
Protein, ur: NEGATIVE mg/dL
Specific Gravity, Urine: 1.013 (ref 1.005–1.030)
pH: 6 (ref 5.0–8.0)

## 2022-04-19 LAB — CBC
HCT: 48.8 % — ABNORMAL HIGH (ref 36.0–46.0)
Hemoglobin: 15.4 g/dL — ABNORMAL HIGH (ref 12.0–15.0)
MCH: 28.4 pg (ref 26.0–34.0)
MCHC: 31.6 g/dL (ref 30.0–36.0)
MCV: 89.9 fL (ref 80.0–100.0)
Platelets: 96 10*3/uL — ABNORMAL LOW (ref 150–400)
RBC: 5.43 MIL/uL — ABNORMAL HIGH (ref 3.87–5.11)
RDW: 14.5 % (ref 11.5–15.5)
WBC: 11.1 10*3/uL — ABNORMAL HIGH (ref 4.0–10.5)
nRBC: 0 % (ref 0.0–0.2)

## 2022-04-19 LAB — COMPREHENSIVE METABOLIC PANEL
ALT: 8 U/L (ref 0–44)
AST: 15 U/L (ref 15–41)
Albumin: 3.6 g/dL (ref 3.5–5.0)
Alkaline Phosphatase: 74 U/L (ref 38–126)
Anion gap: 9 (ref 5–15)
BUN: 12 mg/dL (ref 8–23)
CO2: 27 mmol/L (ref 22–32)
Calcium: 8.8 mg/dL — ABNORMAL LOW (ref 8.9–10.3)
Chloride: 103 mmol/L (ref 98–111)
Creatinine, Ser: 1.17 mg/dL — ABNORMAL HIGH (ref 0.44–1.00)
GFR, Estimated: 46 mL/min — ABNORMAL LOW (ref >60)
Glucose, Bld: 107 mg/dL — ABNORMAL HIGH (ref 70–99)
Potassium: 3.8 mmol/L (ref 3.5–5.1)
Sodium: 139 mmol/L (ref 135–145)
Total Bilirubin: 1.1 mg/dL (ref 0.3–1.2)
Total Protein: 7.1 g/dL (ref 6.5–8.1)

## 2022-04-19 LAB — PROTIME-INR
INR: 1.1 (ref 0.8–1.2)
Prothrombin Time: 14.1 seconds (ref 11.4–15.2)

## 2022-04-19 LAB — DIFFERENTIAL
Abs Immature Granulocytes: 0.05 10*3/uL (ref 0.00–0.07)
Basophils Absolute: 0.1 10*3/uL (ref 0.0–0.1)
Basophils Relative: 1 %
Eosinophils Absolute: 0.1 10*3/uL (ref 0.0–0.5)
Eosinophils Relative: 1 %
Immature Granulocytes: 1 %
Lymphocytes Relative: 16 %
Lymphs Abs: 1.8 10*3/uL (ref 0.7–4.0)
Monocytes Absolute: 0.8 10*3/uL (ref 0.1–1.0)
Monocytes Relative: 7 %
Neutro Abs: 8.3 10*3/uL — ABNORMAL HIGH (ref 1.7–7.7)
Neutrophils Relative %: 74 %

## 2022-04-19 LAB — APTT: aPTT: 31 seconds (ref 24–36)

## 2022-04-19 LAB — RAPID URINE DRUG SCREEN, HOSP PERFORMED
Amphetamines: NOT DETECTED
Barbiturates: NOT DETECTED
Benzodiazepines: NOT DETECTED
Cocaine: NOT DETECTED
Opiates: POSITIVE — AB
Tetrahydrocannabinol: NOT DETECTED

## 2022-04-19 LAB — CBG MONITORING, ED: Glucose-Capillary: 117 mg/dL — ABNORMAL HIGH (ref 70–99)

## 2022-04-19 LAB — RESP PANEL BY RT-PCR (FLU A&B, COVID) ARPGX2
Influenza A by PCR: NEGATIVE
Influenza B by PCR: NEGATIVE
SARS Coronavirus 2 by RT PCR: NEGATIVE

## 2022-04-19 LAB — HEMOGLOBIN A1C
Hgb A1c MFr Bld: 6.5 % — ABNORMAL HIGH (ref 4.8–5.6)
Mean Plasma Glucose: 139.85 mg/dL

## 2022-04-19 MED ORDER — MOMETASONE FURO-FORMOTEROL FUM 200-5 MCG/ACT IN AERO
2.0000 | INHALATION_SPRAY | Freq: Two times a day (BID) | RESPIRATORY_TRACT | Status: DC
  Filled 2022-04-19: qty 8.8

## 2022-04-19 MED ORDER — ACETAMINOPHEN 650 MG RE SUPP: 650.0000 mg | RECTAL | Status: DC | PRN

## 2022-04-19 MED ORDER — TIOTROPIUM BROMIDE MONOHYDRATE 18 MCG IN CAPS: 18.0000 ug | ORAL_CAPSULE | Freq: Every day | RESPIRATORY_TRACT | Status: DC

## 2022-04-19 MED ORDER — IOHEXOL 350 MG/ML SOLN
50.0000 mL | Freq: Once | INTRAVENOUS | Status: AC | PRN
  Administered 2022-04-19: 50 mL via INTRAVENOUS

## 2022-04-19 MED ORDER — ACETAMINOPHEN 325 MG PO TABS: 650.0000 mg | ORAL_TABLET | ORAL | Status: DC | PRN

## 2022-04-19 MED ORDER — ENOXAPARIN SODIUM 40 MG/0.4ML IJ SOSY: 40.0000 mg | PREFILLED_SYRINGE | INTRAMUSCULAR | Status: DC

## 2022-04-19 MED ORDER — ACETAMINOPHEN-CODEINE 300-15 MG PO TABS: 1.0000 | ORAL_TABLET | Freq: Two times a day (BID) | ORAL | Status: DC | PRN

## 2022-04-19 MED ORDER — ASPIRIN 325 MG PO TBEC
325.0000 mg | DELAYED_RELEASE_TABLET | Freq: Every day | ORAL | Status: DC
  Administered 2022-04-19 – 2022-04-20 (×2): 325 mg via ORAL
  Filled 2022-04-19 (×2): qty 1

## 2022-04-19 MED ORDER — ACETAMINOPHEN 160 MG/5ML PO SOLN: 650.0000 mg | ORAL | Status: DC | PRN

## 2022-04-19 MED ORDER — ROSUVASTATIN CALCIUM 20 MG PO TABS: 40.0000 mg | ORAL_TABLET | Freq: Every day | ORAL | Status: DC

## 2022-04-19 MED ORDER — LORAZEPAM 2 MG/ML IJ SOLN
0.5000 mg | Freq: Once | INTRAMUSCULAR | Status: AC
  Administered 2022-04-19: 0.5 mg via INTRAVENOUS
  Filled 2022-04-19: qty 1

## 2022-04-19 MED ORDER — LACTATED RINGERS IV BOLUS
500.0000 mL | Freq: Once | INTRAVENOUS | Status: AC
  Administered 2022-04-19: 500 mL via INTRAVENOUS

## 2022-04-19 MED ORDER — STROKE: EARLY STAGES OF RECOVERY BOOK
Freq: Once | Status: AC
  Filled 2022-04-19: qty 1

## 2022-04-19 MED ORDER — ROSUVASTATIN CALCIUM 20 MG PO TABS
40.0000 mg | ORAL_TABLET | Freq: Every day | ORAL | Status: DC
  Administered 2022-04-20: 40 mg via ORAL
  Filled 2022-04-19: qty 2

## 2022-04-19 MED ORDER — UMECLIDINIUM BROMIDE 62.5 MCG/ACT IN AEPB
1.0000 | INHALATION_SPRAY | Freq: Every day | RESPIRATORY_TRACT | Status: DC
  Filled 2022-04-19: qty 7

## 2022-04-19 MED ORDER — ALBUTEROL SULFATE (2.5 MG/3ML) 0.083% IN NEBU: INHALATION_SOLUTION | Freq: Four times a day (QID) | RESPIRATORY_TRACT | Status: DC | PRN

## 2022-04-19 NOTE — Assessment & Plan Note (Addendum)
Resume home meds at discharge -Last echocardiogram on 05/2020 with EF of 30% with mid to apical inferior septal and anteroseptal hypokinesis.  Grade 1 diastolic dysfunction.  Monitor fluid status closely while receiving fluid for AKI\ - repeat echo with akinesis of distal anteroseptal wall, apex, and distal inferior wall, EF 25-30%, grade 1 diastolic dysfunction (see report)

## 2022-04-19 NOTE — Assessment & Plan Note (Addendum)
Has been evaluated by hematology in the past.  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  Hold on any heparin/LMWH DVT prophylaxis. -Per hematology in the past,  no contraindication to remain on antiplatelet agents or anticoagulants as long as the platelet is greater than 50,000.

## 2022-04-19 NOTE — Assessment & Plan Note (Addendum)
noted 

## 2022-04-19 NOTE — ED Provider Notes (Signed)
[facility] EMERGENCY DEPARTMENT Provider Note   CSN: [id number] Arrival date & time: 04/19/22  1813  An emergency department physician performed an initial assessment on this suspected stroke patient at 1857.  History  Chief Complaint  Patient presents with   check-up post dizziness   Dizziness   Aphasia    [name] is a 81 y.o. female.  Patient brought in by EMS.  At 1715 patient developed lightheadedness she said no real room spinning slurred speech visual changes all resolved within 30 minutes patient feels that she is back to normal now.  Patient with no history of prior stroke or TIA.  Past medical history though is significant for chronic systolic heart failure hyperlipidemia hypertension coronary disease ischemic cardiomyopathy.  And heparin-induced thrombocytopenia.  Past surgical history significant for coronary bypass graft surgery in 2009.  Patient is a former smoker quit smoking in 2009.  Patient is not on any blood thinners.  Patient denied any numbness or weakness to her face arms or legs.  Code stroke activated just because of the timing.  But symptoms have resolved.  Not a TNK candidate at this time.       Home Medications Prior to Admission medications   Medication Sig Start Date End Date Taking? Authorizing Provider  acetaminophen-codeine (TYLENOL #2) 300-15 MG tablet Take 1 tablet by mouth 2 (two) times daily as needed for moderate pain. 12/18/21   [provider]  albuterol (PROVENTIL HFA;VENTOLIN HFA) 108 (90 BASE) MCG/ACT inhaler Inhale 1-2 puffs into the lungs every 6 (six) hours as needed for wheezing. 01/08/11   Clance, Maree Krabbe, MD  albuterol (PROVENTIL) (5 MG/ML) 0.5% nebulizer solution Take 2.5 mg by nebulization every 2 (two) hours as needed for wheezing or shortness of breath.    [provider]  alendronate (FOSAMAX) 70 MG tablet Take 70 mg by mouth once a week. 10/31/21   [provider]  allopurinol  (ZYLOPRIM) 100 MG tablet Take 100 mg by mouth daily.    [provider]  aspirin 81 MG tablet Take 81 mg by mouth daily.    [provider]  BIDIL 20-37.5 MG tablet TAKE 1 TABLET BY MOUTH THREE TIMES DAILY Patient taking differently: Take 1 tablet by mouth 3 (three) times daily. 06/26/20   Tonny Bollman, MD  budesonide-formoterol [facility]) 160-4.5 MCG/ACT inhaler Inhale 2 puffs into the lungs 2 (two) times daily.    [provider]  carvedilol (COREG) 12.5 MG tablet Take 1 tablet (12.5 mg total) by mouth 2 (two) times daily. 03/18/22   Tonny Bollman, MD  colchicine 0.6 MG tablet Take 0.6 mg by mouth 2 (two) times daily as needed. For gout Patient not taking: Reported on 02/02/2022    [provider]  famotidine (PEPCID) 20 MG tablet Take 1 tablet (20 mg total) by mouth 2 (two) times daily. Take one tablet twice daily for two days Patient not taking: Reported on 02/02/2022 01/09/17   Gerhard Munch, MD  furosemide (LASIX) 40 MG tablet Take 1 tablet (40 mg total) by mouth daily. Please keep upcoming appt. In April with Dr. Excell Seltzer in order to receive future refills. Thank You. Patient taking differently: Take 40 mg by mouth daily. 11/10/21   Tonny Bollman, MD  ipratropium (ATROVENT) 0.02 % nebulizer solution Take 0.5 mg by nebulization 4 (four) times daily.    [provider]  Multiple Vitamin (MULTIVITAMIN) tablet Take 1 tablet by mouth daily.    [provider]  nitroGLYCERIN (  NITROSTAT) 0.4 MG SL tablet PLACE 1 TABLET UNDER TONGUE EVERY 5 MINUTES AS NEEDED FOR CHEST PAIN Patient taking differently: Place 0.4 mg under the tongue every 5 (five) minutes as needed for chest pain. 07/14/16   Tonny Bollman, MD  Omega 3 1000 MG CAPS Take 1 capsule by mouth daily.    [provider]  potassium chloride SA (K-DUR) 20 MEQ tablet Take 20 mEq by mouth daily.    [provider]  rosuvastatin (CRESTOR) 40 MG tablet Take 1 tablet (40 mg  total) by mouth daily. 01/07/22   Tonny Bollman, MD  sacubitril-valsartan (ENTRESTO) 49-51 MG Take 1 tablet by mouth 2 (two) times daily. 02/02/22   Tonny Bollman, MD  spironolactone (ALDACTONE) 25 MG tablet TAKE 1 TABLET(25 MG) BY MOUTH DAILY Patient taking differently: Take 25 mg by mouth daily. 01/07/22   Tonny Bollman, MD  tiotropium (SPIRIVA) 18 MCG inhalation capsule Place 18 mcg into inhaler and inhale daily.    [provider]  traMADol (ULTRAM) 50 MG tablet Take 50 mg by mouth every 6 (six) hours as needed for moderate pain. 11/14/19   [provider]  Turmeric (QC TUMERIC COMPLEX) 500 MG CAPS Take 1 capsule by mouth daily. Patient taking 400 mg daily    [provider]      Allergies    Patient has no known allergies.    Review of Systems   Review of Systems  Constitutional:  Negative for chills and fever.  HENT:  Negative for ear pain and sore throat.   Eyes:  Positive for visual disturbance. Negative for pain.  Respiratory:  Negative for cough and shortness of breath.   Cardiovascular:  Negative for chest pain and palpitations.  Gastrointestinal:  Negative for abdominal pain and vomiting.  Genitourinary:  Negative for dysuria and hematuria.  Musculoskeletal:  Negative for arthralgias and back pain.  Skin:  Negative for color change and rash.  Neurological:  Positive for speech difficulty and light-headedness. Negative for seizures, syncope, weakness and numbness.  All other systems reviewed and are negative.   Physical Exam Updated Vital Signs BP 116/61   Pulse 67   Temp 98 F (36.7 C) (Oral)   Resp 19   Ht 1.651 m (5\' 5" )   Wt 85.1 kg   SpO2 96%   BMI 31.22 kg/m  Physical Exam Vitals and nursing note reviewed.  Constitutional:      General: She is not in acute distress.    Appearance: Normal appearance. She is well-developed.  HENT:     Head: Normocephalic and atraumatic.  Eyes:     Extraocular Movements: Extraocular movements  intact.     Conjunctiva/sclera: Conjunctivae normal.     Pupils: Pupils are equal, round, and reactive to light.  Cardiovascular:     Rate and Rhythm: Normal rate and regular rhythm.     Heart sounds: No murmur heard. Pulmonary:     Effort: Pulmonary effort is normal. No respiratory distress.     Breath sounds: Normal breath sounds.  Abdominal:     Palpations: Abdomen is soft.     Tenderness: There is no abdominal tenderness.  Musculoskeletal:        General: No swelling.     Cervical back: Neck supple.  Skin:    General: Skin is warm and dry.     Capillary Refill: Capillary refill takes less than 2 seconds.  Neurological:     General: No focal deficit present.     Mental Status:  She is alert and oriented to person, place, and time.     Cranial Nerves: No cranial nerve deficit.     Sensory: No sensory deficit.     Motor: No weakness.     Coordination: Coordination normal.     Comments: No neurodeficits at this time.  Psychiatric:        Mood and Affect: Mood normal.     ED Results / Procedures / Treatments   Labs (all labs ordered are listed, but only abnormal results are displayed) Labs Reviewed  CBC - Abnormal; Notable for the following components:      Result Value   WBC 11.1 (*)    RBC 5.43 (*)    Hemoglobin 15.4 (*)    HCT 48.8 (*)    Platelets 96 (*)    All other components within normal limits  DIFFERENTIAL - Abnormal; Notable for the following components:   Neutro Abs 8.3 (*)    All other components within normal limits  COMPREHENSIVE METABOLIC PANEL - Abnormal; Notable for the following components:   Glucose, Bld 107 (*)    Creatinine, Ser 1.17 (*)    Calcium 8.8 (*)    GFR, Estimated 46 (*)    All other components within normal limits  CBG MONITORING, ED - Abnormal; Notable for the following components:   Glucose-Capillary 117 (*)    All other components within normal limits  I-STAT CHEM 8, ED - Abnormal; Notable for the following components:    Potassium 6.8 (*)    Creatinine, Ser 1.20 (*)    Glucose, Bld 101 (*)    Calcium, Ion 1.02 (*)    Hemoglobin 16.7 (*)    HCT 49.0 (*)    All other components within normal limits  RESP PANEL BY RT-PCR (FLU A&B, COVID) ARPGX2  PROTIME-INR  APTT  URINALYSIS, ROUTINE W REFLEX MICROSCOPIC  ETHANOL  RAPID URINE DRUG SCREEN, HOSP PERFORMED    EKG None  Radiology CT HEAD CODE STROKE WO CONTRAST  Result Date: 04/19/2022 CLINICAL DATA:  Code stroke. EXAM: CT HEAD WITHOUT CONTRAST TECHNIQUE: Contiguous axial images were obtained from the base of the skull through the vertex without intravenous contrast. RADIATION DOSE REDUCTION: This exam was performed according to the departmental dose-optimization program which includes automated exposure control, adjustment of the mA and/or kV according to patient size and/or use of iterative reconstruction technique. COMPARISON:  Prior CT from 07/08/2004. FINDINGS: Brain: Generalized age-related cerebral atrophy. Patchy hypodensity involving the supratentorial cerebral white matter most likely related chronic microvascular ischemic disease, moderate to advanced in nature. No acute intracranial hemorrhage. No acute large vessel territory infarct. No mass lesion, mass effect or midline shift. No hydrocephalus or extra-axial fluid collection. Vascular: No hyperdense vessel. Calcified atherosclerosis present at skull base. Skull: Scalp soft tissues and calvarium within normal limits. Sinuses/Orbits: Globes and orbital soft tissues demonstrate no acute finding. Paranasal sinuses are largely clear. No mastoid effusion. Other: None. ASPECTS [facility] Stroke Program Early CT Score) - Ganglionic level infarction (caudate, lentiform nuclei, internal capsule, insula, M1-M3 cortex): 7 - Supraganglionic infarction (M4-M6 cortex): 3 Total score (0-10 with 10 being normal): 10 IMPRESSION: 1. No acute intracranial abnormality. 2. ASPECTS is 10. 3. Age-related cerebral atrophy with  moderate to advanced chronic small vessel ischemic disease. These results were communicated to Dr. Wilford Corner at 7:18 pm on 04/19/2022 by text page via the [facility] messaging system. Electronically Signed   By: Rise Mu M.D.   On: 04/19/2022 19:20    Procedures Procedures  Medications Ordered in ED Medications  iohexol (OMNIPAQUE) 350 MG/ML injection 50 mL (50 mLs Intravenous Contrast Given 04/19/22 1923)    ED Course/ Medical Decision Making/ A&P                           Medical Decision Making Amount and/or Complexity of Data Reviewed Labs: ordered. Radiology: ordered.  Risk Decision regarding hospitalization.   CRITICAL CARE Performed by: Vanetta Mulders Total critical care time: 60 minutes Critical care time was exclusive of separately billable procedures and treating other patients. Critical care was necessary to treat or prevent imminent or life-threatening deterioration. Critical care was time spent personally by me on the following activities: development of treatment plan with patient and/or surrogate as well as nursing, discussions with consultants, evaluation of patient's response to treatment, examination of patient, obtaining history from patient or surrogate, ordering and performing treatments and interventions, ordering and review of laboratory studies, ordering and review of radiographic studies, pulse oximetry and re-evaluation of patient's condition.  Patient's NIH score is 0.  No definite neurodeficits at this time.  But certainly had symptoms concerning for TIA lasted for 30 minutes.  Sounds like a mini stroke.  Code stroke activated.  The patient not a candidate for intervention.  Head CT without any acute findings.  Neurology ordered CT angios which are pending.  Patient's labs without any significant abnormalities.  I-STAT had a potassium of 6.8 but then her complete metabolic panel had a normal potassium of 3.8.  Renal function creatinine 1.17 GFR 46.  Urine  drug screen pending urinalysis negative.  Blood sugar was 117.  CT angio still pending.  EKG without any acute findings.  However did show borderline prolonged PR and ventricular preexcitation.  Neurology recommended medicine admission.  Patient is remained stable no recurrent symptoms. Final Clinical Impression(s) / ED Diagnoses Final diagnoses:  TIA (transient ischemic attack)    Rx / DC Orders ED Discharge Orders     None         Vanetta Mulders, MD 04/19/22 2020

## 2022-04-19 NOTE — Assessment & Plan Note (Addendum)
S/p CABG Continue PTA aspirin, statin, coreg, spironolactone, entresto

## 2022-04-19 NOTE — Assessment & Plan Note (Signed)
Continue rosuvastatin.  

## 2022-04-19 NOTE — Consult Note (Signed)
Neurology Consultation  Reason for Consult: code stroke for transient neurological symptoms Referring Physician: Dr Deretha Emory  CC: dizziness, blurred vision, slurred speech  History is obtained from: patient, chart  HPI: [name] is a 81 y.o. female PMH of CAD, HTN, HLD, ischemic cardiomyopathy, chronic systolic heart failure, emphysema, presented to the emergency room for evaluation of a brief episode of blurred vision, dizziness and slurred speech that lasted about 15 to 20 minutes and then completely resolved.  Code stroke was activated in the ER due to sudden onset of the symptoms although symptoms had resolved even before EMS got to their house and hence no fetal activation. Patient reports no similar symptoms in the past. Reports that she was trying to get up and felt dizzy-lightheaded and then had a few minutes of blurred vision.  Her speech also sounded slurred.  EMS was called for this reason but by the time they arrived, symptoms are resolved. She has multiple cerebrovascular factors. She has been evaluated as an acute code stroke with an NIH stroke scale of 0 hence no IV thrombolysis or stroke intervention.   LKW: 1715 hrs IV thrombolysis given?: no, NIHSS 0 Premorbid modified Rankin scale (mRS): 2   ROS: Full ROS was performed and is negative except as noted in the HPI   Past Medical History:  Diagnosis Date   CAD (coronary artery disease)    s/p NSTEMI 2009 - s/p CABG 2009: L-LAD, S-OM1 and OM2, S-Dx, S-PDA.;  Myoview 11/04/11:  Small inf scar, mod ant apical and septal scar, no ischemia, EF 31%.// Myoview 10/2018:  EF 46, ant-sept and apical infarct; no ischemia, apical AK; Low Risk    Cardiomyopathy, ischemic    a.  LVEF 35-40% // b. Echo 5/14: Moderate LVH, EF 30-35%, mid to distal ant-septal, apical HK // c. Echo  9/14: Mild focal basal septal hypertrophy, EF 35-40%, anteroseptal and apical HK, grade 2 diastolic dysfunction, mild MR, reduced RV systolic function,  moderate TR, PASP 47.       Chronic systolic heart failure (HCC)    HIT (heparin-induced thrombocytopenia) (HCC)    HLD (hyperlipidemia)    HTN (hypertension)    Other emphysema (HCC)     Family History  Problem Relation Age of Onset   Asthma Mother    Hypertension Mother    Diabetes Mother    Rheum arthritis Mother     Social History:   reports that she quit smoking about 14 years ago. Her smoking use included cigarettes. She has a 44.00 pack-year smoking history. She has never used smokeless tobacco. She reports that she does not drink alcohol and does not use drugs.  Medications No current facility-administered medications for this encounter.  Current Outpatient Medications:    acetaminophen-codeine (TYLENOL #2) 300-15 MG tablet, Take 1 tablet by mouth 2 (two) times daily as needed., Disp: , Rfl:    albuterol (PROVENTIL HFA;VENTOLIN HFA) 108 (90 BASE) MCG/ACT inhaler, 1-2 puffs every 6 (six) hours as needed (wheezing). 1-2 puffs every 4-6 hours as needed, Disp: , Rfl:    albuterol (PROVENTIL) (5 MG/ML) 0.5% nebulizer solution, Take 2.5 mg by nebulization every 2 (two) hours as needed for wheezing or shortness of breath., Disp: , Rfl:    alendronate (FOSAMAX) 70 MG tablet, Take 70 mg by mouth once a week., Disp: , Rfl:    allopurinol (ZYLOPRIM) 100 MG tablet, Take 100 mg by mouth daily., Disp: , Rfl:    aspirin 81 MG tablet, Take 81 mg by  mouth daily., Disp: , Rfl:    BIDIL 20-37.5 MG tablet, TAKE 1 TABLET BY MOUTH THREE TIMES DAILY, Disp: 270 tablet, Rfl: 3   budesonide-formoterol (SYMBICORT) 160-4.5 MCG/ACT inhaler, Inhale 2 puffs into the lungs 2 (two) times daily., Disp: , Rfl:    carvedilol (COREG) 12.5 MG tablet, Take 1 tablet (12.5 mg total) by mouth 2 (two) times daily., Disp: 180 tablet, Rfl: 3   colchicine 0.6 MG tablet, Take 0.6 mg by mouth 2 (two) times daily as needed. For gout (Patient not taking: Reported on 02/02/2022), Disp: , Rfl:    famotidine (PEPCID) 20 MG  tablet, Take 1 tablet (20 mg total) by mouth 2 (two) times daily. Take one tablet twice daily for two days (Patient not taking: Reported on 02/02/2022), Disp: 10 tablet, Rfl: 0   furosemide (LASIX) 40 MG tablet, Take 1 tablet (40 mg total) by mouth daily. Please keep upcoming appt. In April with Dr. Excell Seltzer in order to receive future refills. Thank You., Disp: 45 tablet, Rfl: 0   ipratropium (ATROVENT) 0.02 % nebulizer solution, Take 0.5 mg by nebulization 4 (four) times daily., Disp: , Rfl:    Multiple Vitamin (MULTIVITAMIN) tablet, Take 1 tablet by mouth daily., Disp: , Rfl:    nitroGLYCERIN (NITROSTAT) 0.4 MG SL tablet, PLACE 1 TABLET UNDER TONGUE EVERY 5 MINUTES AS NEEDED FOR CHEST PAIN, Disp: 75 tablet, Rfl: 0   Omega 3 1000 MG CAPS, Take by mouth daily., Disp: , Rfl:    potassium chloride SA (K-DUR) 20 MEQ tablet, Take 20 mEq by mouth daily., Disp: , Rfl:    rosuvastatin (CRESTOR) 40 MG tablet, Take 1 tablet (40 mg total) by mouth daily., Disp: 30 tablet, Rfl: 0   sacubitril-valsartan (ENTRESTO) 49-51 MG, Take 1 tablet by mouth 2 (two) times daily., Disp: 180 tablet, Rfl: 3   spironolactone (ALDACTONE) 25 MG tablet, TAKE 1 TABLET(25 MG) BY MOUTH DAILY, Disp: 30 tablet, Rfl: 0   tiotropium (SPIRIVA) 18 MCG inhalation capsule, Place 18 mcg into inhaler and inhale daily., Disp: , Rfl:    traMADol (ULTRAM) 50 MG tablet, Take 50 mg by mouth every 6 (six) hours as needed., Disp: , Rfl:    Turmeric (QC TUMERIC COMPLEX) 500 MG CAPS, Take by mouth. Patient taking 400 mg daily, Disp: , Rfl:   Exam: Current vital signs: BP 112/64   Pulse 72   Temp 98 F (36.7 C) (Oral)   Resp 20   Ht 5\' 5"  (1.651 m)   Wt 85.1 kg   SpO2 97%   BMI 31.22 kg/m  Vital signs in last 24 hours: Temp:  [98 F (36.7 C)] 98 F (36.7 C) (07/02 1830) Pulse Rate:  [70-72] 72 (07/02 1930) Resp:  [18-21] 20 (07/02 1930) BP: (107-128)/(64-72) 112/64 (07/02 1930) SpO2:  [96 %-97 %] 97 % (07/02 1930) Weight:  [85.1 kg] 85.1  kg (07/02 1832)  GENERAL: Awake, alert in NAD HEENT: - Normocephalic and atraumatic, dry mm, no LN++, no Thyromegally LUNGS - Clear to auscultation bilaterally with no wheezes CV - S1S2 RRR, no m/r/g, equal pulses bilaterally. ABDOMEN - Soft, nontender, nondistended with normoactive BS Ext: warm, well perfused, intact peripheral pulses, no  edema  NEURO:  Mental Status: AA&Ox3  Language: speech is normal.  Naming, repetition, fluency, and comprehension intact. Cranial Nerves: PERRL EOMI, visual fields full, no facial asymmetry, facial sensation intact, hearing intact, tongue/uvula/soft palate midline, normal sternocleidomastoid and trapezius muscle strength. No evidence of tongue atrophy or fibrillations Motor: 5/5 all  over without vertical drift Tone: is normal and bulk is normal Sensation- Intact to light touch bilaterally Coordination: FTN intact bilaterally, no ataxia in BLE. Gait- deferred  NIHSS--0  Labs I have reviewed labs in epic and the results pertinent to this consultation are:  CBC    Component Value Date/Time   WBC 11.1 (H) 04/19/2022 1850   RBC 5.43 (H) 04/19/2022 1850   HGB 16.7 (H) 04/19/2022 1900   HGB 15.0 02/02/2022 1047   HCT 49.0 (H) 04/19/2022 1900   HCT 46.1 02/02/2022 1047   PLT 96 (L) 04/19/2022 1850   PLT 94 (LL) 02/02/2022 1047   MCV 89.9 04/19/2022 1850   MCV 83 02/02/2022 1047   MCH 28.4 04/19/2022 1850   MCHC 31.6 04/19/2022 1850   RDW 14.5 04/19/2022 1850   RDW 13.0 02/02/2022 1047   LYMPHSABS 1.8 04/19/2022 1850   MONOABS 0.8 04/19/2022 1850   EOSABS 0.1 04/19/2022 1850   BASOSABS 0.1 04/19/2022 1850    CMP     Component Value Date/Time   NA 140 04/19/2022 1900   NA 143 02/02/2022 1047   K 6.8 (HH) 04/19/2022 1900   CL 104 04/19/2022 1900   CO2 27 04/19/2022 1850   GLUCOSE 101 (H) 04/19/2022 1900   BUN 19 04/19/2022 1900   BUN 12 02/02/2022 1047   CREATININE 1.20 (H) 04/19/2022 1900   CREATININE 1.12 (H) 07/13/2016 1306    CALCIUM 8.8 (L) 04/19/2022 1850   PROT 7.1 04/19/2022 1850   PROT 6.6 02/02/2022 1047   ALBUMIN 3.6 04/19/2022 1850   ALBUMIN 3.9 02/02/2022 1047   AST 15 04/19/2022 1850   ALT 8 04/19/2022 1850   ALKPHOS 74 04/19/2022 1850   BILITOT 1.1 04/19/2022 1850   BILITOT 0.4 02/02/2022 1047   GFRNONAA 46 (L) 04/19/2022 1850   GFRAA 44 (L) 06/14/2019 1250    Imaging I have reviewed the images obtained:  CT-head-ASPECTS 10, no bleed CTA head/neck negative for LVO  Assessment:  81 year old with multiple cerebrovascular risk factors presenting for transient dizziness, blurred vision and slurred speech concerning for posterior circulation TIA versus small infarct. NIH stroke scale 0 at the time of exam precludes IV thrombolysis and EVT. Would benefit from stroke/TIA work-up. Other differentials could include neurological manifestations of toxic metabolic derangement such as UTI or pneumonia-checking UA and chest x-ray would also be helpful as she has mild leukocytosis.  Impression Evaluate for posterior circulation TIA Evaluate for UTI or pneumonia  Recommendations: -Admit to hospitalist for  obs -Telemetry monitoring -Allow for permissive hypertension for the first 24-48h - only treat PRN if SBP >220 mmHg. Blood pressures can be gradually normalized to SBP<140 upon discharge. -MRI brain without contrast -Echocardiogram -HgbA1c, fasting lipid panel -Frequent neuro checks -Prophylactic therapy-Antiplatelet med: Aspirin - dose 325mg  PO or 300mg  PR -Atorvastatin 80 mg PO daily -Risk factor modification -PT consult, OT consult, Speech consult -Check orthostatics -UA, CXR  Plan discussed with Dr. Rogene Houston  Please page stroke NP/PA/MD (listed on AMION)  from 8am-4 pm as this patient will be followed by the stroke team at this point.   -- Amie Portland, MD Neurologist [facility] Pager: [phone]

## 2022-04-19 NOTE — Code Documentation (Signed)
Stroke Response Nurse Documentation Code Documentation  [name] is a 81 y.o. female arriving to [facility]  via [facility] on 04/19/2022 with past medical hx of CAD, HLD, NT. On aspirin 81 mg daily. Code stroke was activated by ED.   Patient from home where she was LKW at 1715 when she had sudden onset of slurred speech, blurred vision, and dizziness that resolved within 30 minutes. When EMS arrived she was back to her baseline.   Stroke team at the bedside on patient arrival. Labs drawn and patient cleared for CT by Dr. Deretha Emory. Patient to CT with team. NIHSS 1, see documentation for details and code stroke times. Patient with left decreased sensation on exam related to chronic sciatic nerve.   The following imaging was completed:  CT Head and CTA. Patient is not a candidate for IV Thrombolytic due to symptoms resolved. Patient is not a candidate for IR due to LVO negative.   Care Plan: TIA work-up q2h NIHSS and VS  Bedside handoff with ED RN Zollie Scale.    Ludie Pavlik L Kaylla Cobos  Rapid Response RN

## 2022-04-19 NOTE — ED Triage Notes (Signed)
Pt arrived by EMS. Approx 1715, pt developed dizziness at home and slurred speech that resolved within 30 minutes. No neuro deficits at this time. No hx stroke/TIA

## 2022-04-19 NOTE — H&P (Addendum)
History and Physical    Patient: [name] DOB: 12/28/1940 DOA: 04/19/2022 DOS: the patient was seen and examined on 04/20/2022 PCP: System, Provider Not In  Patient coming from: Home  Chief Complaint:  Chief Complaint  Patient presents with   check-up post dizziness   Dizziness   Aphasia   HPI: [name] is a 81 y.o. female with medical history significant of CAD s/p CABG, PVCs, chronic systolic heart failure, hypertension, hyperlipidemia, chronic thrombocytopenia who presents with concerns of slurred speech and dizziness.  Patient was sitting talking with one of her grandchild earlier today around 1700.  Suddenly she had slurred speech and trouble word finding.  She also felt dizzy and had blurry vision.  This lasted about 10 minutes and resolved after EMS arrived.  Denies any chest pain or palpitations.  She denies any extremity weakness.  Has chronic left lower extremity weakness due to sciatica.  Patient has not been taking her aspirin for CAD but has been taking her statin.  Patient presented to as a code stroke in the ED.  She was afebrile and normotensive on room air.  Had mild leukocytosis of 11.1, hemoglobin of 15.4.  Stable chronic thrombocytopenia however platelet of 96.  Has AKI with creatinine of 1.17 up from 0.94.  CT head was negative.  CTA head and neck was negative for large vessel occlusion.  Not a candidate for IV thrombolytics due to resolution of symptoms. She was evaluated by neurology and recommended for medicine admission for full stroke/TIA work-up. Review of Systems: As mentioned in the history of present illness. All other systems reviewed and are negative. Past Medical History:  Diagnosis Date   CAD (coronary artery disease)    s/p NSTEMI 2009 - s/p CABG 2009: L-LAD, S-OM1 and OM2, S-Dx, S-PDA.;  Myoview 11/04/11:  Small inf scar, mod ant apical and septal scar, no ischemia, EF 31%.// Myoview 10/2018:  EF 46, ant-sept and apical infarct; no  ischemia, apical AK; Low Risk    Cardiomyopathy, ischemic    a.  LVEF 35-40% // b. Echo 5/14: Moderate LVH, EF 30-35%, mid to distal ant-septal, apical HK // c. Echo  9/14: Mild focal basal septal hypertrophy, EF 35-40%, anteroseptal and apical HK, grade 2 diastolic dysfunction, mild MR, reduced RV systolic function, moderate TR, PASP 47.       Chronic systolic heart failure (HCC)    HIT (heparin-induced thrombocytopenia) (HCC)    HLD (hyperlipidemia)    HTN (hypertension)    Other emphysema (HCC)    Past Surgical History:  Procedure Laterality Date   CORONARY ARTERY BYPASS GRAFT  November 21, 2007   5 vessel   Social History:  reports that she quit smoking about 14 years ago. Her smoking use included cigarettes. She has a 44.00 pack-year smoking history. She has never used smokeless tobacco. She reports that she does not drink alcohol and does not use drugs.  No Known Allergies  Family History  Problem Relation Age of Onset   Asthma Mother    Hypertension Mother    Diabetes Mother    Rheum arthritis Mother     Prior to Admission medications   Medication Sig Start Date End Date Taking? Authorizing Provider  acetaminophen-codeine (TYLENOL #2) 300-15 MG tablet Take 1 tablet by mouth 2 (two) times daily as needed for moderate pain. 12/18/21  Yes [provider]  albuterol (PROVENTIL HFA;VENTOLIN HFA) 108 (90 BASE) MCG/ACT inhaler Inhale 1-2 puffs into the lungs every 6 (six)  hours as needed for wheezing. 01/08/11  Yes Clance, Maree Krabbe, MD  BIDIL 20-37.5 MG tablet TAKE 1 TABLET BY MOUTH THREE TIMES DAILY Patient taking differently: Take 1 tablet by mouth 3 (three) times daily. 06/26/20  Yes Tonny Bollman, MD  budesonide-formoterol [facility]) 160-4.5 MCG/ACT inhaler Inhale 2 puffs into the lungs 2 (two) times daily.   Yes [provider]  carvedilol (COREG) 12.5 MG tablet Take 1 tablet (12.5 mg total) by mouth 2 (two) times daily. 03/18/22  Yes Tonny Bollman, MD   furosemide (LASIX) 40 MG tablet Take 1 tablet (40 mg total) by mouth daily. Please keep upcoming appt. In April with Dr. Excell Seltzer in order to receive future refills. Thank You. Patient taking differently: Take 40 mg by mouth daily. 11/10/21  Yes Tonny Bollman, MD  Multiple Vitamin (MULTIVITAMIN) tablet Take 1 tablet by mouth daily.   Yes [provider]  nitroGLYCERIN (NITROSTAT) 0.4 MG SL tablet PLACE 1 TABLET UNDER TONGUE EVERY 5 MINUTES AS NEEDED FOR CHEST PAIN Patient taking differently: Place 0.4 mg under the tongue every 5 (five) minutes as needed for chest pain. 07/14/16  Yes Tonny Bollman, MD  Omega 3 1000 MG CAPS Take 1 capsule by mouth daily.   Yes [provider]  rosuvastatin (CRESTOR) 40 MG tablet Take 1 tablet (40 mg total) by mouth daily. 01/07/22  Yes Tonny Bollman, MD  sacubitril-valsartan (ENTRESTO) 49-51 MG Take 1 tablet by mouth 2 (two) times daily. 02/02/22  Yes Tonny Bollman, MD  spironolactone (ALDACTONE) 25 MG tablet TAKE 1 TABLET(25 MG) BY MOUTH DAILY Patient taking differently: Take 25 mg by mouth daily. 01/07/22  Yes Tonny Bollman, MD  tiotropium (SPIRIVA) 18 MCG inhalation capsule Place 18 mcg into inhaler and inhale daily.   Yes [provider]  Turmeric (QC TUMERIC COMPLEX) 500 MG CAPS Take 1 capsule by mouth daily. Patient taking 400 mg daily   Yes [provider]  famotidine (PEPCID) 20 MG tablet Take 1 tablet (20 mg total) by mouth 2 (two) times daily. Take one tablet twice daily for two days Patient not taking: Reported on 02/02/2022 01/09/17   Gerhard Munch, MD    Physical Exam: Vitals:   04/19/22 2000 04/19/22 2015 04/19/22 2030 04/19/22 2345  BP: 116/61 127/68 119/65 119/63  Pulse: 67 71 66 69  Resp: 19 15 16 11   Temp:      TempSrc:      SpO2: 96% 96% 96% 96%  Weight:      Height:       Constitutional: NAD, calm, comfortable, obese female laying in approximately 20 degree incline in bed Eyes: PERRL, lids and  conjunctivae normal ENMT: Mucous membranes are moist.  Neck: normal, supple Respiratory: clear to auscultation bilaterally, no wheezing, no crackles. Normal respiratory effort. No accessory muscle use.  Cardiovascular: Regular rate and rhythm, no murmurs / rubs / gallops. No extremity edema. 2+ pedal pulses.  Abdomen: no tenderness, Bowel sounds positive.  Musculoskeletal: no clubbing / cyanosis. No joint deformity upper and lower extremities. Good ROM, no contractures. Normal muscle tone.  Skin: no rashes, lesions, ulcers. No induration Neurologic: CN 2-12 grossly intact. Sensation intact. Strength 4/5 in all left lower extremity.  Intact finger-nose.  Intact heel-to-shin. Psychiatric: Normal judgment and insight. Alert and oriented x 3. Normal mood. Data Reviewed:  See HPI  Assessment and Plan: * Slurred speech CT head and CTA head and neck so far negative. UA negative. - MRI brain without contrast - echocardiogram  - start  daily aspirin 325 mg and home 40 mg of rosuvastatin -Obtain A1c and lipids -PT/OT/SLT -Frequent neuro checks and keep on telemetry -Allow for permissive hypertension with blood pressure treatment as needed only if systolic goes above 220.  Okay to normalize blood pressure to SBP of <140 at discharge. -Neurology is following  Hyperkalemia Initially presented with K of 3.8 with repeat lab of 6.8? Suspect this is inaccurate. Will check repeat before intervention.   Obesity (BMI 30-39.9) BMI of 31  AKI (acute kidney injury) (HCC) Creatinine elevated to 1.17 up from 0.94.  Give 500 cc LR bolus.  Thrombocytopenia (HCC) Has been evaluated by hematology in the past.  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  Hold on any heparin/LMWH DVT prophylaxis. -Per hematology in the past,  no contraindication to remain on antiplatelet agents or anticoagulants as long as the platelet is greater than 50,000.    COPD (chronic obstructive pulmonary disease) (HCC) Stable.   Continue home bronchodilator.  Chronic systolic heart failure (HCC) Holding home Lasix, Coreg and Entresto to allow for permissive hypertension due to AKI -Last echocardiogram on 05/2020 with EF of 30% with mid to apical inferior septal and anteroseptal hypokinesis.  Grade 1 diastolic dysfunction.  Monitor fluid status closely while receiving fluid for AKI  Coronary artery disease involving native coronary artery of native heart without angina pectoris S/p CABG Asymptomatic.  Continue aspirin.  Essential hypertension Allow for permissive hypertension for the first 24-48h - only treat PRN if SBP >220 mmHg. Blood pressures can be gradually normalized to SBP<140 upon discharge per neurology.  Hyperlipidemia Continue rosuvastatin      Advance Care Planning:   Code Status: Full Code   Consults: Neurology  Family Communication: Discussed with 3 granddaughters at bedside  Severity of Illness: The appropriate patient status for this patient is OBSERVATION. Observation status is judged to be reasonable and necessary in order to provide the required intensity of service to ensure the patient's safety. The patient's presenting symptoms, physical exam findings, and initial radiographic and laboratory data in the context of their medical condition is felt to place them at decreased risk for further clinical deterioration. Furthermore, it is anticipated that the patient will be medically stable for discharge from the hospital within 2 midnights of admission.   Author: Anselm Jungling, DO 04/20/2022 1:00 AM  For on call review [email or web address].

## 2022-04-19 NOTE — Assessment & Plan Note (Addendum)
Looks like this maybe her baseline? CKD IIIb? Follow outpatient

## 2022-04-19 NOTE — Assessment & Plan Note (Signed)
Stable. -Continue Pulmicort 

## 2022-04-19 NOTE — Assessment & Plan Note (Deleted)
CT head and CTA head and neck so far negative. UA negative. - MRI brain without contrast - echocardiogram  - start daily aspirin 325 mg and home 40 mg of rosuvastatin -Obtain A1c and lipids -PT/OT/SLT -Frequent neuro checks and keep on telemetry -Allow for permissive hypertension with blood pressure treatment as needed only if systolic goes above 220.  Okay to normalize blood pressure to SBP of <140 at discharge. -Neurology is following

## 2022-04-19 NOTE — Assessment & Plan Note (Addendum)
Resume home meds at discharge 

## 2022-04-20 ENCOUNTER — Observation Stay (HOSPITAL_BASED_OUTPATIENT_CLINIC_OR_DEPARTMENT_OTHER): Payer: Medicare Other

## 2022-04-20 DIAGNOSIS — E875 Hyperkalemia: Secondary | ICD-10-CM

## 2022-04-20 DIAGNOSIS — G459 Transient cerebral ischemic attack, unspecified: Secondary | ICD-10-CM

## 2022-04-20 DIAGNOSIS — R930 Abnormal findings on diagnostic imaging of skull and head, not elsewhere classified: Secondary | ICD-10-CM

## 2022-04-20 DIAGNOSIS — R2990 Unspecified symptoms and signs involving the nervous system: Secondary | ICD-10-CM

## 2022-04-20 DIAGNOSIS — R4781 Slurred speech: Secondary | ICD-10-CM | POA: Diagnosis not present

## 2022-04-20 DIAGNOSIS — N1832 Chronic kidney disease, stage 3b: Secondary | ICD-10-CM | POA: Insufficient documentation

## 2022-04-20 LAB — LIPID PANEL
Cholesterol: 88 mg/dL (ref 0–200)
HDL: 35 mg/dL — ABNORMAL LOW (ref >40)
LDL Cholesterol: 40 mg/dL (ref 0–99)
Total CHOL/HDL Ratio: 2.5 RATIO
Triglycerides: 63 mg/dL (ref <150)
VLDL: 13 mg/dL (ref 0–40)

## 2022-04-20 LAB — ECHOCARDIOGRAM COMPLETE
AR max vel: 1.8 cm2
AV Area VTI: 1.68 cm2
AV Area mean vel: 1.33 cm2
AV Mean grad: 5 mmHg
AV Peak grad: 7.6 mmHg
Ao pk vel: 1.38 m/s
Area-P 1/2: 6.77 cm2
Calc EF: 13.4 %
Height: 65 in
S' Lateral: 4.35 cm
Single Plane A2C EF: 11.6 %
Single Plane A4C EF: 18 %
Weight: 3001.78 oz

## 2022-04-20 LAB — POTASSIUM: Potassium: 3.5 mmol/L (ref 3.5–5.1)

## 2022-04-20 LAB — ETHANOL: Alcohol, Ethyl (B): <10 mg/dL (ref <10)

## 2022-04-20 MED ORDER — ASPIRIN 81 MG PO CHEW: 81.0000 mg | CHEWABLE_TABLET | Freq: Every day | ORAL | 11 refills | Status: AC

## 2022-04-20 MED ORDER — CLOPIDOGREL BISULFATE 75 MG PO TABS
75.0000 mg | ORAL_TABLET | Freq: Every day | ORAL | Status: DC
  Administered 2022-04-20: 75 mg via ORAL
  Filled 2022-04-20: qty 1

## 2022-04-20 MED ORDER — CLOPIDOGREL BISULFATE 75 MG PO TABS: 75.0000 mg | ORAL_TABLET | Freq: Every day | ORAL | 0 refills | Status: AC

## 2022-04-20 MED ORDER — ASPIRIN 81 MG PO CHEW: 81.0000 mg | CHEWABLE_TABLET | Freq: Every day | ORAL | Status: DC

## 2022-04-20 NOTE — Assessment & Plan Note (Signed)
Baseline looks like it's around 1.2? Follow outpatient

## 2022-04-20 NOTE — Progress Notes (Signed)
Echocardiogram 2D Echocardiogram has been performed.  [name] 04/20/2022, 9:25 AM

## 2022-04-20 NOTE — ED Notes (Signed)
Patient provided with full linen change.

## 2022-04-20 NOTE — Assessment & Plan Note (Signed)
1.1 T2 hypointense lesion within L parotid gland Needs outpatient non emergent ENT referral

## 2022-04-20 NOTE — Discharge Summary (Signed)
Physician Discharge Summary  [name] WJX:[id number] DOB: 1941/09/18 DOA: 04/19/2022  PCP: System, Provider Not In  Admit date: 04/19/2022 Discharge date: 04/20/2022  Time spent: 40 minutes  Recommendations for Outpatient Follow-up:  Follow outpatient CBC/CMP  Follow TIA outpatient with neurology DAPT x3 weeks, then aspirin alone Follow diabetes outpatient Follow parotid gland abnormality outpatient, follow with ENT Follow echo outpatient with cardiology   Discharge Diagnoses:  Principal Problem:   Slurred speech Active Problems:   Stroke-like symptoms   Chronic systolic heart failure (HCC)   Coronary artery disease involving native coronary artery of native heart without angina pectoris   Hyperlipidemia   Essential hypertension   COPD (chronic obstructive pulmonary disease) (HCC)   AKI (acute kidney injury) (HCC)   Hyperkalemia   Thrombocytopenia (HCC)   Obesity (BMI 30-39.9)   Abnormal MRI of head   Discharge Condition: stable  Diet recommendation: heart healthy  Filed Weights   04/19/22 1832  Weight: 85.1 kg    History of present illness:  No notes on file  Hospital Course:  Assessment and Plan: Stroke-like symptoms Appreciate neurology assistance MRI did not show any acute stroke, but moderately advanced chronic microvascular ischemic disease with Moncerrat Burnstein few small remote cerebellar infarcts CTA negative for LVO or other emergent findings, mild for age atheromatous change about the carotid birfurcations and carotid siphons without hemodynamically significant stenosis Echo with akinesis of distal anteroseptal wall, apex, and distal inferior wall, EF 25-30%, grade 1 diastolic dysfunction LDL 40, A1c 6.5 Neurology recommending DAPT x3 weeks, then aspirin alone.  Continue crestor.   Chronic systolic heart failure (HCC) Resume home meds at discharge -Last echocardiogram on 05/2020 with EF of 30% with mid to apical inferior septal and anteroseptal hypokinesis.  Grade 1  diastolic dysfunction.  Monitor fluid status closely while receiving fluid for AKI\ - repeat echo with akinesis of distal anteroseptal wall, apex, and distal inferior wall, EF 25-30%, grade 1 diastolic dysfunction (see report)   Coronary artery disease involving native coronary artery of native heart without angina pectoris S/p CABG Continue PTA aspirin, statin, coreg, spironolactone, entresto  Hyperkalemia Repeat K improved Likely spurious, follow   AKI (acute kidney injury) (HCC) Looks like this maybe her baseline? CKD IIIb? Follow outpatient  COPD (chronic obstructive pulmonary disease) (HCC) Stable.  Continue home bronchodilator.  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  Hold on any heparin/LMWH DVT prophylaxis. -Per hematology in the past,  no contraindication to remain on antiplatelet agents or anticoagulants as long as the platelet is greater than 50,000.    Obesity (BMI 30-39.9) noted  Abnormal MRI of head 1.1 T2 hypointense lesion within L parotid gland Needs outpatient non emergent ENT referral        Procedures: Echo IMPRESSIONS     1. Akinesis of the distal anteroseptal wall, apex and distal inferior  wall with overall severe LV dysfunction.   2. Left ventricular ejection fraction, by estimation, is 25 to 30%. The  left ventricle has severely decreased function. The left ventricle  demonstrates regional wall motion abnormalities (see scoring  diagram/findings for description). Left ventricular  diastolic parameters are consistent with Grade I diastolic dysfunction  (impaired relaxation).   3. Right ventricular systolic function is normal. The right ventricular  size is normal.   4. The mitral valve is normal in structure. Trivial mitral valve  regurgitation.  No evidence of mitral  stenosis.   5. The aortic valve is tricuspid. Aortic valve regurgitation is not  visualized. Aortic valve sclerosis is present, with no evidence of aortic  valve stenosis.   6. The inferior vena cava is normal in size with greater than 50%  respiratory variability, suggesting right atrial pressure of 3 mmHg.   Consultations: neurology  Discharge Exam: Vitals:   04/20/22 0930 04/20/22 1215  BP: 125/79 (!) 121/98  Pulse: 82 78  Resp: 20 17  Temp:    SpO2: 99% 98%   No complaints Discussed with daughter as well  General: No acute distress. Cardiovascular: RRR Lungs: unlabored Abdomen: Soft, nontender, nondistended  Neurological: Alert and oriented 3. Chronic mild LLE weakness.  CN2-12 intact, moving all extremities with equal strength (except LLE which she attributes to sciatica, chronic) Skin: Warm and dry. No rashes or lesions. Extremities: No clubbing or cyanosis. No edema. Discharge Instructions   Discharge Instructions     Ambulatory referral to Neurology   Complete by: As directed    An appointment is requested in approximately: 4 weeks   Call MD for:  difficulty breathing, headache or visual disturbances   Complete by: As directed    Call MD for:  extreme fatigue   Complete by: As directed    Call MD for:  hives   Complete by: As directed    Call MD for:  persistant dizziness or light-headedness   Complete by: As directed    Call MD for:  persistant nausea and vomiting   Complete by: As directed    Call MD for:  redness, tenderness, or signs of infection (pain, swelling, redness, odor or green/yellow discharge around incision site)   Complete by: As directed    Call MD for:  severe uncontrolled pain   Complete by: As directed    Call MD for:  temperature >100.4   Complete by: As directed    Diet - low sodium heart healthy   Complete by: As directed    Discharge instructions   Complete by: As directed    You were seen for [name] TIA (transient ischemic  attack).  Your MRI did not show any new stroke, but it did show evidence of prior strokes.  We're going to start you on aspirin and plavix for 3 weeks, then you'll take aspirin by itself.  Continue your crestor.    You have diabetes, your a1c is 6.5.  Please follow up with your PCP regarding next steps for this.   You had abnormal parotid gland imaging that needs follow up outpatient with ENT.   Return for new, recurrent, or worsening symptoms.  Please ask your PCP to request records from this hospitalization so they know what was done and what the next steps will be.   Increase activity slowly   Complete by: As directed       Allergies as of 04/20/2022   No Known Allergies      Medication List     TAKE these medications    acetaminophen-codeine 300-15 MG tablet Commonly known as: TYLENOL #2 Take 1 tablet by mouth 2 (two) times daily as needed for moderate pain.   albuterol 108 (90 Base) MCG/ACT inhaler Commonly known as: VENTOLIN HFA Inhale 1-2 puffs into the lungs every 6 (six) hours as needed for wheezing.   aspirin 81 MG chewable tablet Chew 1 tablet (81 mg total) by mouth daily. Start taking on: April 21, 2022   BiDil 20-37.5 MG tablet  Generic drug: isosorbide-hydrALAZINE TAKE 1 TABLET BY MOUTH THREE TIMES DAILY   budesonide-formoterol 160-4.5 MCG/ACT inhaler Commonly known as: SYMBICORT Inhale 2 puffs into the lungs 2 (two) times daily.   carvedilol 12.5 MG tablet Commonly known as: COREG Take 1 tablet (12.5 mg total) by mouth 2 (two) times daily.   clopidogrel 75 MG tablet Commonly known as: PLAVIX Take 1 tablet (75 mg total) by mouth daily for 21 days. Take for 3 weeks with aspirin, then take aspirin alone.   Entresto 49-51 MG Generic drug: sacubitril-valsartan Take 1 tablet by mouth 2 (two) times daily.   famotidine 20 MG tablet Commonly known as: PEPCID Take 1 tablet (20 mg total) by mouth 2 (two) times daily. Take one tablet twice daily for two  days   furosemide 40 MG tablet Commonly known as: LASIX Take 1 tablet (40 mg total) by mouth daily. Please keep upcoming appt. In April with Dr. Excell Seltzer in order to receive future refills. Thank You. What changed: additional instructions   multivitamin tablet Take 1 tablet by mouth daily.   nitroGLYCERIN 0.4 MG SL tablet Commonly known as: NITROSTAT PLACE 1 TABLET UNDER TONGUE EVERY 5 MINUTES AS NEEDED FOR CHEST PAIN What changed: See the new instructions.   Omega 3 1000 MG Caps Take 1 capsule by mouth daily.   QC Tumeric Complex 500 MG Caps Generic drug: Turmeric Take 1 capsule by mouth daily. Patient taking 400 mg daily   rosuvastatin 40 MG tablet Commonly known as: CRESTOR Take 1 tablet (40 mg total) by mouth daily.   spironolactone 25 MG tablet Commonly known as: ALDACTONE TAKE 1 TABLET(25 MG) BY MOUTH DAILY What changed: See the new instructions.   tiotropium 18 MCG inhalation capsule Commonly known as: SPIRIVA Place 18 mcg into inhaler and inhale daily.               Durable Medical Equipment  (From admission, onward)           Start     Ordered   04/20/22 1411  For home use only DME Walker rolling  Once       Question Answer Comment  Walker: With 5 Inch Wheels   Patient needs Brittanny Levenhagen walker to treat with the following condition TIA (transient ischemic attack)      04/20/22 1410   04/20/22 1410  For home use only DME Tub bench  Once        04/20/22 1410           No Known Allergies    The results of significant diagnostics from this hospitalization (including imaging, microbiology, ancillary and laboratory) are listed below for reference.    Significant Diagnostic Studies: ECHOCARDIOGRAM COMPLETE  Result Date: 04/20/2022    ECHOCARDIOGRAM REPORT   Patient Name:   Jonay SHERIAL EBRAHIM Date of Exam: 04/20/2022 Medical Rec #:  [id number]     Height:       65.0 in Accession #:    [id number]    Weight:       187.6 lb Date of Birth:  [birth year]     BSA:           1.925 m Patient Age:    81 years      BP:           118/79 mmHg Patient Gender: F             HR:           70 bpm. Exam Location:  Inpatient Procedure: 2D Echo, Cardiac Doppler and Color Doppler Indications:    TIA  History:        Patient has prior history of Echocardiogram examinations, most                 recent 06/11/2020. Chronic systolic heart failure, CAD, Prior                 CABG; Risk Factors:Hypertension and HLD.  Sonographer:    Rodrigo Ranesiree Vazquez RCS Referring Phys: [id number] CHING T TU IMPRESSIONS  1. Akinesis of the distal anteroseptal wall, apex and distal inferior wall with overall severe LV dysfunction.  2. Left ventricular ejection fraction, by estimation, is 25 to 30%. The left ventricle has severely decreased function. The left ventricle demonstrates regional wall motion abnormalities (see scoring diagram/findings for description). Left ventricular diastolic parameters are consistent with Grade I diastolic dysfunction (impaired relaxation).  3. Right ventricular systolic function is normal. The right ventricular size is normal.  4. The mitral valve is normal in structure. Trivial mitral valve regurgitation. No evidence of mitral stenosis.  5. The aortic valve is tricuspid. Aortic valve regurgitation is not visualized. Aortic valve sclerosis is present, with no evidence of aortic valve stenosis.  6. The inferior vena cava is normal in size with greater than 50% respiratory variability, suggesting right atrial pressure of 3 mmHg. FINDINGS  Left Ventricle: Left ventricular ejection fraction, by estimation, is 25 to 30%. The left ventricle has severely decreased function. The left ventricle demonstrates regional wall motion abnormalities. The left ventricular internal cavity size was normal  in size. There is no left ventricular hypertrophy. Left ventricular diastolic parameters are consistent with Grade I diastolic dysfunction (impaired relaxation). Right Ventricle: The right ventricular size is  normal. Right ventricular systolic function is normal. Left Atrium: Left atrial size was normal in size. Right Atrium: Right atrial size was normal in size. Pericardium: There is no evidence of pericardial effusion. Mitral Valve: The mitral valve is normal in structure. Trivial mitral valve regurgitation. No evidence of mitral valve stenosis. Tricuspid Valve: The tricuspid valve is normal in structure. Tricuspid valve regurgitation is trivial. No evidence of tricuspid stenosis. Aortic Valve: The aortic valve is tricuspid. Aortic valve regurgitation is not visualized. Aortic valve sclerosis is present, with no evidence of aortic valve stenosis. Aortic valve mean gradient measures 5.0 mmHg. Aortic valve peak gradient measures 7.6  mmHg. Pulmonic Valve: The pulmonic valve was normal in structure. Pulmonic valve regurgitation is not visualized. No evidence of pulmonic stenosis. Aorta: The aortic root is normal in size and structure. Venous: The inferior vena cava is normal in size with greater than 50% respiratory variability, suggesting right atrial pressure of 3 mmHg. IAS/Shunts: No atrial level shunt detected by color flow Doppler. Additional Comments: Akinesis of the distal anteroseptal wall, apex and distal inferior wall with overall severe LV dysfunction.  LEFT VENTRICLE PLAX 2D LVIDd:         5.00 cm     Diastology LVIDs:         4.35 cm     LV e' medial:    4.29 cm/s LV PW:         0.90 cm     LV E/e' medial:  16.4 LV IVS:        1.00 cm     LV e' lateral:   7.07 cm/s LVOT diam:     2.00 cm     LV E/e' lateral: 9.9 LV SV:  46 LV SV Index:   24 LVOT Area:     3.14 cm  LV Volumes (MOD) LV vol d, MOD A2C: 96.1 ml LV vol d, MOD A4C: 95.2 ml LV vol s, MOD A2C: 85.0 ml LV vol s, MOD A4C: 78.1 ml LV SV MOD A2C:     11.1 ml LV SV MOD A4C:     95.2 ml LV SV MOD BP:      12.9 ml RIGHT VENTRICLE             IVC RV Basal diam:  2.40 cm     IVC diam: 1.90 cm RV Mid diam:    1.90 cm RV S prime:     10.10 cm/s TAPSE  (M-mode): 1.2 cm LEFT ATRIUM             Index        RIGHT ATRIUM           Index LA diam:        2.50 cm 1.30 cm/m   RA Area:     10.90 cm LA Vol (A2C):   33.8 ml 17.56 ml/m  RA Volume:   23.70 ml  12.31 ml/m LA Vol (A4C):   26.3 ml 13.66 ml/m LA Biplane Vol: 31.1 ml 16.16 ml/m  AORTIC VALVE                     PULMONIC VALVE AV Area (Vmax):    1.80 cm      PV Vmax:       1.15 m/s AV Area (Vmean):   1.33 cm      PV Peak grad:  5.3 mmHg AV Area (VTI):     1.68 cm AV Vmax:           138.00 cm/s AV Vmean:          113.000 cm/s AV VTI:            0.277 m AV Peak Grad:      7.6 mmHg AV Mean Grad:      5.0 mmHg LVOT Vmax:         79.00 cm/s LVOT Vmean:        47.900 cm/s LVOT VTI:          0.148 m LVOT/AV VTI ratio: 0.53  AORTA Ao Root diam: 2.90 cm Ao Asc diam:  3.40 cm MITRAL VALVE               TRICUSPID VALVE MV Area (PHT): 6.77 cm    TR Peak grad:   24.6 mmHg MV Decel Time: 112 msec    TR Vmax:        248.00 cm/s MV E velocity: 70.30 cm/s MV Riyad Keena velocity: 85.70 cm/s  SHUNTS MV E/Curt Oatis ratio:  0.82        Systemic VTI:  0.15 m                            Systemic Diam: 2.00 cm Olga Millers MD Electronically signed by Olga Millers MD Signature Date/Time: 04/20/2022/11:03:22 AM    Final    MR BRAIN WO CONTRAST  Result Date: 04/20/2022 CLINICAL DATA:  Initial evaluation for acute TIA, dizziness, aphasia. EXAM: MRI HEAD WITHOUT CONTRAST TECHNIQUE: Multiplanar, multiecho pulse sequences of the brain and surrounding structures were obtained without intravenous contrast. COMPARISON:  Prior CTs from 04/19/2022. FINDINGS: Brain: Cerebral volume within normal limits. Patchy and confluent T2/FLAIR  hyperintensity involving the periventricular, deep, and subcortical white matter both cerebral hemispheres, nonspecific, but most likely related to chronic small vessel ischemic disease, moderately advanced in nature. Few small remote cerebellar infarcts noted. No abnormal foci of restricted diffusion to suggest acute or  subacute ischemia. Note made of Heavan Francom few small patchy foci of apparent diffusion abnormality along the surface of the brain at the parietal vertex, favored to be artifactual nature due to the overlying calvarium (series 7, images 42, 40). Gray-white matter differentiation maintained. No other areas of chronic cortical infarction. No acute intracranial hemorrhage. Few scattered chronic micro hemorrhages noted, somewhat peripheral in location, nonspecific. No mass lesion, midline shift or mass effect. No hydrocephalus or extra-axial fluid collection. Pituitary gland suprasellar region normal. Vascular: Major intracranial vascular flow voids are maintained. Skull and upper cervical spine: Craniocervical junction within normal limits. Bone marrow signal intensity normal. No scalp soft tissue abnormality. Sinuses/Orbits: Prior bilateral ocular lens replacement. Paranasal sinuses are largely clear. No mastoid effusion. Other: 1.1 cm T2 hypointense lesion within the left parotid gland, indeterminate. IMPRESSION: 1. No acute intracranial infarct or other abnormality. 2. Moderately advanced chronic microvascular ischemic disease, with Darilyn Storbeck few small remote cerebellar infarcts. 3. 1.1 cm T2 hypointense lesion within the left parotid gland, indeterminate. Nonemergent outpatient ENT referral could be performed for further evaluation as warranted. Electronically Signed   By: Rise Mu M.D.   On: 04/20/2022 01:09   CT ANGIO HEAD NECK W WO CM (CODE STROKE)  Result Date: 04/19/2022 CLINICAL DATA:  Initial evaluation for acute TIA, dizziness, slurred speech, now resolved. EXAM: CT ANGIOGRAPHY HEAD AND NECK TECHNIQUE: Multidetector CT imaging of the head and neck was performed using the standard protocol during bolus administration of intravenous contrast. Multiplanar CT image reconstructions and MIPs were obtained to evaluate the vascular anatomy. Carotid stenosis measurements (when applicable) are obtained utilizing NASCET  criteria, using the distal internal carotid diameter as the denominator. RADIATION DOSE REDUCTION: This exam was performed according to the departmental dose-optimization program which includes automated exposure control, adjustment of the mA and/or kV according to patient size and/or use of iterative reconstruction technique. CONTRAST:  81mL OMNIPAQUE IOHEXOL 350 MG/ML SOLN COMPARISON:  Comparison made with prior noncontrast head CT performed earlier the same day. FINDINGS: CTA NECK FINDINGS Aortic arch: Visualized aortic arch normal caliber with standard 3 vessel branching. Moderate atheromatous change about the arch itself. No significant stenosis about the origin the great vessels. Right carotid system: Right common and internal carotid arteries are tortuous but widely patent without significant stenosis or dissection. Mild for age atheromatous change about the right carotid bulb. Left carotid system: Left common and internal carotid arteries are tortuous but widely patent without stenosis or dissection. Mild for age atheromatous change about the left carotid bulb. Vertebral arteries: Both vertebral arteries arise from subclavian arteries. No proximal subclavian artery stenosis. Vertebral arteries patent without stenosis or dissection. Skeleton: No discrete or worrisome osseous lesions. Mild for age cervical spondylosis without stenosis. Patient is edentulous. Other neck: No other acute soft tissue abnormality within the neck. 1.2 cm well-circumscribed soft tissue nodule present within the left parotid gland, indeterminate. Asymmetric prominence of the right retropharyngeal fat versus superimposed lipoma noted adjacent to the right carotid bifurcation. 9 mm left thyroid nodule noted, of doubtful significance given size and patient age, no follow-up imaging recommended (ref: J Am Coll Radiol. 2015 Feb;12(2): 143-50). Upper chest: Visualized upper chest demonstrates no acute finding. Review of the MIP images  confirms the above findings  CTA HEAD FINDINGS Anterior circulation: Petrous segments patent bilaterally. Scattered atheromatous change seen within the carotid siphons with associated mild to moderate narrowing, left slightly worse than right. A1 segments patent. Normal anterior communicating artery complex. Anterior cerebral arteries patent without stenosis. No M1 stenosis or occlusion. No proximal MCA branch occlusion. Distal MCA branches perfused and symmetric. Posterior circulation: Mild nonstenotic plaque noted within the mid left V4 segment. Both V4 segments patent without significant stenosis. Left vertebral artery dominant. Both PICA patent. Basilar patent to its distal aspect without stenosis. Superior cerebral arteries patent bilaterally. Both PCAs primarily supplied via the basilar well perfused or distal aspects. Venous sinuses: Grossly patent allowing for timing the contrast bolus. Anatomic variants: None significant.  No aneurysm. Review of the MIP images confirms the above findings IMPRESSION: 1. Negative CTA for large vessel occlusion or other emergent finding. 2. Mild for age atheromatous change about the carotid bifurcations and carotid siphons without hemodynamically significant stenosis. 3. 1.2 cm left parotid nodule, indeterminate. Nonemergent outpatient ENT referral could be performed for further evaluation as warranted. 4.  Aortic Atherosclerosis (ICD10-I70.0). These results were communicated to Dr. Wilford Corner at 7:36 pm on 04/19/2022 by text page via the [facility] messaging system. Electronically Signed   By: Rise Mu M.D.   On: 04/19/2022 19:49   CT HEAD CODE STROKE WO CONTRAST  Result Date: 04/19/2022 CLINICAL DATA:  Code stroke. EXAM: CT HEAD WITHOUT CONTRAST TECHNIQUE: Contiguous axial images were obtained from the base of the skull through the vertex without intravenous contrast. RADIATION DOSE REDUCTION: This exam was performed according to the departmental dose-optimization program  which includes automated exposure control, adjustment of the mA and/or kV according to patient size and/or use of iterative reconstruction technique. COMPARISON:  Prior CT from 07/08/2004. FINDINGS: Brain: Generalized age-related cerebral atrophy. Patchy hypodensity involving the supratentorial cerebral white matter most likely related chronic microvascular ischemic disease, moderate to advanced in nature. No acute intracranial hemorrhage. No acute large vessel territory infarct. No mass lesion, mass effect or midline shift. No hydrocephalus or extra-axial fluid collection. Vascular: No hyperdense vessel. Calcified atherosclerosis present at skull base. Skull: Scalp soft tissues and calvarium within normal limits. Sinuses/Orbits: Globes and orbital soft tissues demonstrate no acute finding. Paranasal sinuses are largely clear. No mastoid effusion. Other: None. ASPECTS [facility] Stroke Program Early CT Score) - Ganglionic level infarction (caudate, lentiform nuclei, internal capsule, insula, M1-M3 cortex): 7 - Supraganglionic infarction (M4-M6 cortex): 3 Total score (0-10 with 10 being normal): 10 IMPRESSION: 1. No acute intracranial abnormality. 2. ASPECTS is 10. 3. Age-related cerebral atrophy with moderate to advanced chronic small vessel ischemic disease. These results were communicated to Dr. Wilford Corner at 7:18 pm on 04/19/2022 by text page via the [facility] messaging system. Electronically Signed   By: Rise Mu M.D.   On: 04/19/2022 19:20    Microbiology: Recent Results (from the past 240 hour(s))  Resp Panel by RT-PCR (Flu Phillis Thackeray&B, Covid) Anterior Nasal Swab     Status: None   Collection Time: 04/19/22  8:41 PM   Specimen: Anterior Nasal Swab  Result Value Ref Range Status   SARS Coronavirus 2 by RT PCR NEGATIVE NEGATIVE Final    Comment: (NOTE) SARS-CoV-2 target nucleic acids are NOT DETECTED.  The SARS-CoV-2 RNA is generally detectable in upper respiratory specimens during the acute phase of  infection. The lowest concentration of SARS-CoV-2 viral copies this assay can detect is 138 copies/mL. Rhyanna Sorce negative result does not preclude SARS-Cov-2 infection and should not be used as  the sole basis for treatment or other patient management decisions. Dorine Duffey negative result may occur with  improper specimen collection/handling, submission of specimen other than nasopharyngeal swab, presence of viral mutation(s) within the areas targeted by this assay, and inadequate number of viral copies(<138 copies/mL). Yvonnia Tango negative result must be combined with clinical observations, patient history, and epidemiological information. The expected result is Negative.  Fact Sheet for Patients:  BloggerCourse.com  Fact Sheet for Healthcare Providers:  SeriousBroker.it  This test is no t yet approved or cleared by the [place] FDA and  has been authorized for detection and/or diagnosis of SARS-CoV-2 by FDA under an Emergency Use Authorization (EUA). This EUA will remain  in effect (meaning this test can be used) for the duration of the COVID-19 declaration under Section 564(b)(1) of the Act, 21 U.S.C.section 360bbb-3(b)(1), unless the authorization is terminated  or revoked sooner.       Influenza Lylee Corrow by PCR NEGATIVE NEGATIVE Final   Influenza B by PCR NEGATIVE NEGATIVE Final    Comment: (NOTE) The Xpert Xpress SARS-CoV-2/FLU/RSV plus assay is intended as an aid in the diagnosis of influenza from Nasopharyngeal swab specimens and should not be used as Elvin Mccartin sole basis for treatment. Nasal washings and aspirates are unacceptable for Xpert Xpress SARS-CoV-2/FLU/RSV testing.  Fact Sheet for Patients: BloggerCourse.com  Fact Sheet for Healthcare Providers: SeriousBroker.it  This test is not yet approved or cleared by the [place] FDA and has been authorized for detection and/or diagnosis of SARS-CoV-2  by FDA under an Emergency Use Authorization (EUA). This EUA will remain in effect (meaning this test can be used) for the duration of the COVID-19 declaration under Section 564(b)(1) of the Act, 21 U.S.C. section 360bbb-3(b)(1), unless the authorization is terminated or revoked.  Performed at [facility], [address], [place], [place] [address]      Labs: Basic Metabolic Panel: Recent Labs  Lab 04/19/22 1850 04/19/22 1900 04/20/22 0117  NA 139 140  --   K 3.8 6.8* 3.5  CL 103 104  --   CO2 27  --   --   GLUCOSE 107* 101*  --   BUN 12 19  --   CREATININE 1.17* 1.20*  --   CALCIUM 8.8*  --   --    Liver Function Tests: Recent Labs  Lab 04/19/22 1850  AST 15  ALT 8  ALKPHOS 74  BILITOT 1.1  PROT 7.1  ALBUMIN 3.6   No results for input(s): "LIPASE", "AMYLASE" in the last 168 hours. No results for input(s): "AMMONIA" in the last 168 hours. CBC: Recent Labs  Lab 04/19/22 1850 04/19/22 1900  WBC 11.1*  --   NEUTROABS 8.3*  --   HGB 15.4* 16.7*  HCT 48.8* 49.0*  MCV 89.9  --   PLT 96*  --    Cardiac Enzymes: No results for input(s): "CKTOTAL", "CKMB", "CKMBINDEX", "TROPONINI" in the last 168 hours. BNP: BNP (last 3 results) No results for input(s): "BNP" in the last 8760 hours.  ProBNP (last 3 results) No results for input(s): "PROBNP" in the last 8760 hours.  CBG: Recent Labs  Lab 04/19/22 1830  GLUCAP 117*       Signed:  Lacretia Nicks MD.  [facility] 04/20/2022, 2:27 PM

## 2022-04-20 NOTE — Hospital Course (Signed)
Camera [name] is [name] 81 y.o. female with medical history significant of CAD s/p CABG, PVCs, chronic systolic heart failure, hypertension, hyperlipidemia, chronic thrombocytopenia who presents with concerns of slurred speech and dizziness.  She was admitted for TIA.  Seen by neurology recommending DAPT x3 weeks, then aspirin alone.  See below for additional details

## 2022-04-20 NOTE — Assessment & Plan Note (Signed)
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  Continue crestor.

## 2022-04-20 NOTE — Evaluation (Signed)
Physical Therapy Evaluation Patient Details Name: [name] MRN: [id number] DOB: 30-Mar-1941 Today's Date: 04/20/2022  History of Present Illness  Pt is an 81 year old woman admitted on 04/19/22 with slurred speech, dizziness and blurred vision, all resolved before arrival of EMS. Brain MRI negative for acute changes. PMH: CAD s/p CABG, HTN, HLD, ischemic cardiomyopathy, CHF, emphysema, PVCs, thrombocytopenia.  Clinical Impression  Pt was seen for mobility on RW with help to maneuver with very minimal direction.  Pt is mildly weak, but also home with her daughter for continual assistance with mobility issues.  Will recommend PT see pt in acute therapy to prepare for HHPT to follow up and work on higher level balance skills, LE strengthening and work on mobility around back and L hip to manage chronic pain that radiates to L hip.  Pt is motivated and prepared to work, and will be a good candidate for the therapy in both venues.  Follow along as POC outlines her goals.       Recommendations for follow up therapy are one component of a multi-disciplinary discharge planning process, led by the attending physician.  Recommendations may be updated based on patient status, additional functional criteria and insurance authorization.  Follow Up Recommendations Home health PT      Assistance Recommended at Discharge Intermittent Supervision/Assistance  Patient can return home with the following  A little help with walking and/or transfers;A little help with bathing/dressing/bathroom;Assistance with cooking/housework;Assist for transportation;Help with stairs or ramp for entrance    Equipment Recommendations Rolling walker (2 wheels)  Recommendations for Other Services       Functional Status Assessment Patient has had a recent decline in their functional status and demonstrates the ability to make significant improvements in function in a reasonable and predictable amount of time.     Precautions /  Restrictions Precautions Precautions: Fall Restrictions Weight Bearing Restrictions: No      Mobility  Bed Mobility Overal bed mobility: Needs Assistance             General bed mobility comments: On side of bed as therapists arrive    Transfers Overall transfer level: Needs assistance Equipment used: Rolling walker (2 wheels) Transfers: Sit to/from Stand Sit to Stand: Min guard           General transfer comment: pt slowly drops down side of stretcher to stand at walker    Ambulation/Gait Ambulation/Gait assistance: Min guard Gait Distance (Feet): 120 Feet Assistive device: Rolling walker (2 wheels), 1 person hand held assist Gait Pattern/deviations: Step-through pattern, Decreased stride length, Narrow base of support Gait velocity: reduced Gait velocity interpretation: <1.31 ft/sec, indicative of household ambulator   General Gait Details: pt is using RW with safe technique with slower turns and maintaining central spot inside walker  Stairs            Wheelchair Mobility    Modified Rankin (Stroke Patients Only)       Balance Overall balance assessment: Needs assistance   Sitting balance-Leahy Scale: Good       Standing balance-Leahy Scale: Poor                               Pertinent Vitals/Pain Pain Assessment Pain Assessment: Faces Faces Pain Scale: Hurts a little bit Pain Location: L groin Pain Descriptors / Indicators: Discomfort Pain Intervention(s): Monitored during session, Repositioned (pain originates from low back per pt)    Home Living  Family/patient expects to be discharged to:: Private residence Living Arrangements: Children Available Help at Discharge: Family;Available 24 hours/day Type of Home: Apartment Home Access: Level entry       Home Layout: One level Home Equipment: Agricultural consultant (2 wheels);Cane - single point;Toilet riser;Hand held shower head      Prior Function Prior Level of Function :  Independent/Modified Independent                     Hand Dominance   Dominant Hand: Right    Extremity/Trunk Assessment   Upper Extremity Assessment Upper Extremity Assessment: Defer to OT evaluation    Lower Extremity Assessment Lower Extremity Assessment: Generalized weakness    Cervical / Trunk Assessment Cervical / Trunk Assessment:  (very mild changes in thoracic spine)  Communication   Communication: No difficulties  Cognition Arousal/Alertness: Awake/alert Behavior During Therapy: WFL for tasks assessed/performed Overall Cognitive Status: Within Functional Limits for tasks assessed                                 General Comments: daughter reports pt needs to use walker        General Comments General comments (skin integrity, edema, etc.): pt is expecting to go home with her daughter and agrees to have HHPT see her for further work on strengthening and balance due to back pain impact on esp LLE    Exercises     Assessment/Plan    PT Assessment Patient needs continued PT services  PT Problem List Decreased balance;Decreased mobility;Decreased strength       PT Treatment Interventions DME instruction;Gait training;Stair training;Functional mobility training;Therapeutic activities;Therapeutic exercise;Balance training;Neuromuscular re-education;Patient/family education    PT Goals (Current goals can be found in the Care Plan section)  Acute Rehab PT Goals Patient Stated Goal: to get hoem and manage her pain on L side of back and leg PT Goal Formulation: With patient/family Time For Goal Achievement: 04/27/22 Potential to Achieve Goals: Good    Frequency Min 3X/week     Co-evaluation               AM-PAC PT "6 Clicks" Mobility  Outcome Measure Help needed turning from your back to your side while in a flat bed without using bedrails?: None Help needed moving from lying on your back to sitting on the side of a flat bed  without using bedrails?: A Little Help needed moving to and from a bed to a chair (including a wheelchair)?: A Little Help needed standing up from a chair using your arms (e.g., wheelchair or bedside chair)?: A Little Help needed to walk in hospital room?: A Little Help needed climbing 3-5 steps with a railing? : A Lot 6 Click Score: 18    End of Session   Activity Tolerance: Patient limited by fatigue Patient left: in bed;with call bell/phone within reach;with family/visitor present Nurse Communication: Mobility status PT Visit Diagnosis: Unsteadiness on feet (R26.81);Muscle weakness (generalized) (M62.81)    Time: 1249-1310 PT Time Calculation (min) (ACUTE ONLY): 21 min   Charges:   PT Evaluation $PT Eval Moderate Complexity: 1 Mod         Ivar Drape 04/20/2022, 4:11 PM  Samul Dada, PT PhD Acute Rehab Dept. Number: [facility] [phone] and [facility] [phone]

## 2022-04-20 NOTE — ED Notes (Signed)
Patient transported to MRI 

## 2022-04-20 NOTE — Progress Notes (Signed)
Oletta Cohn, RN, BSN, [place] [phone] Pt qualifies for DME [facility]) tub bench and rolling walker.  DME  ordered through [employer].  Danielle of [facility] notified to deliver DME to pt room prior to D/C home.

## 2022-04-20 NOTE — Evaluation (Signed)
Occupational Therapy Evaluation Patient Details Name: [name] MRN: [id number] DOB: [birth year] Today's Date: 04/20/2022   History of Present Illness Pt is an 81 year old woman admitted on 04/19/22 with slurred speech, dizziness and blurred vision, all resolved before arrival of EMS. Brain MRI negative for acute changes. PMH: CAD s/p CABG, HTN, HLD, ischemic cardiomyopathy, CHF, emphysema, PVCs, thrombocytopenia.   Clinical Impression   Pt walks without a device and stands to shower. She and her daughter worked together on housekeeping and meal prep. Pt does not drive, but goes with her daughter grocery shopping. Pt presents with impaired standing balance. Her daughter has been concerned about pt's fall risk. Recommend tub transfer bench for increased safety during showering. Per daughter, the RW they have at home is old and pt needs a new one. No further OT needs.      Recommendations for follow up therapy are one component of a multi-disciplinary discharge planning process, led by the attending physician.  Recommendations may be updated based on patient status, additional functional criteria and insurance authorization.   Follow Up Recommendations  No OT follow up    Assistance Recommended at Discharge Intermittent Supervision/Assistance  Patient can return home with the following Assistance with cooking/housework;Assist for transportation;Help with stairs or ramp for entrance    Functional Status Assessment  Patient has had a recent decline in their functional status and demonstrates the ability to make significant improvements in function in a reasonable and predictable amount of time.  Equipment Recommendations  Tub/shower bench Adult nurse)    Recommendations for Other Services       Precautions / Restrictions Precautions Precautions: Fall      Mobility Bed Mobility               General bed mobility comments: pt seated at EOB before and after session     Transfers Overall transfer level: Needs assistance Equipment used: Rolling walker (2 wheels) Transfers: Sit to/from Stand Sit to Stand: Min guard           General transfer comment: min guard from high stretcher      Balance Overall balance assessment: Needs assistance   Sitting balance-Leahy Scale: Good       Standing balance-Leahy Scale: Poor                             ADL either performed or assessed with clinical judgement   ADL                                               Vision Baseline Vision/History: 1 Wears glasses Ability to See in Adequate Light: 0 Adequate Patient Visual Report: No change from baseline       Perception     Praxis      Pertinent Vitals/Pain Pain Assessment Pain Assessment: Faces Faces Pain Scale: Hurts a little bit Pain Location: L groin Pain Descriptors / Indicators: Discomfort Pain Intervention(s): Monitored during session, Other (comment) (pt with hx of sciatica)     Hand Dominance Right   Extremity/Trunk Assessment Upper Extremity Assessment Upper Extremity Assessment: Overall WFL for tasks assessed   Lower Extremity Assessment Lower Extremity Assessment: Defer to PT evaluation   Cervical / Trunk Assessment Cervical / Trunk Assessment: Normal   Communication Communication Communication: No difficulties   Cognition  Arousal/Alertness: Awake/alert Behavior During Therapy: WFL for tasks assessed/performed Overall Cognitive Status: Within Functional Limits for tasks assessed                                 General Comments: daughter in room and with concerns of pt's fall risk     General Comments       Exercises     Shoulder Instructions      Home Living Family/patient expects to be discharged to:: Private residence Living Arrangements: Children (daughter) Available Help at Discharge: Family;Available 24 hours/day Type of Home: Apartment Home Access: Level  entry     Home Layout: One level     Bathroom Shower/Tub: Chief Strategy Officer: Standard     Home Equipment: Agricultural consultant (2 wheels);Cane - single point;Toilet riser;Hand held shower head          Prior Functioning/Environment Prior Level of Function : Independent/Modified Independent               ADLs Comments: works together with daughter on cooking and cleaning        OT Problem List:        OT Treatment/Interventions:      OT Goals(Current goals can be found in the care plan section)    OT Frequency:      Co-evaluation              AM-PAC OT "6 Clicks" Daily Activity     Outcome Measure Help from another person eating meals?: None Help from another person taking care of personal grooming?: None Help from another person toileting, which includes using toliet, bedpan, or urinal?: None Help from another person bathing (including washing, rinsing, drying)?: None Help from another person to put on and taking off regular upper body clothing?: None Help from another person to put on and taking off regular lower body clothing?: None 6 Click Score: 24   End of Session Equipment Utilized During Treatment: Rolling walker (2 wheels) Nurse Communication: Mobility status;Other (comment) (equipment needs)  Activity Tolerance: Patient tolerated treatment well Patient left: in bed;with call bell/phone within reach;with family/visitor present  OT Visit Diagnosis: Other abnormalities of gait and mobility (R26.89)                Time: 6010-9323 OT Time Calculation (min): 18 min Charges:  OT General Charges $OT Visit: 1 Visit OT Evaluation $OT Eval Low Complexity: 1 Low  Berna Spare, OTR/L Acute Rehabilitation Services Office: [phone]   Evern Bio 04/20/2022, 1:28 PM

## 2022-04-20 NOTE — Assessment & Plan Note (Addendum)
Repeat K improved Likely spurious, follow

## 2022-04-20 NOTE — Progress Notes (Addendum)
STROKE TEAM PROGRESS NOTE   INTERVAL HISTORY Patient is seen in her room with no family at the bedside.  Yesterday, she had an acute episode of blurred vision, slurred speech and dizziness which lasted about ten minutes.  Symptoms resolved spontaneously, and MRI was negative for acute stroke.  Vitals:   04/20/22 0430 04/20/22 0630 04/20/22 0930 04/20/22 1215  BP: 118/66 122/84 125/79 (!) 121/98  Pulse: 69 66 82 78  Resp: 17 14 20 17   Temp:      TempSrc:      SpO2: 98% 95% 99% 98%  Weight:      Height:       CBC:  Recent Labs  Lab 04/19/22 1850 04/19/22 1900  WBC 11.1*  --   NEUTROABS 8.3*  --   HGB 15.4* 16.7*  HCT 48.8* 49.0*  MCV 89.9  --   PLT 96*  --    Basic Metabolic Panel:  Recent Labs  Lab 04/19/22 1850 04/19/22 1900 04/20/22 0117  NA 139 140  --   K 3.8 6.8* 3.5  CL 103 104  --   CO2 27  --   --   GLUCOSE 107* 101*  --   BUN 12 19  --   CREATININE 1.17* 1.20*  --   CALCIUM 8.8*  --   --    Lipid Panel:  Recent Labs  Lab 04/20/22 0344  CHOL 88  TRIG 63  HDL 35*  CHOLHDL 2.5  VLDL 13  LDLCALC 40   HgbA1c:  Recent Labs  Lab 04/19/22 1850  HGBA1C 6.5*   Urine Drug Screen:  Recent Labs  Lab 04/19/22 1850  LABOPIA POSITIVE*  COCAINSCRNUR NONE DETECTED  LABBENZ NONE DETECTED  AMPHETMU NONE DETECTED  THCU NONE DETECTED  LABBARB NONE DETECTED    Alcohol Level  Recent Labs  Lab 04/20/22 0344  ETH <10    IMAGING past 24 hours ECHOCARDIOGRAM COMPLETE  Result Date: 04/20/2022    ECHOCARDIOGRAM REPORT   Patient Name:   [name] Date of Exam: 04/20/2022 Medical Rec #:  06/21/2022     Height:       65.0 in Accession #:    [id number]    Weight:       187.6 lb Date of Birth:  1940-10-30     BSA:          1.925 m Patient Age:    81 years      BP:           118/79 mmHg Patient Gender: F             HR:           70 bpm. Exam Location:  Inpatient Procedure: 2D Echo, Cardiac Doppler and Color Doppler Indications:    TIA  History:        Patient has  prior history of Echocardiogram examinations, most                 recent 06/11/2020. Chronic systolic heart failure, CAD, Prior                 CABG; Risk Factors:Hypertension and HLD.  Sonographer:    06/13/2020 RCS Referring Phys: Rodrigo Ran CHING T TU IMPRESSIONS  1. Akinesis of the distal anteroseptal wall, apex and distal inferior wall with overall severe LV dysfunction.  2. Left ventricular ejection fraction, by estimation, is 25 to 30%. The left ventricle has severely decreased function. The left ventricle demonstrates regional wall  motion abnormalities (see scoring diagram/findings for description). Left ventricular diastolic parameters are consistent with Grade I diastolic dysfunction (impaired relaxation).  3. Right ventricular systolic function is normal. The right ventricular size is normal.  4. The mitral valve is normal in structure. Trivial mitral valve regurgitation. No evidence of mitral stenosis.  5. The aortic valve is tricuspid. Aortic valve regurgitation is not visualized. Aortic valve sclerosis is present, with no evidence of aortic valve stenosis.  6. The inferior vena cava is normal in size with greater than 50% respiratory variability, suggesting right atrial pressure of 3 mmHg. FINDINGS  Left Ventricle: Left ventricular ejection fraction, by estimation, is 25 to 30%. The left ventricle has severely decreased function. The left ventricle demonstrates regional wall motion abnormalities. The left ventricular internal cavity size was normal  in size. There is no left ventricular hypertrophy. Left ventricular diastolic parameters are consistent with Grade I diastolic dysfunction (impaired relaxation). Right Ventricle: The right ventricular size is normal. Right ventricular systolic function is normal. Left Atrium: Left atrial size was normal in size. Right Atrium: Right atrial size was normal in size. Pericardium: There is no evidence of pericardial effusion. Mitral Valve: The mitral valve is  normal in structure. Trivial mitral valve regurgitation. No evidence of mitral valve stenosis. Tricuspid Valve: The tricuspid valve is normal in structure. Tricuspid valve regurgitation is trivial. No evidence of tricuspid stenosis. Aortic Valve: The aortic valve is tricuspid. Aortic valve regurgitation is not visualized. Aortic valve sclerosis is present, with no evidence of aortic valve stenosis. Aortic valve mean gradient measures 5.0 mmHg. Aortic valve peak gradient measures 7.6  mmHg. Pulmonic Valve: The pulmonic valve was normal in structure. Pulmonic valve regurgitation is not visualized. No evidence of pulmonic stenosis. Aorta: The aortic root is normal in size and structure. Venous: The inferior vena cava is normal in size with greater than 50% respiratory variability, suggesting right atrial pressure of 3 mmHg. IAS/Shunts: No atrial level shunt detected by color flow Doppler. Additional Comments: Akinesis of the distal anteroseptal wall, apex and distal inferior wall with overall severe LV dysfunction.  LEFT VENTRICLE PLAX 2D LVIDd:         5.00 cm     Diastology LVIDs:         4.35 cm     LV e' medial:    4.29 cm/s LV PW:         0.90 cm     LV E/e' medial:  16.4 LV IVS:        1.00 cm     LV e' lateral:   7.07 cm/s LVOT diam:     2.00 cm     LV E/e' lateral: 9.9 LV SV:         46 LV SV Index:   24 LVOT Area:     3.14 cm  LV Volumes (MOD) LV vol d, MOD A2C: 96.1 ml LV vol d, MOD A4C: 95.2 ml LV vol s, MOD A2C: 85.0 ml LV vol s, MOD A4C: 78.1 ml LV SV MOD A2C:     11.1 ml LV SV MOD A4C:     95.2 ml LV SV MOD BP:      12.9 ml RIGHT VENTRICLE             IVC RV Basal diam:  2.40 cm     IVC diam: 1.90 cm RV Mid diam:    1.90 cm RV S prime:     10.10 cm/s TAPSE (M-mode): 1.2 cm LEFT  ATRIUM             Index        RIGHT ATRIUM           Index LA diam:        2.50 cm 1.30 cm/m   RA Area:     10.90 cm LA Vol (A2C):   33.8 ml 17.56 ml/m  RA Volume:   23.70 ml  12.31 ml/m LA Vol (A4C):   26.3 ml 13.66 ml/m  LA Biplane Vol: 31.1 ml 16.16 ml/m  AORTIC VALVE                     PULMONIC VALVE AV Area (Vmax):    1.80 cm      PV Vmax:       1.15 m/s AV Area (Vmean):   1.33 cm      PV Peak grad:  5.3 mmHg AV Area (VTI):     1.68 cm AV Vmax:           138.00 cm/s AV Vmean:          113.000 cm/s AV VTI:            0.277 m AV Peak Grad:      7.6 mmHg AV Mean Grad:      5.0 mmHg LVOT Vmax:         79.00 cm/s LVOT Vmean:        47.900 cm/s LVOT VTI:          0.148 m LVOT/AV VTI ratio: 0.53  AORTA Ao Root diam: 2.90 cm Ao Asc diam:  3.40 cm MITRAL VALVE               TRICUSPID VALVE MV Area (PHT): 6.77 cm    TR Peak grad:   24.6 mmHg MV Decel Time: 112 msec    TR Vmax:        248.00 cm/s MV E velocity: 70.30 cm/s MV A velocity: 85.70 cm/s  SHUNTS MV E/A ratio:  0.82        Systemic VTI:  0.15 m                            Systemic Diam: 2.00 cm Olga Millers MD Electronically signed by Olga Millers MD Signature Date/Time: 04/20/2022/11:03:22 AM    Final    MR BRAIN WO CONTRAST  Result Date: 04/20/2022 CLINICAL DATA:  Initial evaluation for acute TIA, dizziness, aphasia. EXAM: MRI HEAD WITHOUT CONTRAST TECHNIQUE: Multiplanar, multiecho pulse sequences of the brain and surrounding structures were obtained without intravenous contrast. COMPARISON:  Prior CTs from 04/19/2022. FINDINGS: Brain: Cerebral volume within normal limits. Patchy and confluent T2/FLAIR hyperintensity involving the periventricular, deep, and subcortical white matter both cerebral hemispheres, nonspecific, but most likely related to chronic small vessel ischemic disease, moderately advanced in nature. Few small remote cerebellar infarcts noted. No abnormal foci of restricted diffusion to suggest acute or subacute ischemia. Note made of a few small patchy foci of apparent diffusion abnormality along the surface of the brain at the parietal vertex, favored to be artifactual nature due to the overlying calvarium (series 7, images 42, 40). Gray-white matter  differentiation maintained. No other areas of chronic cortical infarction. No acute intracranial hemorrhage. Few scattered chronic micro hemorrhages noted, somewhat peripheral in location, nonspecific. No mass lesion, midline shift or mass effect. No hydrocephalus or extra-axial fluid collection. Pituitary gland suprasellar region normal. Vascular:  Major intracranial vascular flow voids are maintained. Skull and upper cervical spine: Craniocervical junction within normal limits. Bone marrow signal intensity normal. No scalp soft tissue abnormality. Sinuses/Orbits: Prior bilateral ocular lens replacement. Paranasal sinuses are largely clear. No mastoid effusion. Other: 1.1 cm T2 hypointense lesion within the left parotid gland, indeterminate. IMPRESSION: 1. No acute intracranial infarct or other abnormality. 2. Moderately advanced chronic microvascular ischemic disease, with a few small remote cerebellar infarcts. 3. 1.1 cm T2 hypointense lesion within the left parotid gland, indeterminate. Nonemergent outpatient ENT referral could be performed for further evaluation as warranted. Electronically Signed   By: Rise MuBenjamin  McClintock M.D.   On: 04/20/2022 01:09   CT ANGIO HEAD NECK W WO CM (CODE STROKE)  Result Date: 04/19/2022 CLINICAL DATA:  Initial evaluation for acute TIA, dizziness, slurred speech, now resolved. EXAM: CT ANGIOGRAPHY HEAD AND NECK TECHNIQUE: Multidetector CT imaging of the head and neck was performed using the standard protocol during bolus administration of intravenous contrast. Multiplanar CT image reconstructions and MIPs were obtained to evaluate the vascular anatomy. Carotid stenosis measurements (when applicable) are obtained utilizing NASCET criteria, using the distal internal carotid diameter as the denominator. RADIATION DOSE REDUCTION: This exam was performed according to the departmental dose-optimization program which includes automated exposure control, adjustment of the mA and/or kV  according to patient size and/or use of iterative reconstruction technique. CONTRAST:  50mL OMNIPAQUE IOHEXOL 350 MG/ML SOLN COMPARISON:  Comparison made with prior noncontrast head CT performed earlier the same day. FINDINGS: CTA NECK FINDINGS Aortic arch: Visualized aortic arch normal caliber with standard 3 vessel branching. Moderate atheromatous change about the arch itself. No significant stenosis about the origin the great vessels. Right carotid system: Right common and internal carotid arteries are tortuous but widely patent without significant stenosis or dissection. Mild for age atheromatous change about the right carotid bulb. Left carotid system: Left common and internal carotid arteries are tortuous but widely patent without stenosis or dissection. Mild for age atheromatous change about the left carotid bulb. Vertebral arteries: Both vertebral arteries arise from subclavian arteries. No proximal subclavian artery stenosis. Vertebral arteries patent without stenosis or dissection. Skeleton: No discrete or worrisome osseous lesions. Mild for age cervical spondylosis without stenosis. Patient is edentulous. Other neck: No other acute soft tissue abnormality within the neck. 1.2 cm well-circumscribed soft tissue nodule present within the left parotid gland, indeterminate. Asymmetric prominence of the right retropharyngeal fat versus superimposed lipoma noted adjacent to the right carotid bifurcation. 9 mm left thyroid nodule noted, of doubtful significance given size and patient age, no follow-up imaging recommended (ref: J Am Coll Radiol. 2015 Feb;12(2): 143-50). Upper chest: Visualized upper chest demonstrates no acute finding. Review of the MIP images confirms the above findings CTA HEAD FINDINGS Anterior circulation: Petrous segments patent bilaterally. Scattered atheromatous change seen within the carotid siphons with associated mild to moderate narrowing, left slightly worse than right. A1 segments  patent. Normal anterior communicating artery complex. Anterior cerebral arteries patent without stenosis. No M1 stenosis or occlusion. No proximal MCA branch occlusion. Distal MCA branches perfused and symmetric. Posterior circulation: Mild nonstenotic plaque noted within the mid left V4 segment. Both V4 segments patent without significant stenosis. Left vertebral artery dominant. Both PICA patent. Basilar patent to its distal aspect without stenosis. Superior cerebral arteries patent bilaterally. Both PCAs primarily supplied via the basilar well perfused or distal aspects. Venous sinuses: Grossly patent allowing for timing the contrast bolus. Anatomic variants: None significant.  No aneurysm. Review of the  MIP images confirms the above findings IMPRESSION: 1. Negative CTA for large vessel occlusion or other emergent finding. 2. Mild for age atheromatous change about the carotid bifurcations and carotid siphons without hemodynamically significant stenosis. 3. 1.2 cm left parotid nodule, indeterminate. Nonemergent outpatient ENT referral could be performed for further evaluation as warranted. 4.  Aortic Atherosclerosis (ICD10-I70.0). These results were communicated to Dr. Wilford Corner at 7:36 pm on 04/19/2022 by text page via the [facility] messaging system. Electronically Signed   By: Rise Mu M.D.   On: 04/19/2022 19:49   CT HEAD CODE STROKE WO CONTRAST  Result Date: 04/19/2022 CLINICAL DATA:  Code stroke. EXAM: CT HEAD WITHOUT CONTRAST TECHNIQUE: Contiguous axial images were obtained from the base of the skull through the vertex without intravenous contrast. RADIATION DOSE REDUCTION: This exam was performed according to the departmental dose-optimization program which includes automated exposure control, adjustment of the mA and/or kV according to patient size and/or use of iterative reconstruction technique. COMPARISON:  Prior CT from 07/08/2004. FINDINGS: Brain: Generalized age-related cerebral atrophy. Patchy  hypodensity involving the supratentorial cerebral white matter most likely related chronic microvascular ischemic disease, moderate to advanced in nature. No acute intracranial hemorrhage. No acute large vessel territory infarct. No mass lesion, mass effect or midline shift. No hydrocephalus or extra-axial fluid collection. Vascular: No hyperdense vessel. Calcified atherosclerosis present at skull base. Skull: Scalp soft tissues and calvarium within normal limits. Sinuses/Orbits: Globes and orbital soft tissues demonstrate no acute finding. Paranasal sinuses are largely clear. No mastoid effusion. Other: None. ASPECTS [facility] Stroke Program Early CT Score) - Ganglionic level infarction (caudate, lentiform nuclei, internal capsule, insula, M1-M3 cortex): 7 - Supraganglionic infarction (M4-M6 cortex): 3 Total score (0-10 with 10 being normal): 10 IMPRESSION: 1. No acute intracranial abnormality. 2. ASPECTS is 10. 3. Age-related cerebral atrophy with moderate to advanced chronic small vessel ischemic disease. These results were communicated to Dr. Wilford Corner at 7:18 pm on 04/19/2022 by text page via the [facility] messaging system. Electronically Signed   By: Rise Mu M.D.   On: 04/19/2022 19:20    PHYSICAL EXAM General: Alert, well-nourished, well-developed patient in no acute distress Respiratory:  Regular, unlabored respirations on room air  NEURO:  Mental Status: AA&Ox3  Speech/Language: speech is without dysarthria or aphasia.  Fluency, and comprehension intact.  Cranial Nerves:  II: PERRL. Visual fields full.  III, IV, VI: EOMI. Eyelids elevate symmetrically.  V: Sensation is intact to light touch and symmetrical to face.  VII: Smile is symmetrical. VIII: hearing intact to voice. IX, X: Phonation is normal.  XII: tongue is midline without fasciculations. Motor: 5/5 strength to all muscle groups tested.  Sensation- Diminished sensation in the left foot-this is baseline  Coordination: FTN  intact bilaterally.No drift.  Gait- deferred   ASSESSMENT/PLAN Ms. [name] is a 81 y.o. female with history of CAD, HTN, HLD, ischemic cardiomyopathy, CHF and emphysema presenting with an acute episode of blurred vision, slurred speech and dizziness which lasted about ten minutes.  Symptoms resolved spontaneously, and MRI was negative for acute stroke.  TIA: Left hemispheric TIA likely from small vessel disease in the setting of multiple risk factors Code Stroke CT head No acute abnormality. Small vessel disease. Atrophy. ASPECTS 10.    CTA head & neck no LVO or other emergent finding MRI  No acute intracranial infarct, chronic microvascular ischemic disease and small remote cerebellar infarcts 2D Echo EF 25-20%, akinesis of distal anteroseptal wall, grade 1 diastolic dysfunction, no atrial level shunt  LDL 40 HgbA1c 6.5 VTE prophylaxis - SCDs    Diet   Diet Heart Room service appropriate? Yes; Fluid consistency: Thin   No antithrombotic prior to admission, now on aspirin 81 mg daily and clopidogrel 75 mg daily for three weeks then aspirin alone.  Therapy recommendations:  pending Disposition:  pending  Hypertension Home meds:  none Stable Permissive hypertension (OK if < 220/120) but gradually normalize in 5-7 days Long-term BP goal normotensive  Hyperlipidemia Home meds:  rosuvastatin 40 mg daily, resumed in hospital LDL 40, goal < 70 Continue statin at discharge  Other Stroke Risk Factors Advanced Age >/= 66  Former Cigarette smoker Obesity, Body mass index is 31.22 kg/m., BMI >/= 30 associated with increased stroke risk, recommend weight loss, diet and exercise as appropriate  Congestive heart failure  Other Active Problems T2 hypointense lesion in left parotid gland Recommend outpatient ENT follow up CHF  Hospital day # 0  Cortney E Ernestina Columbia , MSN, AGACNP-BC [facility] See Amion for schedule and pager information 04/20/2022 1:13 PM  I have  personally obtained history,examined this patient, reviewed notes, independently viewed imaging studies, participated in medical decision making and plan of care.ROS completed by me personally and pertinent positives fully documented  I have made any additions or clarifications directly to the above note. Agree with note above.  She presented with transient blurred vision and speech difficulties likely due to TIA and neurological exam and brain MRI unremarkable.  Recommend aspirin and Plavix for 3 weeks followed by aspirin alone and aggressive risk factor modification.   The duration of this a  visit was 35 minutes of face-to-face time with the patient. Greater than 50% of this time was spent in counseling, explanation of diagnosis of TIA, planning of further management, and coordination of care.      Delia Heady, MD Medical Director [facility] St[facility] Center Pager: [phone] 04/20/2022 4:27 PM   To contact Stroke Continuity provider, please refer to [email or web address]. After hours, contact General Neurology

## 2022-04-29 LAB — I-STAT CHEM 8, ED
BUN: 19 mg/dL (ref 8–23)
Calcium, Ion: 1.02 mmol/L — ABNORMAL LOW (ref 1.15–1.40)
Chloride: 104 mmol/L (ref 98–111)
Creatinine, Ser: 1.2 mg/dL — ABNORMAL HIGH (ref 0.44–1.00)
Glucose, Bld: 101 mg/dL — ABNORMAL HIGH (ref 70–99)
HCT: 49 % — ABNORMAL HIGH (ref 36.0–46.0)
Hemoglobin: 16.7 g/dL — ABNORMAL HIGH (ref 12.0–15.0)
Potassium: 6.8 mmol/L (ref 3.5–5.1)
Sodium: 140 mmol/L (ref 135–145)
TCO2: 30 mmol/L (ref 22–32)

## 2022-05-21 ENCOUNTER — Encounter: Payer: Self-pay | Admitting: Neurology

## 2022-05-21 ENCOUNTER — Ambulatory Visit (INDEPENDENT_AMBULATORY_CARE_PROVIDER_SITE_OTHER): Payer: Medicare Other | Admitting: Neurology

## 2022-05-21 VITALS — BP 132/88 | HR 76 | Ht 65.0 in | Wt 182.5 lb

## 2022-05-21 DIAGNOSIS — G459 Transient cerebral ischemic attack, unspecified: Secondary | ICD-10-CM | POA: Diagnosis not present

## 2022-05-21 DIAGNOSIS — K118 Other diseases of salivary glands: Secondary | ICD-10-CM | POA: Diagnosis not present

## 2022-05-21 NOTE — Progress Notes (Signed)
[facility]  PATIENT: [name] DOB: June 27, 1941  REQUESTING CLINICIAN: Zigmund Daniel.,* HISTORY FROM: Patient and chart review  REASON FOR VISIT: TIA    HISTORICAL  CHIEF COMPLAINT:  Chief Complaint  Patient presents with   New Patient (Initial Visit)    Rm 15. Alone. NP/internal ED referral for TIA.    HISTORY OF PRESENT ILLNESS:  This is a 81 year old woman with vascular risk factors including coronary artery disease, hypertension, hyperlipidemia, heart failure, who is presenting after being admitted to hospital for TIA symptoms.  Patient reports on July 2 she was at home talking to her son and grandchildren when she had a sudden onset of blurry vision and difficulty speaking.  Episode lasted less than 10 minutes.  When EMS arrived at the house, patient episode subsided.  She did presented to the hospital had a full TIA work-up.  During the work-up she was found to have a nodule in the parotid and is pending a ENT follow-up.  She completed DAPT, aspirin and Plavix for total of 21 days and currently on aspirin alone.  She denies any previous similar episode and symptoms and reports since being discharged from the hospital she has not had any additional episode.  He is back to her normal self.  OTHER MEDICAL CONDITIONS: Systolic heart failure, CAD, hypertension, hyperlipidemia, obesity   REVIEW OF SYSTEMS: Full 14 system review of systems performed and negative with exception of: as noted in the HPI   ALLERGIES: No Known Allergies  HOME MEDICATIONS: Outpatient Medications Prior to Visit  Medication Sig Dispense Refill   acetaminophen-codeine (TYLENOL #2) 300-15 MG tablet Take 1 tablet by mouth 2 (two) times daily as needed for moderate pain.     albuterol (PROVENTIL HFA;VENTOLIN HFA) 108 (90 BASE) MCG/ACT inhaler Inhale 1-2 puffs into the lungs every 6 (six) hours as needed for wheezing.     aspirin 81 MG chewable tablet Chew 1 tablet (81 mg  total) by mouth daily. 30 tablet 11   BIDIL 20-37.5 MG tablet TAKE 1 TABLET BY MOUTH THREE TIMES DAILY (Patient taking differently: Take 1 tablet by mouth 3 (three) times daily.) 270 tablet 3   budesonide-formoterol (SYMBICORT) 160-4.5 MCG/ACT inhaler Inhale 2 puffs into the lungs 2 (two) times daily.     carvedilol (COREG) 12.5 MG tablet Take 1 tablet (12.5 mg total) by mouth 2 (two) times daily. 180 tablet 3   famotidine (PEPCID) 20 MG tablet Take 1 tablet (20 mg total) by mouth 2 (two) times daily. Take one tablet twice daily for two days 10 tablet 0   furosemide (LASIX) 40 MG tablet Take 1 tablet (40 mg total) by mouth daily. Please keep upcoming appt. In April with Dr. Excell Seltzer in order to receive future refills. Thank You. (Patient taking differently: Take 40 mg by mouth daily.) 45 tablet 0   Multiple Vitamin (MULTIVITAMIN) tablet Take 1 tablet by mouth daily.     nitroGLYCERIN (NITROSTAT) 0.4 MG SL tablet PLACE 1 TABLET UNDER TONGUE EVERY 5 MINUTES AS NEEDED FOR CHEST PAIN (Patient taking differently: Place 0.4 mg under the tongue every 5 (five) minutes as needed for chest pain.) 75 tablet 0   Omega 3 1000 MG CAPS Take 1 capsule by mouth daily.     rosuvastatin (CRESTOR) 40 MG tablet Take 1 tablet (40 mg total) by mouth daily. 30 tablet 0   sacubitril-valsartan (ENTRESTO) 49-51 MG Take 1 tablet by mouth 2 (two) times daily. 180 tablet 3  spironolactone (ALDACTONE) 25 MG tablet TAKE 1 TABLET(25 MG) BY MOUTH DAILY (Patient taking differently: Take 25 mg by mouth daily.) 30 tablet 0   tiotropium (SPIRIVA) 18 MCG inhalation capsule Place 18 mcg into inhaler and inhale daily.     Turmeric (QC TUMERIC COMPLEX) 500 MG CAPS Take 1 capsule by mouth daily. Patient taking 400 mg daily     No facility-administered medications prior to visit.    PAST MEDICAL HISTORY: Past Medical History:  Diagnosis Date   CAD (coronary artery disease)    s/p NSTEMI 2009 - s/p CABG 2009: L-LAD, S-OM1 and OM2, S-Dx,  S-PDA.;  Myoview 11/04/11:  Small inf scar, mod ant apical and septal scar, no ischemia, EF 31%.// Myoview 10/2018:  EF 46, ant-sept and apical infarct; no ischemia, apical AK; Low Risk    Cardiomyopathy, ischemic    a.  LVEF 35-40% // b. Echo 5/14: Moderate LVH, EF 30-35%, mid to distal ant-septal, apical HK // c. Echo  9/14: Mild focal basal septal hypertrophy, EF 35-40%, anteroseptal and apical HK, grade 2 diastolic dysfunction, mild MR, reduced RV systolic function, moderate TR, PASP 47.       Chronic systolic heart failure (HCC)    HIT (heparin-induced thrombocytopenia) (HCC)    HLD (hyperlipidemia)    HTN (hypertension)    Other emphysema (HCC)     PAST SURGICAL HISTORY: Past Surgical History:  Procedure Laterality Date   CORONARY ARTERY BYPASS GRAFT  November 21, 2007   5 vessel    FAMILY HISTORY: Family History  Problem Relation Age of Onset   Asthma Mother    Hypertension Mother    Diabetes Mother    Rheum arthritis Mother     SOCIAL HISTORY: Social History   Socioeconomic History   Marital status: Legally Separated    Spouse name: Not on file   Number of children: Not on file   Years of education: Not on file   Highest education level: Not on file  Occupational History   Occupation: does not currently work  Tobacco Use   Smoking status: Former    Packs/day: 1.00    Years: 44.00    Total pack years: 44.00    Types: Cigarettes    Quit date: 10/20/2007    Years since quitting: 14.5   Smokeless tobacco: Never   Tobacco comments:    started at age 62s.   Vaping Use   Vaping Use: Never used  Substance and Sexual Activity   Alcohol use: No   Drug use: No   Sexual activity: Not on file  Other Topics Concern   Not on file  Social History Narrative   Pt lives alone in [place].    Children live nearby   Social Determinants of Health   Financial Resource Strain: Not on file  Food Insecurity: Not on file  Transportation Needs: Not on file  Physical  Activity: Not on file  Stress: Not on file  Social Connections: Not on file  Intimate Partner Violence: Not on file    PHYSICAL EXAM  GENERAL EXAM/CONSTITUTIONAL: Vitals:  Vitals:   05/21/22 1119  BP: 132/88  Pulse: 76  Weight: 182 lb 8 oz (82.8 kg)  Height: 5\' 5"  (1.651 m)   Body mass index is 30.37 kg/m. Wt Readings from Last 3 Encounters:  05/21/22 182 lb 8 oz (82.8 kg)  04/19/22 187 lb 9.8 oz (85.1 kg)  02/02/22 183 lb 12.8 oz (83.4 kg)   Patient is in no distress; well developed,  nourished and groomed; neck is supple  EYES: Pupils round and reactive to light, Visual fields full to confrontation, Extraocular movements intacts,   MUSCULOSKELETAL: Gait, strength, tone, movements noted in Neurologic exam below  NEUROLOGIC: MENTAL STATUS:      No data to display         awake, alert, oriented to person, place and time recent and remote memory intact normal attention and concentration language fluent, comprehension intact, naming intact fund of knowledge appropriate  CRANIAL NERVE:  2nd, 3rd, 4th, 6th - pupils equal and reactive to light, visual fields full to confrontation, extraocular muscles intact, no nystagmus 5th - facial sensation symmetric 7th - facial strength symmetric 8th - hearing intact 9th - palate elevates symmetrically, uvula midline 11th - shoulder shrug symmetric 12th - tongue protrusion midline  MOTOR:  normal bulk and tone, full strength in the BUE, BLE  SENSORY:  normal and symmetric to light touch  COORDINATION:  finger-nose-finger, fine finger movements normal  GAIT/STATION:  Antalgic gait, walks with a cane    DIAGNOSTIC DATA (LABS, IMAGING, TESTING) - I reviewed patient records, labs, notes, testing and imaging myself where available.  Lab Results  Component Value Date   WBC 11.1 (H) 04/19/2022   HGB 16.7 (H) 04/19/2022   HCT 49.0 (H) 04/19/2022   MCV 89.9 04/19/2022   PLT 96 (L) 04/19/2022      Component Value  Date/Time   NA 140 04/19/2022 1900   NA 143 02/02/2022 1047   K 3.5 04/20/2022 0117   CL 104 04/19/2022 1900   CO2 27 04/19/2022 1850   GLUCOSE 101 (H) 04/19/2022 1900   BUN 19 04/19/2022 1900   BUN 12 02/02/2022 1047   CREATININE 1.20 (H) 04/19/2022 1900   CREATININE 1.12 (H) 07/13/2016 1306   CALCIUM 8.8 (L) 04/19/2022 1850   PROT 7.1 04/19/2022 1850   PROT 6.6 02/02/2022 1047   ALBUMIN 3.6 04/19/2022 1850   ALBUMIN 3.9 02/02/2022 1047   AST 15 04/19/2022 1850   ALT 8 04/19/2022 1850   ALKPHOS 74 04/19/2022 1850   BILITOT 1.1 04/19/2022 1850   BILITOT 0.4 02/02/2022 1047   GFRNONAA 46 (L) 04/19/2022 1850   GFRAA 44 (L) 06/14/2019 1250   Lab Results  Component Value Date   CHOL 88 04/20/2022   HDL 35 (L) 04/20/2022   LDLCALC 40 04/20/2022   TRIG 63 04/20/2022   CHOLHDL 2.5 04/20/2022   Lab Results  Component Value Date   HGBA1C 6.5 (H) 04/19/2022   Lab Results  Component Value Date   VITAMINB12 174 (L) 04/10/2020   Lab Results  Component Value Date   TSH 3.720 10/20/2017    MRI Brain 04/20/2022 1. No acute intracranial infarct or other abnormality. 2. Moderately advanced chronic microvascular ischemic disease, with a few small remote cerebellar infarcts. 3. 1.1 cm T2 hypointense lesion within the left parotid gland, indeterminate. Nonemergent outpatient ENT referral could be performed for further evaluation as warranted.  CTA Head and Neck 04/19/2022 1. Negative CTA for large vessel occlusion or other emergent finding. 2. Mild for age atheromatous change about the carotid bifurcations and carotid siphons without hemodynamically significant stenosis. 3. 1.2 cm left parotid nodule, indeterminate. Nonemergent outpatient ENT referral could be performed for further evaluation as warranted. 4.  Aortic Atherosclerosis (ICD10-I70.0).  Echo 04/20/22 1. Akinesis of the distal anteroseptal wall, apex and distal inferior  wall with overall severe LV dysfunction.  2. Left  ventricular ejection fraction, by estimation, is 25 to 30%.  The left ventricle has severely decreased function. The left ventricle demonstrates regional wall motion abnormalities (see scoring diagram/findings for description). Left ventricular  diastolic parameters are consistent with Grade I diastolic dysfunction (impaired relaxation).  3. Right ventricular systolic function is normal. The right ventricular size is normal.   ASSESSMENT AND PLAN  81 y.o. year old female with vascular risk factors including coronary artery disease, systolic heart failure, hypertension, hyperlipidemia, obesity who is presenting after 10-minutes history of blurred vision and slurred speech.  TIA work-up completed, no stroke found and patient completed DAPT for 3 weeks.  She denies any additional similar symptoms.  At this time we will continue patient on current medications; during her work-up, she was also found to have a parotid gland nodule, she is pending a ENT follow-up.  Continue to follow-up with your PCP, follow-up with ENT as scheduled and return here in 1 year or sooner if worse.   1. TIA (transient ischemic attack)   2. Parotid nodule      Patient Instructions  Continue current medications  Follow-up with ENT as scheduled Return in 1 year or sooner if worse  No orders of the defined types were placed in this encounter.   No orders of the defined types were placed in this encounter.   Return in about 1 year (around 05/22/2023).    Windell Norfolk, MD 05/21/2022, 11:53 AM  [facility] [address] [place], [place] [address] [phone]

## 2022-05-21 NOTE — Patient Instructions (Signed)
Continue current medications  Follow-up with ENT as scheduled Return in 1 year or sooner if worse

## 2022-08-04 ENCOUNTER — Ambulatory Visit (HOSPITAL_COMMUNITY): Payer: Medicare Other | Attending: Cardiovascular Disease

## 2022-08-04 DIAGNOSIS — I5022 Chronic systolic (congestive) heart failure: Secondary | ICD-10-CM | POA: Diagnosis present

## 2022-08-04 LAB — ECHOCARDIOGRAM COMPLETE
Area-P 1/2: 3.65 cm2
S' Lateral: 3.2 cm

## 2022-08-04 MED ORDER — PERFLUTREN LIPID MICROSPHERE
1.0000 mL | INTRAVENOUS | Status: AC | PRN
  Administered 2022-08-04: 1 mL via INTRAVENOUS

## 2022-09-07 NOTE — Progress Notes (Unsigned)
Cardiology Office Note:    Date:  09/08/2022   ID:  [name], [name] [birth year], MRN [id number]  PCP:  System, Provider Not In   [facility] Providers Cardiologist:  Tonny Bollman, MD {  Referring MD: No ref. provider found   Chief Complaint  Patient presents with   Follow-up    Follow-up after echocardiogram     History of Present Illness:    [name] is a 81 y.o. female with a hx of coronary artery disease s/p CABG 2009, chronic systolic CHF, hypertension, hyperlipidemia, COPD with chronic dyspnea, thrombocytopenia.  Patient is followed by Dr. Excell Seltzer and is seen today for follow up after recent echocardiogram.   Patient had an NSTEMI in 2009 and was found to have severe multivessel CAD on cath.  She underwent CABGx 5 on 11/21/2007 with LIMA-LAD, SVG-OM1 and OM 2, SVG-Dx, SVG-PDA.  Echocardiogram on 03/07/2013 showed EF 30-35%.  Seen by Dr. Graciela Husbands with EP for consideration of an ICD, however echocardiogram on 06/23/2013 showed that EF had improved to 35-40% and ICD was not put in.  Nuclear stress test on 11/03/2018 showed evidence of prior anteroseptal and apical infarct, no signs of ischemia, EF 46%.    She was last seen by Dr. Excell Seltzer on 02/02/2022.  Time, patient was doing well on Entresto, carvedilol, spironolactone, aspirin, Crestor .  EKG at that appointment showed PVCs, heart monitor worn in 02/2022 showed frequent PVCs with 9.4% burden.  Carvedilol was increased.  Admitted with a TIA in 04/2022, neurology recommended dual antiplatelet therapy for 3 weeks followed by aspirin alone.  Patient underwent echocardiogram on 08/04/2022 that showed EF 25-30% with regional wall motion abnormalities, mildly reduced RV systolic function, normal pulmonary artery systolic pressure.  Patient was scheduled for follow-up to discuss echocardiogram results.  Today, patient reports she has been doing well from a cardiac standpoint.  Denies having chest pain.  Occasionally has indigestion that  feels like a burning in her chest.  She notices that she has indigestion after eating greasy or acidic food like pizza, spaghetti.  Symptoms are relieved by taking an antacid or drinking ginger ale.  She denies having exertional chest pain. Denies recent decline in her functional status/physical activities.  She is able to walk around her home and go grocery shopping and denies any chest pain or shortness of breath with these activities.  Does have some shortness of breath on exertion which is chronic, has not worsened in the past several months. In fact, her breathing has improved since she started a new inhaler. Denies orthopnea, PND, ankle edema.  Since having her TIA in July, she has noticed some mild ongoing dizziness.  Dizziness has not worsened since her TIA.  Denies episodes of syncope. Denies dizziness today.  She has an appointment with neurology on the 29th.   Past Medical History:  Diagnosis Date   CAD (coronary artery disease)    s/p NSTEMI 2009 - s/p CABG 2009: L-LAD, S-OM1 and OM2, S-Dx, S-PDA.;  Myoview 11/04/11:  Small inf scar, mod ant apical and septal scar, no ischemia, EF 31%.// Myoview 10/2018:  EF 46, ant-sept and apical infarct; no ischemia, apical AK; Low Risk    Cardiomyopathy, ischemic    a.  LVEF 35-40% // b. Echo 5/14: Moderate LVH, EF 30-35%, mid to distal ant-septal, apical HK // c. Echo  9/14: Mild focal basal septal hypertrophy, EF 35-40%, anteroseptal and apical HK, grade 2 diastolic dysfunction, mild MR, reduced RV systolic function, moderate  TR, PASP 47.       Chronic systolic heart failure (HCC)    HIT (heparin-induced thrombocytopenia) (HCC)    HLD (hyperlipidemia)    HTN (hypertension)    Other emphysema (HCC)     Past Surgical History:  Procedure Laterality Date   CORONARY ARTERY BYPASS GRAFT  November 21, 2007   5 vessel    Current Medications: Current Meds  Medication Sig   acetaminophen-codeine (TYLENOL #2) 300-15 MG tablet Take 1 tablet by mouth 2  (two) times daily as needed for moderate pain.   albuterol (PROVENTIL HFA;VENTOLIN HFA) 108 (90 BASE) MCG/ACT inhaler Inhale 1-2 puffs into the lungs every 6 (six) hours as needed for wheezing.   aspirin 81 MG chewable tablet Chew 1 tablet (81 mg total) by mouth daily.   BIDIL 20-37.5 MG tablet TAKE 1 TABLET BY MOUTH THREE TIMES DAILY (Patient taking differently: Take 1 tablet by mouth 3 (three) times daily.)   carvedilol (COREG) 12.5 MG tablet Take 1 tablet (12.5 mg total) by mouth 2 (two) times daily.   empagliflozin (JARDIANCE) 10 MG TABS tablet Take 1 tablet (10 mg total) by mouth daily before breakfast.   famotidine (PEPCID) 20 MG tablet Take 1 tablet (20 mg total) by mouth 2 (two) times daily. Take one tablet twice daily for two days   Fluticasone-Umeclidin-Vilant (TRELEGY ELLIPTA) 200-62.5-25 MCG/ACT AEPB Inhale 1 puff into the lungs daily.   furosemide (LASIX) 40 MG tablet Take 1 tablet (40 mg total) by mouth daily. Please keep upcoming appt. In April with Dr. Excell Seltzer in order to receive future refills. Thank You. (Patient taking differently: Take 40 mg by mouth daily.)   Multiple Vitamin (MULTIVITAMIN) tablet Take 1 tablet by mouth daily.   nitroGLYCERIN (NITROSTAT) 0.4 MG SL tablet PLACE 1 TABLET UNDER TONGUE EVERY 5 MINUTES AS NEEDED FOR CHEST PAIN (Patient taking differently: Place 0.4 mg under the tongue every 5 (five) minutes as needed for chest pain.)   Omega 3 1000 MG CAPS Take 1 capsule by mouth daily.   rosuvastatin (CRESTOR) 40 MG tablet Take 1 tablet (40 mg total) by mouth daily.   sacubitril-valsartan (ENTRESTO) 49-51 MG Take 1 tablet by mouth 2 (two) times daily.   spironolactone (ALDACTONE) 25 MG tablet TAKE 1 TABLET(25 MG) BY MOUTH DAILY (Patient taking differently: Take 25 mg by mouth daily.)   Turmeric (QC TUMERIC COMPLEX) 500 MG CAPS Take 1 capsule by mouth daily. Patient taking 400 mg daily   [DISCONTINUED] budesonide-formoterol (SYMBICORT) 160-4.5 MCG/ACT inhaler Inhale 2  puffs into the lungs 2 (two) times daily.   [DISCONTINUED] tiotropium (SPIRIVA) 18 MCG inhalation capsule Place 18 mcg into inhaler and inhale daily.     Allergies:   Patient has no known allergies.   Social History   Socioeconomic History   Marital status: Legally Separated    Spouse name: Not on file   Number of children: Not on file   Years of education: Not on file   Highest education level: Not on file  Occupational History   Occupation: does not currently work  Tobacco Use   Smoking status: Former    Packs/day: 1.00    Years: 44.00    Total pack years: 44.00    Types: Cigarettes    Quit date: 10/20/2007    Years since quitting: 14.8   Smokeless tobacco: Never   Tobacco comments:    started at age 72s.   Vaping Use   Vaping Use: Never used  Substance and Sexual Activity  Alcohol use: No   Drug use: No   Sexual activity: Not on file  Other Topics Concern   Not on file  Social History Narrative   Pt lives alone in [place].    Children live nearby   Social Determinants of Health   Financial Resource Strain: Not on file  Food Insecurity: Not on file  Transportation Needs: Not on file  Physical Activity: Not on file  Stress: Not on file  Social Connections: Not on file     Family History: The patient's family history includes Asthma in her mother; Diabetes in her mother; Hypertension in her mother; Rheum arthritis in her mother.  ROS:   Please see the history of present illness.     All other systems reviewed and are negative.  EKGs/Labs/Other Studies Reviewed:    The following studies were reviewed today:  Echocardiogram 08/04/2022   1. Left ventricular ejection fraction, by estimation, is 25 to 30%. The  left ventricle has severely decreased function. The left ventricle  demonstrates regional wall motion abnormalities (see scoring  diagram/findings for description). There is mild  concentric left ventricular hypertrophy. Left ventricular diastolic   parameters are indeterminate.   2. Right ventricular systolic function is mildly reduced. The right  ventricular size is normal. There is normal pulmonary artery systolic  pressure.   3. The mitral valve is grossly normal. Trivial mitral valve  regurgitation. No evidence of mitral stenosis.   4. The aortic valve is grossly normal. There is mild calcification of the  aortic valve. There is mild thickening of the aortic valve. Aortic valve  regurgitation is not visualized. Aortic valve sclerosis is present, with  no evidence of aortic valve  stenosis.   5. The inferior vena cava is normal in size with <50% respiratory  variability, suggesting right atrial pressure of 8 mmHg.   6. Cannot exclude a small PFO.   Comparison(s): No significant change from prior study.   Conclusion(s)/Recommendation(s): No left ventricular mural or apical  thrombus/thrombi.   Echocardiogram 04/20/2022  1. Akinesis of the distal anteroseptal wall, apex and distal inferior  wall with overall severe LV dysfunction.   2. Left ventricular ejection fraction, by estimation, is 25 to 30%. The  left ventricle has severely decreased function. The left ventricle  demonstrates regional wall motion abnormalities (see scoring  diagram/findings for description). Left ventricular  diastolic parameters are consistent with Grade I diastolic dysfunction  (impaired relaxation).   3. Right ventricular systolic function is normal. The right ventricular  size is normal.   4. The mitral valve is normal in structure. Trivial mitral valve  regurgitation. No evidence of mitral stenosis.   5. The aortic valve is tricuspid. Aortic valve regurgitation is not  visualized. Aortic valve sclerosis is present, with no evidence of aortic  valve stenosis.   6. The inferior vena cava is normal in size with greater than 50%  respiratory variability, suggesting right atrial pressure of 3 mmHg.   Long Term Monitor 02/17/2022  SUMMARY: The  basic rhythm is normal sinus with an average HR of 78 bpm. There are frequent PVC's occurring at a burden of 9.4%. There are rare, short ventricular and supraventricular runs last 4-6 beats. No sustained arrhythmia. No AFib or AFlutter. No pauses or bradycardic events.   Nuclear Stress Test 11/03/2018  The left ventricular ejection fraction is mildly decreased (45-54%). Nuclear stress EF: 46%. There was no ST segment deviation noted during stress. Defect 1: There is a large defect of severe  severity present in the mid anteroseptal, apical anterior, apical septal, apical inferior, apical lateral and apex location. This is a low risk study. Findings consistent with prior myocardial infarction.   Abnormal, low risk stress nuclear study with prior anteroseptal and apical infarct; no ischemia; EF 46 with akinesis of the apex.  Recent Labs: 04/19/2022: ALT 8; BUN 19; Creatinine, Ser 1.20; Hemoglobin 16.7; Platelets 96; Sodium 140 04/20/2022: Potassium 3.5  Recent Lipid Panel    Component Value Date/Time   CHOL 88 04/20/2022 0344   CHOL 98 (L) 02/02/2022 1047   TRIG 63 04/20/2022 0344   HDL 35 (L) 04/20/2022 0344   HDL 41 02/02/2022 1047   CHOLHDL 2.5 04/20/2022 0344   VLDL 13 04/20/2022 0344   LDLCALC 40 04/20/2022 0344   LDLCALC 40 02/02/2022 1047     Risk Assessment/Calculations:                Physical Exam:    VS:  BP 134/72   Pulse 68   Ht 5\' 5"  (1.651 m)   Wt 183 lb (83 kg)   SpO2 96%   BMI 30.45 kg/m     Wt Readings from Last 3 Encounters:  09/08/22 183 lb (83 kg)  05/21/22 182 lb 8 oz (82.8 kg)  04/19/22 187 lb 9.8 oz (85.1 kg)     GEN: Well nourished, well developed in no acute distress. Sitting comfortably on the table  HEENT: Normal NECK: No JVD with head of bed elevated to 30 degrees; No carotid bruits LYMPHATICS: No lymphadenopathy CARDIAC: RRR, no murmurs, rubs, gallops. Radial pulses 2+ bilaterally  RESPIRATORY:  Crackles in bilateral lung bases,  otherwise clear to auscultation. Normal WOB on room air  ABDOMEN: Soft, non-tender, non-distended MUSCULOSKELETAL:  No edema; No deformity  SKIN: Warm and dry NEUROLOGIC:  Alert and oriented x 3 PSYCHIATRIC:  Normal affect   ASSESSMENT:    1. Chronic systolic heart failure (HCC)   2. Coronary artery disease involving native coronary artery of native heart without angina pectoris   3. Frequent PVCs   4. Essential hypertension   5. Mixed hyperlipidemia    PLAN:    In order of problems listed above:  Chronic systolic heart failure - Recent echocardiogram from 08/04/2022 showed 25-30% regional wall motion abnormalities, mild LVH, mildly reduced RV systolic function. Noted that EF was 30% in 2021, 35-40% in 2014  - GDMT includes carvedilol 12.5 mg BID, entresto 49-51 mg BID, spironolactone 25 mg daily, BiDil 20-37.5 mg TID  - Patient has chronic SOB on exertion, but reports that her breathing has been improving since she changed inhalers. Denies worsening SOB. Denies orthopnea, ankle edema, chest pain - Patient is euvolemic on exam today  - Start Jardiance 10 mg daily. Discussed with patient that this medication can be costly and to let 2015 know if she cannot afford to take it. If this occurs, we can either try to get her assistance or stop the medication. I also discussed side effects such as increased urination, increased risk of UTI, and hypotension. Patient would like to try Jardiance and will let us know if she has side effects  - Ordered BMP to assess electrolytes/renal function prior to starting jardiance and while on entresto, spiro. Also ordered BMP for 2 weeks from now to reassess renal function after starting jardiance   CAD s/p CABGx5 2009  - Most recent stress test from 10/2018 showed no evidence of ischemia, evidence of prior anteroseptal and apical infarct  - Patient  denies anginal symptoms  - Continue aspirin, crestor, BiDil   Frequent PVCs  - Cardiac monitor worn for 2 days  in 01/2022 showed frequent PVCs (9.4% burden). Patient is asymptomatic - EKG from 04/2022 was without PVCs   - Check TSH today (has not been checked since 2019)   - Continue carvedilol 12.5 mg BID   HTN  - Patient takes her BP at home and reports that her systolic BP is usually in the [id number].  - BP initially 148/72 today, down to 134/72 on my recheck - Continue current therapies and start jardiance as above   HLD  - Most recent lipid panel from 04/2022 showed total cholesterol 88, triglycerides 63, HDL 35, LDL 40. CMP 04/2022 showed stable liver function  - Continue crestor   History of TIA - Patient was admitted in 04/2022 with TIA. Per review of neurology's notes, they suspect that the TIA was likely secondary to small vessel disease. - CTA head and neck that admission showed carotid siphons without hemodynamically significant stenosis  - Echocardiogram from 08/04/2022 showed EF 25-30% with regional wall motion abnormalities (mid and distal anterior septum, mid inferoseptal segment, apical anterior segment, apical inferior segment, and apex were akinetic). There was no LV mural or apical thrombus - As there was no evidence of thrombus on echo, I do not think that there is a need to adjust antiplatelet therapy or add anticoagulation at this time. However, given recent TIA and apical akinesis, will confirm with Dr. Excell Seltzerooper  - Continue crestor, aspirin    Medication Adjustments/Labs and Tests Ordered: Current medicines are reviewed at length with the patient today.  Concerns regarding medicines are outlined above.  Orders Placed This Encounter  Procedures   Basic metabolic panel   Basic metabolic panel   TSH   Meds ordered this encounter  Medications   empagliflozin (JARDIANCE) 10 MG TABS tablet    Sig: Take 1 tablet (10 mg total) by mouth daily before breakfast.    Dispense:  90 tablet    Refill:  1    Patient Instructions  Medication Instructions:  Your physician has recommended  you make the following change in your medication:   START Jardiance 10 mg taking 1 daily   *If you need a refill on your cardiac medications before your next appointment, please call your pharmacy*   Lab Work: TODAY:  BMET & TSH  12/56/23:  COME ANYTIME AFTER 7:15 FOR A REPEAT:  BMET  If you have labs (blood work) drawn today and your tests are completely normal, you will receive your results only by: MyChart Message (if you have MyChart) OR A paper copy in the mail If you have any lab test that is abnormal or we need to change your treatment, we will call you to review the results.   Testing/Procedures: None ordered   Follow-Up: At [facility], you and your health needs are our priority.  As part of our continuing mission to provide you with exceptional heart care, we have created designated Provider Care Teams.  These Care Teams include your primary Cardiologist (physician) and Advanced Practice Providers (APPs -  Physician Assistants and Nurse Practitioners) who all work together to provide you with the care you need, when you need it.  We recommend signing up for the patient portal called "MyChart".  Sign up information is provided on this After Visit Summary.  MyChart is used to connect with patients for Virtual Visits (Telemedicine).  Patients are able to view lab/test  results, encounter notes, upcoming appointments, etc.  Non-urgent messages can be sent to your provider as well.   To learn more about what you can do with MyChart, go to [email or web address].    Your next appointment:   3 month(s)     12/08/22 arrive at 9:00  The format for your next appointment:   In Person  Provider:   Tonny Bollman, MD  or Tereso Newcomer, PA-C         Other Instructions   Important Information About Sugar         Signed, [name], PA-C  09/08/2022 9:52 AM    [facility]

## 2022-09-08 ENCOUNTER — Ambulatory Visit: Payer: Medicare Other | Attending: Physician Assistant | Admitting: Cardiology

## 2022-09-08 ENCOUNTER — Encounter: Payer: Self-pay | Admitting: Cardiology

## 2022-09-08 VITALS — BP 134/72 | HR 68 | Ht 65.0 in | Wt 183.0 lb

## 2022-09-08 DIAGNOSIS — E782 Mixed hyperlipidemia: Secondary | ICD-10-CM

## 2022-09-08 DIAGNOSIS — I5022 Chronic systolic (congestive) heart failure: Secondary | ICD-10-CM | POA: Diagnosis not present

## 2022-09-08 DIAGNOSIS — I2510 Atherosclerotic heart disease of native coronary artery without angina pectoris: Secondary | ICD-10-CM | POA: Diagnosis not present

## 2022-09-08 DIAGNOSIS — I493 Ventricular premature depolarization: Secondary | ICD-10-CM | POA: Diagnosis not present

## 2022-09-08 DIAGNOSIS — I10 Essential (primary) hypertension: Secondary | ICD-10-CM

## 2022-09-08 LAB — BASIC METABOLIC PANEL
BUN/Creatinine Ratio: 15 (ref 12–28)
BUN: 14 mg/dL (ref 8–27)
CO2: 24 mmol/L (ref 20–29)
Calcium: 8.9 mg/dL (ref 8.7–10.3)
Chloride: 107 mmol/L — ABNORMAL HIGH (ref 96–106)
Creatinine, Ser: 0.93 mg/dL (ref 0.57–1.00)
Glucose: 99 mg/dL (ref 70–99)
Potassium: 3.8 mmol/L (ref 3.5–5.2)
Sodium: 144 mmol/L (ref 134–144)
eGFR: 61 mL/min/{1.73_m2} (ref >59)

## 2022-09-08 LAB — TSH: TSH: 3.05 u[IU]/mL (ref 0.450–4.500)

## 2022-09-08 MED ORDER — EMPAGLIFLOZIN 10 MG PO TABS: 10.0000 mg | ORAL_TABLET | Freq: Every day | ORAL | 1 refills | Status: DC

## 2022-09-08 NOTE — Patient Instructions (Addendum)
Medication Instructions:  Your physician has recommended you make the following change in your medication:   START Jardiance 10 mg taking 1 daily   *If you need a refill on your cardiac medications before your next appointment, please call your pharmacy*   Lab Work: TODAY:  BMET & TSH  12/56/23:  COME ANYTIME AFTER 7:15 FOR A REPEAT:  BMET  If you have labs (blood work) drawn today and your tests are completely normal, you will receive your results only by: MyChart Message (if you have MyChart) OR A paper copy in the mail If you have any lab test that is abnormal or we need to change your treatment, we will call you to review the results.   Testing/Procedures: None ordered   Follow-Up: At [facility], you and your health needs are our priority.  As part of our continuing mission to provide you with exceptional heart care, we have created designated Provider Care Teams.  These Care Teams include your primary Cardiologist (physician) and Advanced Practice Providers (APPs -  Physician Assistants and Nurse Practitioners) who all work together to provide you with the care you need, when you need it.  We recommend signing up for the patient portal called "MyChart".  Sign up information is provided on this After Visit Summary.  MyChart is used to connect with patients for Virtual Visits (Telemedicine).  Patients are able to view lab/test results, encounter notes, upcoming appointments, etc.  Non-urgent messages can be sent to your provider as well.   To learn more about what you can do with MyChart, go to [email or web address].    Your next appointment:   3 month(s)     12/08/22 arrive at 9:00  The format for your next appointment:   In Person  Provider:   Tonny Bollman, MD  or Tereso Newcomer, PA-C         Other Instructions   Important Information About Sugar

## 2022-09-23 ENCOUNTER — Ambulatory Visit: Payer: Medicare Other | Attending: Cardiology

## 2022-09-23 DIAGNOSIS — I493 Ventricular premature depolarization: Secondary | ICD-10-CM

## 2022-09-23 DIAGNOSIS — I5022 Chronic systolic (congestive) heart failure: Secondary | ICD-10-CM

## 2022-09-23 LAB — BASIC METABOLIC PANEL
BUN/Creatinine Ratio: 13 (ref 12–28)
BUN: 15 mg/dL (ref 8–27)
CO2: 24 mmol/L (ref 20–29)
Calcium: 9.2 mg/dL (ref 8.7–10.3)
Chloride: 108 mmol/L — ABNORMAL HIGH (ref 96–106)
Creatinine, Ser: 1.13 mg/dL — ABNORMAL HIGH (ref 0.57–1.00)
Glucose: 94 mg/dL (ref 70–99)
Potassium: 4 mmol/L (ref 3.5–5.2)
Sodium: 147 mmol/L — ABNORMAL HIGH (ref 134–144)
eGFR: 48 mL/min/{1.73_m2} — ABNORMAL LOW (ref >59)

## 2022-09-29 ENCOUNTER — Telehealth: Payer: Self-pay | Admitting: Cardiovascular Disease

## 2022-09-29 DIAGNOSIS — R7989 Other specified abnormal findings of blood chemistry: Secondary | ICD-10-CM

## 2022-09-29 DIAGNOSIS — Z79899 Other long term (current) drug therapy: Secondary | ICD-10-CM

## 2022-09-29 NOTE — Telephone Encounter (Signed)
Patient is returning call to discuss lab results. 

## 2022-10-02 NOTE — Telephone Encounter (Signed)
Pt returning call to office to review labs done on 09/23/22:  [name], [name] 09/29/2022  7:45 AM EST     Please inform patient that her blood work showed that creatinine (measurement of kidney function) increased slightly after starting Jardiance. While this is to be expected, I would like to recheck a BMP in 2 weeks to ensure that her kidney function remains stable.   Please arrange BMP in 2 weeks. Thanks! KJ   Repeat BMET entered and scheduled for 10/09/22 per pt request.

## 2022-10-09 ENCOUNTER — Ambulatory Visit: Payer: Medicare Other | Attending: Cardiology

## 2022-10-09 DIAGNOSIS — R7989 Other specified abnormal findings of blood chemistry: Secondary | ICD-10-CM

## 2022-10-09 DIAGNOSIS — Z79899 Other long term (current) drug therapy: Secondary | ICD-10-CM

## 2022-10-10 LAB — BASIC METABOLIC PANEL
BUN/Creatinine Ratio: 10 — ABNORMAL LOW (ref 12–28)
BUN: 10 mg/dL (ref 8–27)
CO2: 24 mmol/L (ref 20–29)
Calcium: 9.2 mg/dL (ref 8.7–10.3)
Chloride: 107 mmol/L — ABNORMAL HIGH (ref 96–106)
Creatinine, Ser: 0.98 mg/dL (ref 0.57–1.00)
Glucose: 81 mg/dL (ref 70–99)
Potassium: 3.4 mmol/L — ABNORMAL LOW (ref 3.5–5.2)
Sodium: 145 mmol/L — ABNORMAL HIGH (ref 134–144)
eGFR: 57 mL/min/{1.73_m2} — ABNORMAL LOW (ref >59)

## 2022-10-14 ENCOUNTER — Telehealth: Payer: Self-pay

## 2022-10-14 DIAGNOSIS — Z79899 Other long term (current) drug therapy: Secondary | ICD-10-CM

## 2022-10-14 MED ORDER — POTASSIUM CHLORIDE CRYS ER 20 MEQ PO TBCR: 40.0000 meq | EXTENDED_RELEASE_TABLET | Freq: Once | ORAL | 0 refills | Status: DC

## 2022-10-14 NOTE — Telephone Encounter (Signed)
Reviewed results and recommendations with patient who agreed to plan. 40 MEQ of KCL ordered, BMET scheduled.

## 2022-10-14 NOTE — Telephone Encounter (Signed)
-----  Message from Margie Billet, [place] sent at 10/13/2022  2:21 PM EST ----- Please tell patient that her most recent BMP showed that her kidney function (creatinine, eGFR) has improved. However, K is slightly low at 3.4.   Give a one time dose of KCl 40 mEq. Will need a repeat BMP in 3 weeks. If K remains low at that time, we can think about having her take K supplementation regularly.   Margie Billet, PA-C 10/13/2022 2:19 PM

## 2022-11-04 ENCOUNTER — Ambulatory Visit: Payer: 59 | Attending: Cardiovascular Disease

## 2022-11-04 DIAGNOSIS — Z79899 Other long term (current) drug therapy: Secondary | ICD-10-CM

## 2022-11-05 ENCOUNTER — Telehealth: Payer: Self-pay | Admitting: Cardiovascular Disease

## 2022-11-05 LAB — BASIC METABOLIC PANEL
BUN/Creatinine Ratio: 12 (ref 12–28)
BUN: 11 mg/dL (ref 8–27)
CO2: 26 mmol/L (ref 20–29)
Calcium: 8.9 mg/dL (ref 8.7–10.3)
Chloride: 108 mmol/L — ABNORMAL HIGH (ref 96–106)
Creatinine, Ser: 0.89 mg/dL (ref 0.57–1.00)
Glucose: 78 mg/dL (ref 70–99)
Potassium: 3.9 mmol/L (ref 3.5–5.2)
Sodium: 147 mmol/L — ABNORMAL HIGH (ref 134–144)
eGFR: 64 mL/min/{1.73_m2} (ref >59)

## 2022-11-05 NOTE — Telephone Encounter (Signed)
Patient is returning call to discuss lab results. 

## 2022-11-05 NOTE — Telephone Encounter (Signed)
Margie Billet, PA-C 11/05/2022  4:11 PM EST     Please tell patient that her potassium improved with supplementation and is within normal range. No medication changes necessary at this time. Follow up as scheduled.   Thanks! KJ    The patient has been notified of the result and verbalized understanding.  All questions (if any) were answered. Bernestine Amass, RN 11/05/2022 5:00 PM

## 2022-12-01 NOTE — Progress Notes (Unsigned)
Cardiology Office Note:    Date:  12/02/2022   ID:  Ezme, Glancy Nov 30, 1940, MRN [id number]  PCP:  System, Provider Not In  [facility] Cardiologist:  Sherren Mocha, MD    Referring MD: No ref. provider found   Patient Profile: Coronary artery disease S/p NSTEMI >>  CABG in 2009 Myoview 10/2018: ant-sept, apical infarct, no ischemia; low risk, EF 46 Chronic systolic CHF Ischemic cardiomyopathy Eval by Dr. 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 female with the above problem list.  She was last seen by Dr. Burt Knack in 01/2022. F/u TTE in 07/2022 demonstrated severely reduced LVF with EF 25-30. Given her advanced age and comorbid illnesses, it was decided medical Rx would be continued. She was last seen in clinic by Vikki Ports, PA-C 09/08/22. She was placed on Jardiance. She returns for f/u on CAD, CHF. She is here alone. She notes increasing urination since starting on Jardiance. Her weight is down 19 lbs since Nov 2023. She is still short of breath with exertion. This is unchanged. She has not had chest pain, syncope, orthopnea, leg edema.  EKG: NSR, HR 66, inferior and anteroseptal Q waves, T wave inversions 1, aVL, PVC, QTc 446, similar to prior tracings   Subjective    Reviewed and updated this encounter:  Tobacco  Allergies  Meds  Problems  Med Hx  Surg Hx  Fam Hx     ROS  Objective   Labs/Other Test Reviewed:   Recent Labs: 04/19/2022: ALT 8; Hemoglobin 16.7; Platelets  96 09/08/2022: TSH 3.050 11/04/2022: BUN 11; Creatinine, Ser 0.89; Potassium 3.9; Sodium 147   Recent Lipid Panel Recent Labs    04/20/22 0344  CHOL 88  TRIG 63  HDL 35*  VLDL 13  LDLCALC 40     Risk Assessment/Calculations/Metrics:             Physical Exam:   VS:  BP 92/60   Pulse 72   Ht 5' 5"$  (1.651 m)   Wt 164 lb 9.6 oz (74.7 kg)   SpO2 96%   BMI 27.39 kg/m    Wt Readings from Last 3 Encounters:  12/02/22 164 lb 9.6 oz (74.7 kg)  09/08/22 183 lb (83 kg)  05/21/22 182 lb 8 oz (82.8 kg)    Constitutional:      Appearance: Healthy appearance. Not in distress.  Neck:     Vascular: JVD normal.  Pulmonary:     Breath sounds: Normal breath sounds. No wheezing. No rales.  Cardiovascular:     Normal rate. Irregular rhythm. Normal S1. Normal S2.      Murmurs: There is no murmur.  Edema:    Peripheral edema absent.  Abdominal:     Palpations: Abdomen is soft.     Assessment & Plan    ASSESSMENT & PLAN:   Chronic HFrEF (heart failure with reduced ejection fraction) (HCC) EF 25-30 by echocardiogram in October 2023.  Given her advanced age, she is not considered a candidate for  ICD and is being managed medically.  She is currently NYHA IIb-III.  Overall, volume status appears stable.  Her weight is down significantly since starting on Jardiance.  Her blood pressure is also running low.  Luckily, she is not significantly symptomatic with this.   Decrease furosemide to 20 mg daily as needed for weight gain >3 pounds in 24 hours or edema + shortness of breath Take K+ 20 mEq with furosemide, as needed Decrease spironolactone to 12.5 mg daily BMET, magnesium today Continue carvedilol 12.5 mg twice daily, Jardiance 10 mg daily, isosorbide dinitrate/hydralazine 20/37.5 mg 3 times a day, Entresto 49/51 mg twice daily Monitor blood pressure.  I have asked her to contact us if her systolic pressure remains 100 or less. Follow-up 6 months  Coronary artery disease involving  native coronary artery of native heart without angina pectoris History of non-STEMI followed by CABG in 2009.  Myoview in 2020 with evidence of infarct but no ischemia.  She is not currently having chest discomfort to suggest angina.  Continue aspirin 81 mg, carvedilol 12.5 mg twice daily, Crestor 40 mg.  Hyperlipidemia LDL optimal on most recent lab work.  Continue current Rx with Crestor 40 mg daily.    Essential hypertension Blood pressure running low as outlined.  Adjust medications as noted.  COPD (chronic obstructive pulmonary disease) ([place]) She has chronic shortness of breath related to COPD.           Dispo:  Return in about 6 months (around 06/02/2023) for Routine Follow Up, w/ Dr. Burt Knack, or Richardson Dopp, PA-C.   Signed, Richardson Dopp, PA-C  12/02/2022 10:15 AM    [address], [place], [place]  [address] Phone: [phone]; Fax: [phone]

## 2022-12-02 ENCOUNTER — Ambulatory Visit: Payer: 59 | Attending: Physician Assistant | Admitting: Physician Assistant

## 2022-12-02 ENCOUNTER — Encounter: Payer: Self-pay | Admitting: Physician Assistant

## 2022-12-02 ENCOUNTER — Telehealth: Payer: Self-pay | Admitting: Physician Assistant

## 2022-12-02 VITALS — BP 92/60 | HR 72 | Ht 65.0 in | Wt 164.6 lb

## 2022-12-02 DIAGNOSIS — I2510 Atherosclerotic heart disease of native coronary artery without angina pectoris: Secondary | ICD-10-CM

## 2022-12-02 DIAGNOSIS — E782 Mixed hyperlipidemia: Secondary | ICD-10-CM | POA: Diagnosis not present

## 2022-12-02 DIAGNOSIS — E875 Hyperkalemia: Secondary | ICD-10-CM

## 2022-12-02 DIAGNOSIS — I5022 Chronic systolic (congestive) heart failure: Secondary | ICD-10-CM

## 2022-12-02 DIAGNOSIS — I10 Essential (primary) hypertension: Secondary | ICD-10-CM | POA: Diagnosis not present

## 2022-12-02 DIAGNOSIS — J449 Chronic obstructive pulmonary disease, unspecified: Secondary | ICD-10-CM

## 2022-12-02 LAB — BASIC METABOLIC PANEL
BUN/Creatinine Ratio: 17 (ref 12–28)
BUN: 22 mg/dL (ref 8–27)
CO2: 19 mmol/L — ABNORMAL LOW (ref 20–29)
Calcium: 9.3 mg/dL (ref 8.7–10.3)
Chloride: 104 mmol/L (ref 96–106)
Creatinine, Ser: 1.3 mg/dL — ABNORMAL HIGH (ref 0.57–1.00)
Glucose: 111 mg/dL — ABNORMAL HIGH (ref 70–99)
Potassium: 5.8 mmol/L (ref 3.5–5.2)
Sodium: 138 mmol/L (ref 134–144)
eGFR: 41 mL/min/{1.73_m2} — ABNORMAL LOW (ref >59)

## 2022-12-02 LAB — MAGNESIUM: Magnesium: 2.5 mg/dL — ABNORMAL HIGH (ref 1.6–2.3)

## 2022-12-02 MED ORDER — SPIRONOLACTONE 25 MG PO TABS: 12.5000 mg | ORAL_TABLET | Freq: Every day | ORAL | 3 refills | Status: DC

## 2022-12-02 MED ORDER — FUROSEMIDE 20 MG PO TABS: ORAL_TABLET | ORAL | 3 refills | Status: DC

## 2022-12-02 NOTE — Assessment & Plan Note (Signed)
She has chronic shortness of breath related to COPD.

## 2022-12-02 NOTE — Assessment & Plan Note (Signed)
History of non-STEMI followed by CABG in 2009.  Myoview in 2020 with evidence of infarct but no ischemia.  She is not currently having chest discomfort to suggest angina.  Continue aspirin 81 mg, carvedilol 12.5 mg twice daily, Crestor 40 mg.

## 2022-12-02 NOTE — Assessment & Plan Note (Signed)
LDL optimal on most recent lab work.  Continue current Rx with Crestor 40 mg daily.

## 2022-12-02 NOTE — Telephone Encounter (Signed)
Liana calling from [facility] with critical K+ of 5.8

## 2022-12-02 NOTE — Telephone Encounter (Signed)
[name] - [facility] calling to give critical result

## 2022-12-02 NOTE — Telephone Encounter (Signed)
Left detailed message for patient to call back and follow recommendations as listed below.   Per Richardson Dopp PA, stop potassium, hold spironolactone for 3 days, and come in for BMET in one week. Encouraged patient to call back with any questions or the repeat of instructions left on voicemail.

## 2022-12-02 NOTE — Patient Instructions (Signed)
Medication Instructions:  Your physician has recommended you make the following change in your medication:   REDUCE the Lasix to daily only as needed for weight gain of 3 pounds or more in 24 hours or if you have increased shortness of breath or edema  REDUCE the Spironolactone to 25 mg taking 1/2 tablet daily  CHANGE the Potassium to taking only when you have to take a Lasix   *If you need a refill on your cardiac medications before your next appointment, please call your pharmacy*   Lab Work: TODAY:  BMET & MAGNESIUM  If you have labs (blood work) drawn today and your tests are completely normal, you will receive your results only by: MyChart Message (if you have MyChart) OR A paper copy in the mail If you have any lab test that is abnormal or we need to change your treatment, we will call you to review the results.   Testing/Procedures: None ordered   Follow-Up: At [facility], you and your health needs are our priority.  As part of our continuing mission to provide you with exceptional heart care, we have created designated Provider Care Teams.  These Care Teams include your primary Cardiologist (physician) and Advanced Practice Providers (APPs -  Physician Assistants and Nurse Practitioners) who all work together to provide you with the care you need, when you need it.  We recommend signing up for the patient portal called "MyChart".  Sign up information is provided on this After Visit Summary.  MyChart is used to connect with patients for Virtual Visits (Telemedicine).  Patients are able to view lab/test results, encounter notes, upcoming appointments, etc.  Non-urgent messages can be sent to your provider as well.   To learn more about what you can do with MyChart, go to [email or web address].    Your next appointment:   6 month(s)  Provider:   Sherren Mocha, MD  or Richardson Dopp, PA-C         Other Instructions Your physician has requested that you regularly  monitor and record your blood pressure readings at home. Please use the same machine at the same time of day to check your readings and record them to bring to your follow-up visit.   Please monitor blood pressures and keep a log of your readings for 2 weeks.  If you TOP NUMBER IS LESS THAN OR EQUAL TO 100, CALL OUR OFFICE.    Make sure to check 2 hours after your medications.    AVOID these things for 30 minutes before checking your blood pressure: No Drinking caffeine. No Drinking alcohol. No Eating. No Smoking. No Exercising.   Five minutes before checking your blood pressure: Pee. Sit in a dining chair. Avoid sitting in a soft couch or armchair. Be quiet. Do not talk

## 2022-12-02 NOTE — Assessment & Plan Note (Signed)
EF 25-30 by echocardiogram in October 2023.  Given her advanced age, she is not considered a candidate for ICD and is being managed medically.  She is currently NYHA IIb-III.  Overall, volume status appears stable.  Her weight is down significantly since starting on Jardiance.  Her blood pressure is also running low.  Luckily, she is not significantly symptomatic with this.   Decrease furosemide to 20 mg daily as needed for weight gain >3 pounds in 24 hours or edema + shortness of breath Take K+ 20 mEq with furosemide, as needed Decrease spironolactone to 12.5 mg daily BMET, magnesium today Continue carvedilol 12.5 mg twice daily, Jardiance 10 mg daily, isosorbide dinitrate/hydralazine 20/37.5 mg 3 times a day, Entresto 49/51 mg twice daily Monitor blood pressure.  I have asked her to contact us if her systolic pressure remains 100 or less. Follow-up 6 months

## 2022-12-02 NOTE — Assessment & Plan Note (Signed)
Blood pressure running low as outlined.  Adjust medications as noted.

## 2022-12-03 ENCOUNTER — Telehealth: Payer: Self-pay | Admitting: Physician Assistant

## 2022-12-03 NOTE — Telephone Encounter (Signed)
Patient is returning call.  °

## 2022-12-03 NOTE — Telephone Encounter (Signed)
See previous note

## 2022-12-03 NOTE — Telephone Encounter (Signed)
Liliane Shi, PA-C 12/02/2022  5:59 PM EST     Creatinine increased. K+ elevated. PLAN: -Hold Spironolactone for now (do not restart in 3 days as previously recommended). -DC K+. -Take Lasix 20 mg only as needed for wt gain > 3 lbs in 1 day. -Hold Entresto for 1 day, then resume. -Hold Jardiance x 1 day, then resume. -BMET 1 week. Richardson Dopp, PA-C   12/02/2022 5:57 PM     Spoke with the patient and gave recommendations per [facility]. Patient verbalized understanding.

## 2022-12-08 ENCOUNTER — Ambulatory Visit: Payer: Medicare Other | Admitting: Physician Assistant

## 2022-12-09 ENCOUNTER — Ambulatory Visit: Payer: 59 | Attending: Physician Assistant

## 2022-12-09 DIAGNOSIS — E875 Hyperkalemia: Secondary | ICD-10-CM

## 2022-12-10 ENCOUNTER — Telehealth: Payer: Self-pay | Admitting: *Deleted

## 2022-12-10 DIAGNOSIS — Z79899 Other long term (current) drug therapy: Secondary | ICD-10-CM

## 2022-12-10 LAB — BASIC METABOLIC PANEL
BUN/Creatinine Ratio: 13 (ref 12–28)
BUN: 15 mg/dL (ref 8–27)
CO2: 22 mmol/L (ref 20–29)
Calcium: 9.1 mg/dL (ref 8.7–10.3)
Chloride: 104 mmol/L (ref 96–106)
Creatinine, Ser: 1.13 mg/dL — ABNORMAL HIGH (ref 0.57–1.00)
Glucose: 84 mg/dL (ref 70–99)
Potassium: 4.4 mmol/L (ref 3.5–5.2)
Sodium: 141 mmol/L (ref 134–144)
eGFR: 48 mL/min/{1.73_m2} — ABNORMAL LOW (ref >59)

## 2022-12-10 NOTE — Telephone Encounter (Signed)
-----   Message from Liliane Shi, PA-C sent at 12/10/2022  8:58 AM EST ----- Creatinine improved. K+ normal.  PLAN:  - Continue current medications  - Repeat BMET again in 2 weeks - Weigh daily and take Lasix 20 mg as needed for wt gain > 3 lbs in 1 day - If pt has to take Lasix due to wt gain, please notify us Richardson Dopp, PA-C    12/10/2022 8:56 AM

## 2022-12-23 ENCOUNTER — Ambulatory Visit: Payer: 59 | Attending: Physician Assistant

## 2022-12-23 DIAGNOSIS — Z79899 Other long term (current) drug therapy: Secondary | ICD-10-CM

## 2022-12-24 ENCOUNTER — Other Ambulatory Visit: Payer: 59

## 2022-12-24 LAB — BASIC METABOLIC PANEL
BUN/Creatinine Ratio: 12 (ref 12–28)
BUN: 12 mg/dL (ref 8–27)
CO2: 22 mmol/L (ref 20–29)
Calcium: 8.9 mg/dL (ref 8.7–10.3)
Chloride: 108 mmol/L — ABNORMAL HIGH (ref 96–106)
Creatinine, Ser: 1.03 mg/dL — ABNORMAL HIGH (ref 0.57–1.00)
Glucose: 88 mg/dL (ref 70–99)
Potassium: 4.1 mmol/L (ref 3.5–5.2)
Sodium: 144 mmol/L (ref 134–144)
eGFR: 54 mL/min/{1.73_m2} — ABNORMAL LOW (ref >59)

## 2023-03-05 ENCOUNTER — Other Ambulatory Visit: Payer: Self-pay

## 2023-03-05 DIAGNOSIS — I5022 Chronic systolic (congestive) heart failure: Secondary | ICD-10-CM

## 2023-03-05 MED ORDER — SPIRONOLACTONE 25 MG PO TABS: 12.5000 mg | ORAL_TABLET | Freq: Every day | ORAL | 3 refills | Status: AC

## 2023-03-05 MED ORDER — FUROSEMIDE 20 MG PO TABS: ORAL_TABLET | ORAL | 3 refills | Status: AC

## 2023-03-05 MED ORDER — CARVEDILOL 12.5 MG PO TABS: 12.5000 mg | ORAL_TABLET | Freq: Two times a day (BID) | ORAL | 3 refills | Status: DC

## 2023-03-05 MED ORDER — ENTRESTO 49-51 MG PO TABS
1.0000 | ORAL_TABLET | Freq: Two times a day (BID) | ORAL | 3 refills | Status: DC
Start: 2023-03-05 — End: 2023-05-10

## 2023-03-05 MED ORDER — EMPAGLIFLOZIN 10 MG PO TABS: 10.0000 mg | ORAL_TABLET | Freq: Every day | ORAL | 3 refills | Status: DC

## 2023-03-22 ENCOUNTER — Other Ambulatory Visit: Payer: Self-pay | Admitting: Cardiology

## 2023-04-24 ENCOUNTER — Other Ambulatory Visit: Payer: Self-pay | Admitting: Cardiovascular Disease

## 2023-05-08 ENCOUNTER — Other Ambulatory Visit: Payer: Self-pay | Admitting: Cardiovascular Disease

## 2023-05-08 DIAGNOSIS — I5022 Chronic systolic (congestive) heart failure: Secondary | ICD-10-CM

## 2023-05-10 ENCOUNTER — Other Ambulatory Visit: Payer: Self-pay | Admitting: Family Medicine

## 2023-05-10 DIAGNOSIS — R7989 Other specified abnormal findings of blood chemistry: Secondary | ICD-10-CM

## 2023-05-24 ENCOUNTER — Ambulatory Visit: Payer: 59 | Admitting: Neurology

## 2023-05-24 ENCOUNTER — Ambulatory Visit: Admission: RE | Admit: 2023-05-24 | Payer: 59 | Source: Ambulatory Visit

## 2023-05-24 DIAGNOSIS — R7989 Other specified abnormal findings of blood chemistry: Secondary | ICD-10-CM

## 2023-05-31 ENCOUNTER — Ambulatory Visit: Payer: 59 | Admitting: Cardiovascular Disease

## 2023-08-13 ENCOUNTER — Encounter (HOSPITAL_COMMUNITY): Payer: Self-pay

## 2023-08-13 ENCOUNTER — Other Ambulatory Visit: Payer: Self-pay

## 2023-08-13 ENCOUNTER — Emergency Department (HOSPITAL_COMMUNITY)
Admission: EM | Admit: 2023-08-13 | Discharge: 2023-08-13 | Disposition: A | Discharge status: Home / Self Care | Payer: 59 | Attending: Emergency Medicine | Admitting: Emergency Medicine

## 2023-08-13 DIAGNOSIS — R9431 Abnormal electrocardiogram [ECG] [EKG]: Secondary | ICD-10-CM | POA: Insufficient documentation

## 2023-08-13 DIAGNOSIS — J449 Chronic obstructive pulmonary disease, unspecified: Secondary | ICD-10-CM | POA: Diagnosis not present

## 2023-08-13 DIAGNOSIS — I2510 Atherosclerotic heart disease of native coronary artery without angina pectoris: Secondary | ICD-10-CM | POA: Insufficient documentation

## 2023-08-13 DIAGNOSIS — Z951 Presence of aortocoronary bypass graft: Secondary | ICD-10-CM | POA: Insufficient documentation

## 2023-08-13 LAB — I-STAT CHEM 8, ED
BUN: 24 mg/dL — ABNORMAL HIGH (ref 8–23)
Calcium, Ion: 1.21 mmol/L (ref 1.15–1.40)
Chloride: 108 mmol/L (ref 98–111)
Creatinine, Ser: 1.2 mg/dL — ABNORMAL HIGH (ref 0.44–1.00)
Glucose, Bld: 88 mg/dL (ref 70–99)
HCT: 48 % — ABNORMAL HIGH (ref 36.0–46.0)
Hemoglobin: 16.3 g/dL — ABNORMAL HIGH (ref 12.0–15.0)
Potassium: 4.1 mmol/L (ref 3.5–5.1)
Sodium: 146 mmol/L — ABNORMAL HIGH (ref 135–145)
TCO2: 25 mmol/L (ref 22–32)

## 2023-08-13 LAB — TROPONIN I (HIGH SENSITIVITY): Troponin I (High Sensitivity): 11 ng/L (ref <18)

## 2023-08-13 NOTE — ED Provider Notes (Signed)
[place] EMERGENCY DEPARTMENT AT [facility] Provider Note   CSN: [id number] Arrival date & time: 08/13/23  1301     History  Chief Complaint  Patient presents with   Abnormal ECG    [name] is a 82 y.o. female.  HPI 82 year old female history of coronary artery disease status post CABG 16 years ago presents today from primary care office.  Patient was there for standard workup and did not have any complaints.  She states the doctor there did an EKG and sent her here due to concerns sees on the EKG.  She presents with a EKG and hand.  This is reviewed and shows a normal sinus rhythm with a pause and some ST elevation in the lateral leads and reads acute MI.  Patient has previously had a left bundle branch block.  I have reviewed the EKG and think this is consistent with her left bundle branch block and not likely to be STEMI.  Patient is not having any symptoms at this time nor has she had any today.  Patient has history of COPD.  She is not having any dyspnea, fever, or chills     Home Medications Prior to Admission medications   Medication Sig Start Date End Date Taking? Authorizing Provider  acetaminophen-codeine (TYLENOL #2) 300-15 MG tablet Take 1 tablet by mouth 2 (two) times daily as needed for moderate pain. 12/18/21   [provider]  albuterol (PROVENTIL HFA;VENTOLIN HFA) 108 (90 BASE) MCG/ACT inhaler Inhale 1-2 puffs into the lungs every 6 (six) hours as needed for wheezing. 01/08/11   Clance, Maree Krabbe, MD  alendronate (FOSAMAX) 35 MG tablet Take 35 mg by mouth every 7 (seven) days. Take with a full glass of water on an empty stomach.    [provider]  BIDIL 20-37.5 MG tablet TAKE 1 TABLET BY MOUTH THREE TIMES DAILY 06/26/20   Tonny Bollman, MD  carvedilol (COREG) 12.5 MG tablet TAKE 1 TABLET(12.5 MG) BY MOUTH TWICE DAILY 04/26/23   Tereso Newcomer T, PA-C  ENTRESTO 49-51 MG TAKE 1 TABLET BY MOUTH TWICE DAILY 05/10/23   Tereso Newcomer T, PA-C   Fluticasone-Umeclidin-Vilant (TRELEGY ELLIPTA) 200-62.5-25 MCG/ACT AEPB Inhale 1 puff into the lungs daily.    [provider]  furosemide (LASIX) 20 MG tablet Take 1 tablet by mouth daily only as needed for weight gain of 3 lbs or more in 1 day or for increased shortness of breath or edema 03/05/23   Tereso Newcomer T, PA-C  JARDIANCE 10 MG TABS tablet TAKE 1 TABLET(10 MG) BY MOUTH DAILY BEFORE BREAKFAST 03/23/23   Tereso Newcomer T, PA-C  Multiple Vitamin (MULTIVITAMIN) tablet Take 1 tablet by mouth daily.    [provider]  nitroGLYCERIN (NITROSTAT) 0.4 MG SL tablet PLACE 1 TABLET UNDER TONGUE EVERY 5 MINUTES AS NEEDED FOR CHEST PAIN 07/14/16   Tonny Bollman, MD  Omega 3 1000 MG CAPS Take 1 capsule by mouth daily.    [provider]  rosuvastatin (CRESTOR) 40 MG tablet Take 1 tablet (40 mg total) by mouth daily. 01/07/22   Tonny Bollman, MD  spironolactone (ALDACTONE) 25 MG tablet Take 0.5 tablets (12.5 mg total) by mouth daily. 03/05/23   Tereso Newcomer T, PA-C  Turmeric (QC TUMERIC COMPLEX) 500 MG CAPS Take 1 capsule by mouth daily. Patient taking 400 mg daily Prn    [provider]      Allergies    Patient has no known allergies.  Review of Systems   Review of Systems  Physical Exam Updated Vital Signs BP (!) 164/87   Pulse (!) 56   Temp 98 F (36.7 C) (Oral)   Resp 19   Ht 1.651 m (5\' 5" )   Wt 78.9 kg   SpO2 98%   BMI 28.96 kg/m  Physical Exam Vitals and nursing note reviewed.  Constitutional:      Appearance: Normal appearance.  HENT:     Right Ear: External ear normal.     Left Ear: External ear normal.     Nose: Nose normal.     Mouth/Throat:     Pharynx: Oropharynx is clear.  Eyes:     Extraocular Movements: Extraocular movements intact.     Pupils: Pupils are equal, round, and reactive to light.  Cardiovascular:     Rate and Rhythm: Normal rate and regular rhythm.     Pulses: Normal pulses.  Abdominal:     General: Abdomen  is flat.  Musculoskeletal:        General: Normal range of motion.     Cervical back: Normal range of motion.  Skin:    Capillary Refill: Capillary refill takes less than 2 seconds.  Neurological:     General: No focal deficit present.     Mental Status: She is alert.  Psychiatric:        Mood and Affect: Mood normal.     ED Results / Procedures / Treatments   Labs (all labs ordered are listed, but only abnormal results are displayed) Labs Reviewed  I-STAT CHEM 8, ED - Abnormal; Notable for the following components:      Result Value   Sodium 146 (*)    BUN 24 (*)    Creatinine, Ser 1.20 (*)    Hemoglobin 16.3 (*)    HCT 48.0 (*)    All other components within normal limits  TROPONIN I (HIGH SENSITIVITY)    EKG EKG Interpretation Date/Time:  Friday August 13 2023 13:16:22 EDT Ventricular Rate:  86 PR Interval:  213 QRS Duration:  125 QT Interval:  413 QTC Calculation: 462 R Axis:   -64  Text Interpretation: Sinus rhythm Paired ventricular premature complexes Borderline prolonged PR interval Left bundle branch block Confirmed by Margarita Grizzle [phone]) on 08/13/2023 1:26:13 PM  Radiology No results found.  Procedures Procedures    Medications Ordered in ED Medications - No data to display  ED Course/ Medical Decision Making/ A&P Clinical Course as of 08/13/23 1601  Fri Aug 13, 2023  1559 Troponin reviewed interpreted and within normal limits [DR]    Clinical Course User Index [DR] Margarita Grizzle, MD                                 Medical Decision Making   82 year old female presents today from primary care office due to abnormal EKG.  This EKG is reviewed does show some ST elevation in her lateral leads however there is also a left bundle branch block.  Here that is left bundle branch block without any significant ST elevation.  Patient has not had any chest pain, shortness of breath, lightheadedness, upper abdominal discomfort or other symptoms that  would concern me that she was having an MI.  She is evaluated here with an i-STAT and with troponin as well as the EKG.  This appears to be stable for her.  Troponin is normal Patient does have some  mild hyponatremia.  Patient will be advised to have this followed up as outpatient.  Do not think she needs any further inpatient evaluation.  Appears to be stable for discharge        Final Clinical Impression(s) / ED Diagnoses Final diagnoses:  Abnormal EKG    Rx / DC Orders ED Discharge Orders     None         Margarita Grizzle, MD 08/13/23 1601

## 2023-08-13 NOTE — Discharge Instructions (Signed)
You were evaluated here in the emergency department today for an abnormal EKG.  Your EKG here appears to be stable from prior. You were evaluated with heart enzyme which is normal. Please follow-up with your primary care doctor. Please return to the emergency department if you are having any new or worsening symptoms.

## 2023-08-13 NOTE — ED Triage Notes (Signed)
Per EMS and pt report, pt was sent here from Dr. Margaretmary Eddy office for an unusual EKG. Pt was being seen in the office for a check up and some indigestion after eating spicy foods last night. Was concerned from new arrythmia.

## 2023-10-25 ENCOUNTER — Inpatient Hospital Stay: Payer: 59

## 2023-10-25 ENCOUNTER — Inpatient Hospital Stay: Payer: 59 | Attending: Hematology and Oncology | Admitting: Hematology and Oncology

## 2023-10-28 ENCOUNTER — Ambulatory Visit: Payer: 59 | Admitting: Neurology

## 2023-11-04 ENCOUNTER — Ambulatory Visit: Payer: 59 | Attending: Cardiovascular Disease | Admitting: Cardiovascular Disease

## 2023-11-04 ENCOUNTER — Encounter: Payer: Self-pay | Admitting: Cardiovascular Disease

## 2023-11-04 VITALS — BP 140/90 | HR 80 | Ht 65.0 in | Wt 171.8 lb

## 2023-11-04 DIAGNOSIS — I10 Essential (primary) hypertension: Secondary | ICD-10-CM

## 2023-11-04 DIAGNOSIS — I2510 Atherosclerotic heart disease of native coronary artery without angina pectoris: Secondary | ICD-10-CM | POA: Diagnosis not present

## 2023-11-04 DIAGNOSIS — I5022 Chronic systolic (congestive) heart failure: Secondary | ICD-10-CM

## 2023-11-04 DIAGNOSIS — E782 Mixed hyperlipidemia: Secondary | ICD-10-CM

## 2023-11-04 NOTE — Assessment & Plan Note (Signed)
No angina ever since her bypass surgery.  Continue current management.  She takes aspirin 81 mg daily.

## 2023-11-04 NOTE — Assessment & Plan Note (Signed)
The patient is clinically stable and will remain on carvedilol, empagliflozin, isosorbide dinitrate hydralazine (BiDil), spironolactone, and Entresto.  She has no evidence of volume overload on exam.  Continue current management.  She appears to have no significant functional limitation related to her cardiac status at this time.

## 2023-11-04 NOTE — Assessment & Plan Note (Signed)
Blood pressure is controlled on her current medical therapy.  Recent creatinine is 1.2 and potassium is 4.1.

## 2023-11-04 NOTE — Patient Instructions (Signed)
Follow-Up: At [facility], you and your health needs are our priority.  As part of our continuing mission to provide you with exceptional heart care, we have created designated Provider Care Teams.  These Care Teams include your primary Cardiologist (physician) and Advanced Practice Providers (APPs -  Physician Assistants and Nurse Practitioners) who all work together to provide you with the care you need, when you need it.  Your next appointment:   6 month(s)  Provider:   Tereso Newcomer, PA-C     Then, Tonny Bollman, MD will plan to see you again in 1 year(s).       1st Floor: - Lobby - Registration  - Pharmacy  - Lab - Cafe  2nd Floor: - PV Lab - Diagnostic Testing (echo, CT, nuclear med)  3rd Floor: - Vacant  4th Floor: - TCTS (cardiothoracic surgery) - AFib Clinic - Structural Heart Clinic - Vascular Surgery  - Vascular Ultrasound  5th Floor: - HeartCare Cardiology (general and EP) - Clinical Pharmacy for coumadin, hypertension, lipid, weight-loss medications, and med management appointments    Valet parking services will be available as well.

## 2023-11-04 NOTE — Assessment & Plan Note (Signed)
Treated with rosuvastatin.  Cholesterol is 88, LDL 40.

## 2023-11-04 NOTE — Progress Notes (Signed)
Cardiology Office Note:    Date:  11/04/2023   ID:  [name] [birth year], MRN [id number]  PCP:  Ellyn Hack, MD   [facility] Providers Cardiologist:  Tonny Bollman, MD     Referring MD: No ref. provider found   Chief Complaint  Patient presents with   Shortness of Breath    History of Present Illness:    [name] is a 83 y.o. female with a hx of:  Coronary artery disease S/p NSTEMI >>  CABG in 2009 Myoview 10/2018: ant-sept, apical infarct, no ischemia; low risk, EF 46 Chronic systolic CHF Ischemic cardiomyopathy Eval by Dr. 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  She has had a difficult year as she has lost 2 of her children.  She has 2 living children, both daughters.  She has not had any recent problems with chest pain, chest pressure, or shortness of breath.  She ambulates with a cane because of hip problems.  She has no dizziness, near-syncope, or heart palpitations.  She was sent to the emergency room in October because of an abnormal EKG but she was asymptomatic at the time.  Her troponins were negative and she was discharged home.  She has had no other concerns.  Current Medications: Current Meds  Medication Sig   acetaminophen-codeine (TYLENOL #2) 300-15 MG tablet Take 1 tablet by mouth 2 (two) times daily as needed for moderate pain.   albuterol (PROVENTIL HFA;VENTOLIN HFA) 108 (90 BASE) MCG/ACT inhaler Inhale 1-2 puffs into the lungs every 6 (six) hours as needed for wheezing.    alendronate (FOSAMAX) 35 MG tablet Take 35 mg by mouth every 7 (seven) days. Take with a full glass of water on an empty stomach.   BIDIL 20-37.5 MG tablet TAKE 1 TABLET BY MOUTH THREE TIMES DAILY   carvedilol (COREG) 12.5 MG tablet TAKE 1 TABLET(12.5 MG) BY MOUTH TWICE DAILY   ENTRESTO 49-51 MG TAKE 1 TABLET BY MOUTH TWICE DAILY   Fluticasone-Umeclidin-Vilant (TRELEGY ELLIPTA) 200-62.5-25 MCG/ACT AEPB Inhale 1 puff into the lungs daily.   furosemide (LASIX) 20 MG tablet Take 1 tablet by mouth daily only as needed for weight gain of 3 lbs or more in 1 day or for increased shortness of breath or edema (Patient taking differently: Take 20 mg by mouth as needed. Take 1 tablet by mouth daily only as needed for weight gain of 3 lbs or more in 1 day or for increased shortness of breath or edema.Per patient taking 10 mg as needed)   JARDIANCE 10 MG TABS tablet TAKE 1 TABLET(10 MG) BY MOUTH DAILY BEFORE BREAKFAST   Multiple Vitamin (MULTIVITAMIN) tablet Take 1 tablet by mouth daily.   nitroGLYCERIN (NITROSTAT) 0.4 MG SL tablet PLACE 1 TABLET UNDER TONGUE EVERY 5 MINUTES AS NEEDED FOR CHEST PAIN   Omega 3 1000 MG CAPS Take 1 capsule by mouth daily.   rosuvastatin (CRESTOR) 40 MG tablet Take 1 tablet (40 mg total) by mouth daily.   spironolactone (ALDACTONE) 25 MG tablet Take 0.5  tablets (12.5 mg total) by mouth daily.   Turmeric (QC TUMERIC COMPLEX) 500 MG CAPS Take 1 capsule by mouth daily. Patient taking 400 mg daily Prn     Allergies:   Patient has no known allergies.   ROS:   Please see the history of present illness.    All other systems reviewed and are negative.  EKGs/Labs/Other Studies Reviewed:    The following studies were reviewed today: Cardiac Studies & Procedures     STRESS TESTS  MYOCARDIAL PERFUSION IMAGING 11/03/2018  Narrative  The left ventricular ejection fraction is mildly decreased (45-54%).  Nuclear stress EF: 46%.  There was no ST segment deviation noted during  stress.  Defect 1: There is a large defect of severe severity present in the mid anteroseptal, apical anterior, apical septal, apical inferior, apical lateral and apex location.  This is a low risk study.  Findings consistent with prior myocardial infarction.  Abnormal, low risk stress nuclear study with prior anteroseptal and apical infarct; no ischemia; EF 46 with akinesis of the apex.  ECHOCARDIOGRAM  ECHOCARDIOGRAM COMPLETE 08/04/2022  Narrative ECHOCARDIOGRAM REPORT    Patient Name:   [name] Date of Exam: 08/04/2022 Medical Rec #:  [id number]     Height:       65.0 in Accession #:    [id number]    Weight:       182.5 lb Date of Birth:  1941/10/06     BSA:          1.903 m Patient Age:    40 years      BP:           131/68 mmHg Patient Gender: F             HR:           64 bpm. Exam Location:  [address]  Procedure: 2D Echo, 3D Echo, Cardiac Doppler, Color Doppler and Intracardiac Opacification Agent  Indications:    I50.22 CHF  History:        Patient has prior history of Echocardiogram examinations, most recent 04/20/2022. CAD and Previous Myocardial Infarction, Prior CABG, COPD, Arrythmias:PVC; Risk Factors:Hypertension and HLD.  Sonographer:    Clearence Ped RCS Referring Phys: [phone] [name]  IMPRESSIONS   1. Left ventricular ejection fraction, by estimation, is 25 to 30%. The left ventricle has severely decreased function. The left ventricle demonstrates regional wall motion abnormalities (see scoring diagram/findings for description). There is mild concentric left ventricular hypertrophy. Left ventricular diastolic parameters are indeterminate. 2. Right ventricular systolic function is mildly reduced. The right ventricular size is normal. There is normal pulmonary artery systolic pressure. 3. The mitral valve is grossly normal. Trivial mitral valve regurgitation. No evidence of mitral stenosis. 4. The aortic valve is grossly normal. There is mild  calcification of the aortic valve. There is mild thickening of the aortic valve. Aortic valve regurgitation is not visualized. Aortic valve sclerosis is present, with no evidence of aortic valve stenosis. 5. The inferior vena cava is normal in size with <50% respiratory variability, suggesting right atrial pressure of 8 mmHg. 6. Cannot exclude a small PFO.  Comparison(s): No significant change from prior study.  Conclusion(s)/Recommendation(s): No left ventricular mural or apical thrombus/thrombi.  FINDINGS Left Ventricle: Left ventricular ejection fraction, by estimation, is 25 to 30%. The left ventricle has severely decreased function. The left ventricle demonstrates regional wall motion abnormalities. Definity contrast agent was given IV to delineate the left ventricular endocardial borders. 3D left  ventricular ejection fraction analysis performed but not reported based on interpreter judgement due to suboptimal tracking. The left ventricular internal cavity size was normal in size. There is mild concentric left ventricular hypertrophy. Left ventricular diastolic parameters are indeterminate.   LV Wall Scoring: The mid and distal anterior septum, mid inferoseptal segment, apical anterior segment, apical inferior segment, and apex are akinetic. The anterior wall, entire lateral wall, inferior wall, basal anteroseptal segment, and basal inferoseptal segment are hypokinetic.  Right Ventricle: The right ventricular size is normal. Right vetricular wall thickness was not well visualized. Right ventricular systolic function is mildly reduced. There is normal pulmonary artery systolic pressure. The tricuspid regurgitant velocity is 2.41 m/s, and with an assumed right atrial pressure of 8 mmHg, the estimated right ventricular systolic pressure is 31.2 mmHg.  Left Atrium: Left atrial size was normal in size.  Right Atrium: Right atrial size was normal in size.  Pericardium: There is no evidence  of pericardial effusion.  Mitral Valve: The mitral valve is grossly normal. Trivial mitral valve regurgitation. No evidence of mitral valve stenosis.  Tricuspid Valve: The tricuspid valve is normal in structure. Tricuspid valve regurgitation is trivial. No evidence of tricuspid stenosis.  Aortic Valve: The aortic valve is grossly normal. There is mild calcification of the aortic valve. There is mild thickening of the aortic valve. Aortic valve regurgitation is not visualized. Aortic valve sclerosis is present, with no evidence of aortic valve stenosis.  Pulmonic Valve: The pulmonic valve was not well visualized. Pulmonic valve regurgitation is not visualized. No evidence of pulmonic stenosis.  Aorta: The aortic root, ascending aorta, aortic arch and descending aorta are all structurally normal, with no evidence of dilitation or obstruction.  Venous: The inferior vena cava is normal in size with less than 50% respiratory variability, suggesting right atrial pressure of 8 mmHg.  IAS/Shunts: Cannot exclude a small PFO.   LEFT VENTRICLE PLAX 2D LVIDd:         4.50 cm   Diastology LVIDs:         3.20 cm   LV e' medial:    5.66 cm/s LV PW:         1.20 cm   LV E/e' medial:  9.8 LV IVS:        1.20 cm   LV e' lateral:   10.40 cm/s LVOT diam:     2.00 cm   LV E/e' lateral: 5.3 LV SV:         46 LV SV Index:   24 LVOT Area:     3.14 cm  3D Volume EF: 3D EF:        50 % LV EDV:       136 ml LV ESV:       68 ml LV SV:        67 ml  RIGHT VENTRICLE RV Basal diam:  3.40 cm RV S prime:     8.81 cm/s TAPSE (M-mode): 1.3 cm RVSP:           26.2 mmHg  LEFT ATRIUM             Index        RIGHT ATRIUM           Index LA diam:        3.90 cm 2.05 cm/m   RA Pressure: 3.00 mmHg LA Vol (A2C):   44.5 ml 23.39 ml/m  RA Area:     11.50 cm LA Vol (A4C):  29.9 ml 15.72 ml/m  RA Volume:   24.40 ml  12.82 ml/m LA Biplane Vol: 35.9 ml 18.87 ml/m AORTIC VALVE LVOT Vmax:   60.30 cm/s LVOT  Vmean:  43.400 cm/s LVOT VTI:    0.146 m  AORTA Ao Root diam: 3.50 cm Ao Asc diam:  3.40 cm  MITRAL VALVE               TRICUSPID VALVE MV Area (PHT):             TR Peak grad:   23.2 mmHg MV Decel Time:             TR Vmax:        241.00 cm/s MV E velocity: 55.50 cm/s  Estimated RAP:  3.00 mmHg MV A velocity: 91.70 cm/s  RVSP:           26.2 mmHg MV E/A ratio:  0.61 SHUNTS Systemic VTI:  0.15 m Systemic Diam: 2.00 cm  Jodelle Red MD Electronically signed by Jodelle Red MD Signature Date/Time: 08/04/2022/3:16:01 PM    Final   MONITORS  LONG TERM MONITOR (3-14 DAYS) 02/17/2022  Narrative Patch Wear Time:  2 days and 8 hours (2023-04-21T09:22:23-0400 to 2023-04-23T18:04:59-0400)  Patient had a min HR of 54 bpm, max HR of 160 bpm, and avg HR of 78 bpm. Predominant underlying rhythm was Sinus Rhythm. 2 Ventricular Tachycardia runs occurred, the run with the fastest interval lasting 4 beats with a max rate of 156 bpm, the longest lasting 5 beats with an avg rate of 108 bpm. 2 Supraventricular Tachycardia runs occurred, the run with the fastest interval lasting 5 beats with a max rate of 160 bpm, the longest lasting 6 beats with an avg rate of 151 bpm. Isolated SVEs were frequent (5.1%, 12152), SVE Couplets were occasional (2.0%, 2332), and SVE Triplets were occasional (1.0%, 798). Isolated VEs were frequent (9.4%, 22531), VE Couplets were occasional (1.1%, 1338), and VE Triplets were rare (<1.0%, 111). Ventricular Bigeminy and Trigeminy were present.  SUMMARY: The basic rhythm is normal sinus with an average HR of 78 bpm. There are frequent PVC's occurring at a burden of 9.4%. There are rare, short ventricular and supraventricular runs last 4-6 beats. No sustained arrhythmia. No AFib or AFlutter. No pauses or bradycardic events.           EKG:        Recent Labs: 12/02/2022: Magnesium 2.5 08/13/2023: BUN 24; Creatinine, Ser 1.20; Hemoglobin 16.3; Potassium  4.1; Sodium 146  Recent Lipid Panel    Component Value Date/Time   CHOL 88 04/20/2022 0344   CHOL 98 (L) 02/02/2022 1047   TRIG 63 04/20/2022 0344   HDL 35 (L) 04/20/2022 0344   HDL 41 02/02/2022 1047   CHOLHDL 2.5 04/20/2022 0344   VLDL 13 04/20/2022 0344   LDLCALC 40 04/20/2022 0344   LDLCALC 40 02/02/2022 1047         Physical Exam:    VS:  BP (!) 140/90   Pulse 80   Ht 5\' 5"  (1.651 m)   Wt 171 lb 12.8 oz (77.9 kg)   SpO2 95%   BMI 28.59 kg/m     Wt Readings from Last 3 Encounters:  11/04/23 171 lb 12.8 oz (77.9 kg)  08/13/23 174 lb (78.9 kg)  12/02/22 164 lb 9.6 oz (74.7 kg)     GEN:  Well nourished, well developed in no acute distress HEENT: Normal NECK: No JVD; No carotid bruits LYMPHATICS: No lymphadenopathy CARDIAC:  RRR, no murmurs, rubs, gallops RESPIRATORY:  Clear to auscultation without rales, wheezing or rhonchi  ABDOMEN: Soft, non-tender, non-distended MUSCULOSKELETAL:  No edema; No deformity  SKIN: Warm and dry NEUROLOGIC:  Alert and oriented x 3 PSYCHIATRIC:  Normal affect   Assessment & Plan Chronic HFrEF (heart failure with reduced ejection fraction) (HCC) The patient is clinically stable and will remain on carvedilol, empagliflozin, isosorbide dinitrate hydralazine (BiDil), spironolactone, and Entresto.  She has no evidence of volume overload on exam.  Continue current management.  She appears to have no significant functional limitation related to her cardiac status at this time. Mixed hyperlipidemia Treated with rosuvastatin.  Cholesterol is 88, LDL 40. Coronary artery disease involving native coronary artery of native heart without angina pectoris No angina ever since her bypass surgery.  Continue current management.  She takes aspirin 81 mg daily. Essential hypertension Blood pressure is controlled on her current medical therapy.  Recent creatinine is 1.2 and potassium is 4.1.     Medication Adjustments/Labs and Tests Ordered: Current  medicines are reviewed at length with the patient today.  Concerns regarding medicines are outlined above.  No orders of the defined types were placed in this encounter.  No orders of the defined types were placed in this encounter.   Patient Instructions  Follow-Up: At [facility], you and your health needs are our priority.  As part of our continuing mission to provide you with exceptional heart care, we have created designated Provider Care Teams.  These Care Teams include your primary Cardiologist (physician) and Advanced Practice Providers (APPs -  Physician Assistants and Nurse Practitioners) who all work together to provide you with the care you need, when you need it.  Your next appointment:   6 month(s)  Provider:   Tereso Newcomer, PA-C     Then, Tonny Bollman, MD will plan to see you again in 1 year(s).       1st Floor: - Lobby - Registration  - Pharmacy  - Lab - Cafe  2nd Floor: - PV Lab - Diagnostic Testing (echo, CT, nuclear med)  3rd Floor: - Vacant  4th Floor: - TCTS (cardiothoracic surgery) - AFib Clinic - Structural Heart Clinic - Vascular Surgery  - Vascular Ultrasound  5th Floor: - HeartCare Cardiology (general and EP) - Clinical Pharmacy for coumadin, hypertension, lipid, weight-loss medications, and med management appointments    Valet parking services will be available as well.          Signed, Tonny Bollman, MD  11/04/2023 4:52 PM    [facility]

## 2023-11-19 ENCOUNTER — Telehealth: Payer: Self-pay

## 2023-11-19 NOTE — Telephone Encounter (Signed)
Called to remind her of appt on Monday. She requested to cancel appts due to being sick. She is going to see PCP for cold/respiratory issue. Appts canceled.

## 2023-11-22 ENCOUNTER — Encounter: Payer: 59 | Admitting: Hematology and Oncology

## 2023-11-22 ENCOUNTER — Other Ambulatory Visit: Payer: 59

## 2023-12-08 ENCOUNTER — Other Ambulatory Visit: Payer: Self-pay | Admitting: Physician Assistant

## 2024-02-06 ENCOUNTER — Emergency Department (HOSPITAL_COMMUNITY)

## 2024-02-06 ENCOUNTER — Emergency Department (HOSPITAL_COMMUNITY)
Admission: EM | Admit: 2024-02-06 | Discharge: 2024-02-06 | Disposition: A | Discharge status: Home / Self Care | Attending: Emergency Medicine | Admitting: Emergency Medicine

## 2024-02-06 ENCOUNTER — Encounter (HOSPITAL_COMMUNITY): Payer: Self-pay | Admitting: Emergency Medicine

## 2024-02-06 DIAGNOSIS — Z79899 Other long term (current) drug therapy: Secondary | ICD-10-CM | POA: Insufficient documentation

## 2024-02-06 DIAGNOSIS — R42 Dizziness and giddiness: Secondary | ICD-10-CM | POA: Insufficient documentation

## 2024-02-06 LAB — URINALYSIS, ROUTINE W REFLEX MICROSCOPIC
Bacteria, UA: NONE SEEN
Bilirubin Urine: NEGATIVE
Glucose, UA: >=500 mg/dL — AB
Hgb urine dipstick: NEGATIVE
Ketones, ur: NEGATIVE mg/dL
Leukocytes,Ua: NEGATIVE
Nitrite: NEGATIVE
Protein, ur: 100 mg/dL — AB
Specific Gravity, Urine: 1.022 (ref 1.005–1.030)
pH: 5 (ref 5.0–8.0)

## 2024-02-06 LAB — CBC WITH DIFFERENTIAL/PLATELET
Abs Immature Granulocytes: 0.04 10*3/uL (ref 0.00–0.07)
Basophils Absolute: 0 10*3/uL (ref 0.0–0.1)
Basophils Relative: 1 %
Eosinophils Absolute: 0.1 10*3/uL (ref 0.0–0.5)
Eosinophils Relative: 1 %
HCT: 50.3 % — ABNORMAL HIGH (ref 36.0–46.0)
Hemoglobin: 15.1 g/dL — ABNORMAL HIGH (ref 12.0–15.0)
Immature Granulocytes: 1 %
Lymphocytes Relative: 14 %
Lymphs Abs: 1.2 10*3/uL (ref 0.7–4.0)
MCH: 27.5 pg (ref 26.0–34.0)
MCHC: 30 g/dL (ref 30.0–36.0)
MCV: 91.5 fL (ref 80.0–100.0)
Monocytes Absolute: 0.6 10*3/uL (ref 0.1–1.0)
Monocytes Relative: 7 %
Neutro Abs: 6.8 10*3/uL (ref 1.7–7.7)
Neutrophils Relative %: 76 %
Platelets: 95 10*3/uL — ABNORMAL LOW (ref 150–400)
RBC: 5.5 MIL/uL — ABNORMAL HIGH (ref 3.87–5.11)
RDW: 13.4 % (ref 11.5–15.5)
WBC: 8.7 10*3/uL (ref 4.0–10.5)
nRBC: 0 % (ref 0.0–0.2)

## 2024-02-06 LAB — COMPREHENSIVE METABOLIC PANEL WITH GFR
ALT: 12 U/L (ref 0–44)
AST: 17 U/L (ref 15–41)
Albumin: 3.3 g/dL — ABNORMAL LOW (ref 3.5–5.0)
Alkaline Phosphatase: 58 U/L (ref 38–126)
Anion gap: 7 (ref 5–15)
BUN: 12 mg/dL (ref 8–23)
CO2: 24 mmol/L (ref 22–32)
Calcium: 8.6 mg/dL — ABNORMAL LOW (ref 8.9–10.3)
Chloride: 108 mmol/L (ref 98–111)
Creatinine, Ser: 0.97 mg/dL (ref 0.44–1.00)
GFR, Estimated: 58 mL/min — ABNORMAL LOW (ref >60)
Glucose, Bld: 108 mg/dL — ABNORMAL HIGH (ref 70–99)
Potassium: 4.3 mmol/L (ref 3.5–5.1)
Sodium: 139 mmol/L (ref 135–145)
Total Bilirubin: 0.9 mg/dL (ref 0.0–1.2)
Total Protein: 6.5 g/dL (ref 6.5–8.1)

## 2024-02-06 NOTE — Discharge Instructions (Signed)
 Return if any problems.

## 2024-02-06 NOTE — ED Triage Notes (Signed)
 Pt here from home with c/o dizziness for the last 2 hours stroke workup negative for ems

## 2024-02-06 NOTE — ED Provider Notes (Signed)
  EMERGENCY DEPARTMENT AT [facility] Provider Note   CSN: [id number] Arrival date & time: 02/06/24  2054     History  Chief Complaint  Patient presents with   Dizziness    [name] is a 83 y.o. female.  Patient complains of dizziness.  Patient reports that she has had symptoms for over a year.  Patient reports that she had an episode of dizziness today.  Patient reports that she does not feel that she is as mentally alert as in the past.  Patient reports she has been able to ambulate today.  She has been able to do her normal activities.  Patient states she was doing a crossword earlier today  Patient denies any weakness in any extremities she denies any fever or chills.  She has not had any cough patient denies any discomfort with urination.  The history is provided by the patient. No language interpreter was used.  Dizziness Quality:  Vertigo Severity:  Moderate Timing:  Constant Progression:  Worsening Chronicity:  New Relieved by:  Nothing Worsened by:  Nothing Ineffective treatments:  None tried Associated symptoms: no nausea        Home Medications Prior to Admission medications   Medication Sig Start Date End Date Taking? Authorizing Provider  acetaminophen -codeine  (TYLENOL  #2) 300-15 MG tablet Take 1 tablet by mouth 2 (two) times daily as needed for moderate pain. 12/18/21   [provider]  albuterol  (PROVENTIL  HFA;VENTOLIN  HFA) 108 (90 BASE) MCG/ACT inhaler Inhale 1-2 puffs into the lungs every 6 (six) hours as needed for wheezing. 01/08/11   Clance, Deena Farrier, MD  alendronate (FOSAMAX) 35 MG tablet Take 35 mg by mouth every 7 (seven) days. Take with a full glass of water on an empty stomach.    [provider]  BIDIL 20-37.5 MG tablet TAKE 1 TABLET BY MOUTH THREE TIMES DAILY 06/26/20   Arnoldo Lapping, MD  carvedilol  (COREG ) 12.5 MG tablet TAKE 1 TABLET(12.5 MG) BY MOUTH TWICE DAILY 04/26/23   Marlyse Single T, PA-C  empagliflozin   (JARDIANCE ) 10 MG TABS tablet TAKE 1 TABLET BY MOUTH DAILY  BEFORE BREAKFAST 12/09/23   Arnoldo Lapping, MD  ENTRESTO  49-51 MG TAKE 1 TABLET BY MOUTH TWICE DAILY 05/10/23   Marlyse Single T, PA-C  Fluticasone-Umeclidin-Vilant (TRELEGY ELLIPTA) 200-62.5-25 MCG/ACT AEPB Inhale 1 puff into the lungs daily.    [provider]  furosemide  (LASIX ) 20 MG tablet Take 1 tablet by mouth daily only as needed for weight gain of 3 lbs or more in 1 day or for increased shortness of breath or edema Patient taking differently: Take 20 mg by mouth as needed. Take 1 tablet by mouth daily only as needed for weight gain of 3 lbs or more in 1 day or for increased shortness of breath or edema.Per patient taking 10 mg as needed 03/05/23   Marlyse Single T, PA-C  Multiple Vitamin (MULTIVITAMIN) tablet Take 1 tablet by mouth daily.    [provider]  nitroGLYCERIN  (NITROSTAT ) 0.4 MG SL tablet PLACE 1 TABLET UNDER TONGUE EVERY 5 MINUTES AS NEEDED FOR CHEST PAIN 07/14/16   Arnoldo Lapping, MD  Omega 3 1000 MG CAPS Take 1 capsule by mouth daily.    [provider]  rosuvastatin  (CRESTOR ) 40 MG tablet Take 1 tablet (40 mg total) by mouth daily. 01/07/22   Arnoldo Lapping, MD  spironolactone  (ALDACTONE ) 25 MG tablet Take 0.5 tablets (12.5 mg total) by mouth daily. 03/05/23   Gabino Joe,  PA-C  Turmeric (QC TUMERIC COMPLEX) 500 MG CAPS Take 1 capsule by mouth daily. Patient taking 400 mg daily Prn    [provider]      Allergies    Patient has no known allergies.    Review of Systems   Review of Systems  Gastrointestinal:  Negative for nausea.  Neurological:  Positive for dizziness.  All other systems reviewed and are negative.   Physical Exam Updated Vital Signs There were no vitals taken for this visit. Physical Exam Vitals and nursing note reviewed.  Constitutional:      Appearance: She is well-developed.  HENT:     Head: Normocephalic.     Right Ear: Tympanic membrane normal.      Left Ear: Tympanic membrane normal.     Nose: Nose normal.     Mouth/Throat:     Mouth: Mucous membranes are moist.  Eyes:     Extraocular Movements: Extraocular movements intact.     Conjunctiva/sclera: Conjunctivae normal.     Pupils: Pupils are equal, round, and reactive to light.  Cardiovascular:     Rate and Rhythm: Normal rate.  Pulmonary:     Effort: Pulmonary effort is normal.  Abdominal:     General: There is no distension.  Musculoskeletal:        General: Normal range of motion.     Cervical back: Normal range of motion.  Skin:    General: Skin is warm.  Neurological:     General: No focal deficit present.     Mental Status: She is alert and oriented to person, place, and time.  Psychiatric:        Mood and Affect: Mood normal.     ED Results / Procedures / Treatments   Labs (all labs ordered are listed, but only abnormal results are displayed) Labs Reviewed  CBC WITH DIFFERENTIAL/PLATELET - Abnormal; Notable for the following components:      Result Value   RBC 5.50 (*)    Hemoglobin 15.1 (*)    HCT 50.3 (*)    Platelets 95 (*)    All other components within normal limits  COMPREHENSIVE METABOLIC PANEL WITH GFR - Abnormal; Notable for the following components:   Glucose, Bld 108 (*)    Calcium  8.6 (*)    Albumin 3.3 (*)    GFR, Estimated 58 (*)    All other components within normal limits  URINALYSIS, ROUTINE W REFLEX MICROSCOPIC - Abnormal; Notable for the following components:   Glucose, UA >=500 (*)    Protein, ur 100 (*)    All other components within normal limits    EKG None  Radiology No results found.  Procedures Procedures    Medications Ordered in ED Medications - No data to display  ED Course/ Medical Decision Making/ A&P                                 Medical Decision Making Patient complains of dizziness on and off for the past year.  Patient's family reports that they have noticed her having more dizziness for the  past week.  Amount and/or Complexity of Data Reviewed Labs: ordered. Decision-making details documented in ED Course.    Details: Labs ordered reviewed and interpreted.  UA is negative.  White blood cell count is normal.+Patient's GFR is 58.  17 Radiology: ordered and independent interpretation performed. Decision-making details documented in ED Course.  Details: CT head shows no acute findings.  Chronic cerebral atrophy  Risk Risk Details:  I discussed symptoms with patient and granddaughter.  They report patient has good family support.  Someone will be staying with her.  Patient is requesting a new walker.  She states that her walker does not work very well.  I advised follow-up with primary care physician return to the emergency department if any problems.          Final Clinical Impression(s) / ED Diagnoses Final diagnoses:  Dizziness    Rx / DC Orders ED Discharge Orders     None      An After Visit Summary was printed and given to the patient.    Jaasiel Hollyfield K, PA-C 02/06/24 2344    Trish Furl, MD 02/07/24 1034

## 2024-02-07 ENCOUNTER — Telehealth: Payer: Self-pay

## 2024-02-07 NOTE — Telephone Encounter (Signed)
 Received inbound call from patient and her daughter Gretta Leavens [phone] who reports she was told during ED visit on last night that a prescription for DME: rolling walker will be sent to [facility]. 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  Patient's daughter reports patient had a fall one week ago and having dizziness. Per chart review patient was seen at [facility] for weakness. DME :rolling walker order is on file. This RNCM offered DME choice to patient and daughter, who chose any DME provider that accepts insurance.     This RNCM spoke with Jermaine with [employer] who will follow patient in the community to deliver DME: rolling walker to patient's address on file.

## 2024-10-26 ENCOUNTER — Encounter: Payer: Self-pay | Admitting: Cardiovascular Disease

## 2024-11-06 ENCOUNTER — Encounter: Payer: Self-pay | Admitting: Cardiovascular Disease

## 2024-11-06 ENCOUNTER — Ambulatory Visit: Admitting: Cardiovascular Disease

## 2024-11-06 VITALS — BP 140/78 | HR 90 | Ht 65.0 in | Wt 176.8 lb

## 2024-11-06 DIAGNOSIS — I10 Essential (primary) hypertension: Secondary | ICD-10-CM

## 2024-11-06 DIAGNOSIS — I5022 Chronic systolic (congestive) heart failure: Secondary | ICD-10-CM | POA: Diagnosis not present

## 2024-11-06 DIAGNOSIS — E782 Mixed hyperlipidemia: Secondary | ICD-10-CM

## 2024-11-06 DIAGNOSIS — I2510 Atherosclerotic heart disease of native coronary artery without angina pectoris: Secondary | ICD-10-CM | POA: Diagnosis not present

## 2024-11-06 MED ORDER — CARVEDILOL 12.5 MG PO TABS: 12.5000 mg | ORAL_TABLET | Freq: Two times a day (BID) | ORAL | 3 refills | Status: AC

## 2024-11-06 MED ORDER — EMPAGLIFLOZIN 10 MG PO TABS: 10.0000 mg | ORAL_TABLET | Freq: Every day | ORAL | 3 refills | Status: AC

## 2024-11-06 NOTE — Assessment & Plan Note (Addendum)
 Patient with remote CABG.  She takes aspirin  81 mg daily.  Continue beta-blocker, isosorbide, and statin drug as tolerated.

## 2024-11-06 NOTE — Patient Instructions (Signed)
 Medication Instructions:  No medication changes were made at this visit. Continue current regimen.   *If you need a refill on your cardiac medications before your next appointment, please call your pharmacy*  Lab Work: None ordered today. If you have labs (blood work) drawn today and your tests are completely normal, you will receive your results only by: MyChart Message (if you have MyChart) OR A paper copy in the mail If you have any lab test that is abnormal or we need to change your treatment, we will call you to review the results.  Testing/Procedures: None ordered today.  Follow-Up: At [facility], you and your health needs are our priority.  As part of our continuing mission to provide you with exceptional heart care, our providers are all part of one team.  This team includes your primary Cardiologist (physician) and Advanced Practice Providers or APPs (Physician Assistants and Nurse Practitioners) who all work together to provide you with the care you need, when you need it.  Your next appointment:   6 month(s)  Provider:   Glendia Ferrier, PA-C     Then, Ozell Fell, MD will plan to see you again in 1 year(s).

## 2024-11-06 NOTE — Assessment & Plan Note (Addendum)
 Blood pressure well-controlled on multidrug therapy.  Continue current management.

## 2024-11-06 NOTE — Assessment & Plan Note (Addendum)
 GDMT includes carvedilol , empagliflozin , Isordil, spironolactone , and Entresto .  Euvolemic on exam.  Will arrange 44-month follow-up with Glendia Ferrier.

## 2024-11-06 NOTE — Progress Notes (Signed)
 " Cardiology Office Note:    Date:  11/06/2024   ID:  [name], [name] [birth year], MRN [id number]  PCP:  [name] Leni Edyth DELENA, MD   [facility] Providers Cardiologist:  Ozell Fell, MD     Referring MD: [name] Leni Edyth DELENA, MD   Chief Complaint  Patient presents with   Coronary Artery Disease    History of Present Illness:    [name] is a 84 y.o. female with a hx of:  Coronary artery disease S/p NSTEMI >>  CABG in 2009 Myoview  10/2018: ant-sept, apical infarct, no ischemia; low risk, EF 46 Chronic systolic CHF Ischemic cardiomyopathy Eval by Dr. 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  She is doing quite well.  States that she spent a month out in [place] with her daughter last year.  She has another daughter living locally in [place] and has multiple grandchildren and great-grandchildren here.  She reports a good support system.  She denies chest pain, chest pressure, edema, heart palpitations, orthopnea, or PND.  She is compliant with her medications.  She follows regularly with Dr. Maree.  States that she had labs done last month.  She ambulates with a cane or a walker.  Current Medications: Active Medications[1]   Allergies:   Patient has no known allergies.   ROS:   Please see the history of present illness.    All other systems reviewed and are negative.  EKGs/Labs/Other Studies Reviewed:    The following studies were reviewed today: Cardiac Studies & Procedures    ______________________________________________________________________________________________   STRESS TESTS  MYOCARDIAL PERFUSION IMAGING 11/03/2018  Interpretation Summary  The left ventricular ejection fraction is mildly decreased (45-54%).  Nuclear stress EF: 46%.  There was no ST segment deviation noted during stress.  Defect 1: There is a large defect of severe severity present in the mid anteroseptal, apical anterior, apical septal, apical inferior, apical lateral and apex location.  This is a low risk study.  Findings consistent with prior myocardial infarction.  Abnormal, low risk stress nuclear study with prior anteroseptal and apical infarct; no ischemia; EF 46 with akinesis of the apex.   ECHOCARDIOGRAM  ECHOCARDIOGRAM COMPLETE 08/04/2022  Narrative ECHOCARDIOGRAM REPORT    Patient Name:   [name] Date of Exam: 08/04/2022 Medical Rec #:  [id number]     Height:       65.0 in Accession #:    [id number]    Weight:       182.5 lb Date of Birth:  01-13-1941     BSA:          1.903 m Patient Age:    81 years      BP:           131/68 mmHg Patient Gender: F             HR:           64 bpm. Exam Location:  [address]  Procedure: 2D Echo, 3D Echo, Cardiac Doppler, Color Doppler and Intracardiac Opacification Agent  Indications:  I50.22 CHF  History:        Patient has prior history of Echocardiogram examinations, most recent 04/20/2022. CAD and Previous Myocardial Infarction, Prior CABG, COPD, Arrythmias:PVC; Risk Factors:Hypertension and HLD.  Sonographer:    Waldo Guadalajara RCS Referring Phys: [phone] Traivon Morrical  IMPRESSIONS   1. Left ventricular ejection fraction, by estimation, is 25 to 30%. The left ventricle has severely decreased function. The left ventricle demonstrates regional wall motion abnormalities (see scoring diagram/findings for description). There is mild concentric left ventricular hypertrophy. Left ventricular diastolic  parameters are indeterminate. 2. Right ventricular systolic function is mildly reduced. The right ventricular size is normal. There is normal pulmonary artery systolic pressure. 3. The mitral valve is grossly normal. Trivial mitral valve regurgitation. No evidence of mitral stenosis. 4. The aortic valve is grossly normal. There is mild calcification of the aortic valve. There is mild thickening of the aortic valve. Aortic valve regurgitation is not visualized. Aortic valve sclerosis is present, with no evidence of aortic valve stenosis. 5. The inferior vena cava is normal in size with <50% respiratory variability, suggesting right atrial pressure of 8 mmHg. 6. Cannot exclude a small PFO.  Comparison(s): No significant change from prior study.  Conclusion(s)/Recommendation(s): No left ventricular mural or apical thrombus/thrombi.  FINDINGS Left Ventricle: Left ventricular ejection fraction, by estimation, is 25 to 30%. The left ventricle has severely decreased function. The left ventricle demonstrates regional wall motion abnormalities. Definity  contrast agent was given IV to delineate the left ventricular endocardial borders. 3D left ventricular ejection fraction analysis performed but not reported based on interpreter judgement due to suboptimal tracking. The left ventricular internal cavity size was normal in size. There is mild concentric left ventricular hypertrophy. Left ventricular diastolic parameters are indeterminate.   LV Wall Scoring: The mid and distal anterior septum, mid inferoseptal segment, apical anterior segment, apical inferior segment, and apex are akinetic. The anterior wall, entire lateral wall, inferior wall, basal anteroseptal segment, and basal inferoseptal segment are hypokinetic.  Right Ventricle: The right ventricular size is normal. Right vetricular wall thickness was not well visualized. Right ventricular systolic function is mildly reduced. There is normal  pulmonary artery systolic pressure. The tricuspid regurgitant velocity is 2.41 m/s, and with an assumed right atrial pressure of 8 mmHg, the estimated right ventricular systolic pressure is 31.2 mmHg.  Left Atrium: Left atrial size was normal in size.  Right Atrium: Right atrial size was normal in size.  Pericardium: There is no evidence of pericardial effusion.  Mitral Valve: The mitral valve is grossly normal. Trivial mitral valve regurgitation. No evidence of mitral valve stenosis.  Tricuspid Valve: The tricuspid valve is normal in structure. Tricuspid valve regurgitation is trivial. No evidence of tricuspid stenosis.  Aortic Valve: The aortic valve is grossly normal. There is mild calcification of the aortic valve. There is mild thickening of the aortic valve. Aortic valve regurgitation is not visualized. Aortic valve sclerosis is present, with no evidence of aortic valve stenosis.  Pulmonic Valve: The pulmonic valve was not well visualized. Pulmonic valve regurgitation is not visualized. No evidence of pulmonic stenosis.  Aorta: The aortic root, ascending aorta, aortic arch and descending aorta are all structurally normal, with no evidence of dilitation or obstruction.  Venous: The inferior vena cava is normal in size with less than 50% respiratory variability, suggesting right atrial pressure of 8 mmHg.  IAS/Shunts: Cannot exclude a small PFO.   LEFT VENTRICLE PLAX 2D LVIDd:         4.50  cm   Diastology LVIDs:         3.20 cm   LV e' medial:    5.66 cm/s LV PW:         1.20 cm   LV E/e' medial:  9.8 LV IVS:        1.20 cm   LV e' lateral:   10.40 cm/s LVOT diam:     2.00 cm   LV E/e' lateral: 5.3 LV SV:         46 LV SV Index:   24 LVOT Area:     3.14 cm  3D Volume EF: 3D EF:        50 % LV EDV:       136 ml LV ESV:       68 ml LV SV:        67 ml  RIGHT VENTRICLE RV Basal diam:  3.40 cm RV S prime:     8.81 cm/s TAPSE (M-mode): 1.3 cm RVSP:           26.2  mmHg  LEFT ATRIUM             Index        RIGHT ATRIUM           Index LA diam:        3.90 cm 2.05 cm/m   RA Pressure: 3.00 mmHg LA Vol (A2C):   44.5 ml 23.39 ml/m  RA Area:     11.50 cm LA Vol (A4C):   29.9 ml 15.72 ml/m  RA Volume:   24.40 ml  12.82 ml/m LA Biplane Vol: 35.9 ml 18.87 ml/m AORTIC VALVE LVOT Vmax:   60.30 cm/s LVOT Vmean:  43.400 cm/s LVOT VTI:    0.146 m  AORTA Ao Root diam: 3.50 cm Ao Asc diam:  3.40 cm  MITRAL VALVE               TRICUSPID VALVE MV Area (PHT):             TR Peak grad:   23.2 mmHg MV Decel Time:             TR Vmax:        241.00 cm/s MV E velocity: 55.50 cm/s  Estimated RAP:  3.00 mmHg MV A velocity: 91.70 cm/s  RVSP:           26.2 mmHg MV E/A ratio:  0.61 SHUNTS Systemic VTI:  0.15 m Systemic Diam: 2.00 cm  Shelda Bruckner MD Electronically signed by Shelda Bruckner MD Signature Date/Time: 08/04/2022/3:16:01 PM    Final    MONITORS  LONG TERM MONITOR (3-14 DAYS) 02/17/2022  Narrative Patch Wear Time:  2 days and 8 hours (2023-04-21T09:22:23-0400 to 2023-04-23T18:04:59-0400)  Patient had a min HR of 54 bpm, max HR of 160 bpm, and avg HR of 78 bpm. Predominant underlying rhythm was Sinus Rhythm. 2 Ventricular Tachycardia runs occurred, the run with the fastest interval lasting 4 beats with a max rate of 156 bpm, the longest lasting 5 beats with an avg rate of 108 bpm. 2 Supraventricular Tachycardia runs occurred, the run with the fastest interval lasting 5 beats with a max rate of 160 bpm, the longest lasting 6 beats with an avg rate of 151 bpm. Isolated SVEs were frequent (5.1%, 12152), SVE Couplets were occasional (2.0%, 2332), and SVE Triplets were occasional (1.0%, 798). Isolated VEs were frequent (9.4%, 22531), VE Couplets were occasional (1.1%, 1338), and VE Triplets were rare (<  1.0%, 111). Ventricular Bigeminy and Trigeminy were present.  SUMMARY: The basic rhythm is normal sinus with an average HR of 78  bpm. There are frequent PVC's occurring at a burden of 9.4%. There are rare, short ventricular and supraventricular runs last 4-6 beats. No sustained arrhythmia. No AFib or AFlutter. No pauses or bradycardic events.       ______________________________________________________________________________________________      EKG:        Recent Labs: 02/06/2024: ALT 12; BUN 12; Creatinine, Ser 0.97; Hemoglobin 15.1; Platelets 95; Potassium 4.3; Sodium 139  Recent Lipid Panel    Component Value Date/Time   CHOL 88 04/20/2022 0344   CHOL 98 (L) 02/02/2022 1047   TRIG 63 04/20/2022 0344   HDL 35 (L) 04/20/2022 0344   HDL 41 02/02/2022 1047   CHOLHDL 2.5 04/20/2022 0344   VLDL 13 04/20/2022 0344   LDLCALC 40 04/20/2022 0344   LDLCALC 40 02/02/2022 1047        Physical Exam:    VS:  BP (!) 140/78 (BP Location: Left Arm)   Pulse 90   Ht 5' 5 (1.651 m)   Wt 176 lb 12.8 oz (80.2 kg)   SpO2 94%   BMI 29.42 kg/m     Wt Readings from Last 3 Encounters:  11/06/24 176 lb 12.8 oz (80.2 kg)  11/04/23 171 lb 12.8 oz (77.9 kg)  08/13/23 174 lb (78.9 kg)     GEN: Elderly woman in no acute distress HEENT: Normal NECK: No JVD; No carotid bruits LYMPHATICS: No lymphadenopathy CARDIAC: RRR, no murmurs, rubs, gallops RESPIRATORY:  Clear to auscultation without rales, wheezing or rhonchi  ABDOMEN: Soft, non-tender, non-distended MUSCULOSKELETAL:  No edema; No deformity  SKIN: Warm and dry NEUROLOGIC:  Alert and oriented x 3 PSYCHIATRIC:  Normal affect   Assessment & Plan Chronic HFrEF (heart failure with reduced ejection fraction) (HCC) GDMT includes carvedilol , empagliflozin , Isordil, spironolactone , and Entresto .  Euvolemic on exam.  Will arrange 62-month follow-up with Glendia Ferrier. Mixed hyperlipidemia Patient treated with rosuvastatin .  LDL goal less than 70 mg/dL. Coronary artery disease involving native coronary artery of native heart without angina pectoris Patient with remote  CABG.  She takes aspirin  81 mg daily.  Continue beta-blocker, isosorbide, and statin drug as tolerated. Essential hypertension Blood pressure well-controlled on multidrug therapy.  Continue current management.     Medication Adjustments/Labs and Tests Ordered: Current medicines are reviewed at length with the patient today.  Concerns regarding medicines are outlined above.  No orders of the defined types were placed in this encounter.  Meds ordered this encounter  Medications   empagliflozin  (JARDIANCE ) 10 MG TABS tablet    Sig: Take 1 tablet (10 mg total) by mouth daily before breakfast.    Dispense:  90 tablet    Refill:  3   carvedilol  (COREG ) 12.5 MG tablet    Sig: Take 1 tablet (12.5 mg total) by mouth 2 (two) times daily with a meal.    Dispense:  180 tablet    Refill:  3    Patient Instructions  Medication Instructions:  No medication changes were made at this visit. Continue current regimen.   *If you need a refill on your cardiac medications before your next appointment, please call your pharmacy*  Lab Work: None ordered today. If you have labs (blood work) drawn today and your tests are completely normal, you will receive your results only by: MyChart Message (if you have MyChart) OR A paper copy in the mail  If you have any lab test that is abnormal or we need to change your treatment, we will call you to review the results.  Testing/Procedures: None ordered today.  Follow-Up: At [facility], you and your health needs are our priority.  As part of our continuing mission to provide you with exceptional heart care, our providers are all part of one team.  This team includes your primary Cardiologist (physician) and Advanced Practice Providers or APPs (Physician Assistants and Nurse Practitioners) who all work together to provide you with the care you need, when you need it.  Your next appointment:   6 month(s)  Provider:   Glendia Ferrier, PA-C         Then, Ozell Fell, MD will plan to see you again in 1 year(s).     Signed, Ozell Fell, MD  11/06/2024 4:13 PM    [facility]     [1]  Current Meds  Medication Sig   acetaminophen  (TYLENOL ) 500 MG tablet Take 500 mg by mouth every 4 (four) hours as needed for moderate pain (pain score 4-6).   acetaminophen -codeine  (TYLENOL  #2) 300-15 MG tablet Take 1 tablet by mouth 2 (two) times daily as needed for moderate pain.   albuterol  (PROVENTIL  HFA;VENTOLIN  HFA) 108 (90 BASE) MCG/ACT inhaler Inhale 1-2 puffs into the lungs every 6 (six) hours as needed for wheezing.   alendronate (FOSAMAX) 35 MG tablet Take 35 mg by mouth every 7 (seven) days. Take with a full glass of water on an empty stomach.   BIDIL 20-37.5 MG tablet TAKE 1 TABLET BY MOUTH THREE TIMES DAILY   ENTRESTO  49-51 MG TAKE 1 TABLET BY MOUTH TWICE DAILY   Fluticasone-Umeclidin-Vilant (TRELEGY ELLIPTA) 200-62.5-25 MCG/ACT AEPB Inhale 1 puff into the lungs daily.   furosemide  (LASIX ) 20 MG tablet Take 1 tablet by mouth daily only as needed for weight gain of 3 lbs or more in 1 day or for increased shortness of breath or edema (Patient taking differently: Take 20 mg by mouth as needed. Take 1 tablet by mouth daily only as needed for weight gain of 3 lbs or more in 1 day or for increased shortness of breath or edema.Per patient taking 10 mg as needed)   Multiple Vitamin (MULTIVITAMIN) tablet Take 1 tablet by mouth daily.   nitroGLYCERIN  (NITROSTAT ) 0.4 MG SL tablet PLACE 1 TABLET UNDER TONGUE EVERY 5 MINUTES AS NEEDED FOR CHEST PAIN   Omega 3 1000 MG CAPS Take 1 capsule by mouth daily.   rosuvastatin  (CRESTOR ) 40 MG tablet Take 1 tablet (40 mg total) by mouth daily.   spironolactone  (ALDACTONE ) 25 MG tablet Take 0.5 tablets (12.5 mg total) by mouth daily.   Turmeric (QC TUMERIC COMPLEX) 500 MG CAPS Take 1 capsule by mouth daily. Patient taking 400 mg daily Prn   [DISCONTINUED] carvedilol  (COREG ) 12.5 MG tablet TAKE 1  TABLET(12.5 MG) BY MOUTH TWICE DAILY   [DISCONTINUED] empagliflozin  (JARDIANCE ) 10 MG TABS tablet TAKE 1 TABLET BY MOUTH DAILY  BEFORE BREAKFAST   "

## 2024-11-06 NOTE — Assessment & Plan Note (Addendum)
 Patient treated with rosuvastatin .  LDL goal less than 70 mg/dL.
